# Patient Record
Sex: Male | Born: 1970
Health system: Southern US, Community
[De-identification: ages and names within clinical notes are randomized; demographics above are authoritative.]

## PROBLEM LIST (undated history)

## (undated) DIAGNOSIS — Z87438 Personal history of other diseases of male genital organs: Secondary | ICD-10-CM

## (undated) DIAGNOSIS — C801 Malignant (primary) neoplasm, unspecified: Secondary | ICD-10-CM

## (undated) DIAGNOSIS — J302 Other seasonal allergic rhinitis: Secondary | ICD-10-CM

## (undated) DIAGNOSIS — R112 Nausea with vomiting, unspecified: Secondary | ICD-10-CM

## (undated) DIAGNOSIS — Z8052 Family history of malignant neoplasm of bladder: Secondary | ICD-10-CM

## (undated) DIAGNOSIS — Z9889 Other specified postprocedural states: Secondary | ICD-10-CM

## (undated) DIAGNOSIS — Z807 Family history of other malignant neoplasms of lymphoid, hematopoietic and related tissues: Secondary | ICD-10-CM

## (undated) HISTORY — DX: Personal history of other diseases of male genital organs: Z87.438

## (undated) HISTORY — DX: Family history of malignant neoplasm of bladder: Z80.52

## (undated) HISTORY — DX: Other seasonal allergic rhinitis: J30.2

## (undated) HISTORY — PX: VASECTOMY: SHX75

## (undated) HISTORY — PX: COLONOSCOPY: SHX174

## (undated) HISTORY — DX: Family history of other malignant neoplasms of lymphoid, hematopoietic and related tissues: Z80.7

## (undated) HISTORY — PX: WISDOM TOOTH EXTRACTION: SHX21

---

## 2004-07-31 ENCOUNTER — Ambulatory Visit: Payer: Self-pay | Admitting: Internal Medicine

## 2007-06-06 ENCOUNTER — Ambulatory Visit: Payer: Self-pay | Admitting: Internal Medicine

## 2008-04-30 ENCOUNTER — Ambulatory Visit: Payer: Self-pay | Admitting: Internal Medicine

## 2008-05-03 LAB — CONVERTED CEMR LAB
ALT: 20 units/L (ref 0–53)
AST: 20 units/L (ref 0–37)
Albumin: 4.6 g/dL (ref 3.5–5.2)
Alkaline Phosphatase: 55 units/L (ref 39–117)
BUN: 12 mg/dL (ref 6–23)
Basophils Absolute: 0 10*3/uL (ref 0.0–0.1)
Basophils Relative: 0.1 % (ref 0.0–3.0)
Bilirubin, Direct: 0.1 mg/dL (ref 0.0–0.3)
CO2: 30 meq/L (ref 19–32)
Calcium: 9.8 mg/dL (ref 8.4–10.5)
Chloride: 104 meq/L (ref 96–112)
Cholesterol: 158 mg/dL (ref 0–200)
Creatinine, Ser: 0.9 mg/dL (ref 0.4–1.5)
Eosinophils Absolute: 0.2 10*3/uL (ref 0.0–0.7)
Eosinophils Relative: 4.6 % (ref 0.0–5.0)
GFR calc Af Amer: 123 mL/min
GFR calc non Af Amer: 101 mL/min
Glucose, Bld: 94 mg/dL (ref 70–99)
HCT: 44.5 % (ref 39.0–52.0)
HDL: 32.4 mg/dL — ABNORMAL LOW (ref >39.0)
Hemoglobin: 15.6 g/dL (ref 13.0–17.0)
LDL Cholesterol: 109 mg/dL — ABNORMAL HIGH (ref 0–99)
Lymphocytes Relative: 35.6 % (ref 12.0–46.0)
MCHC: 35.1 g/dL (ref 30.0–36.0)
MCV: 88.7 fL (ref 78.0–100.0)
Monocytes Absolute: 0.4 10*3/uL (ref 0.1–1.0)
Monocytes Relative: 8 % (ref 3.0–12.0)
Neutro Abs: 2.4 10*3/uL (ref 1.4–7.7)
Neutrophils Relative %: 51.7 % (ref 43.0–77.0)
Platelets: 213 10*3/uL (ref 150–400)
Potassium: 4.4 meq/L (ref 3.5–5.1)
RBC: 5.02 M/uL (ref 4.22–5.81)
RDW: 11.7 % (ref 11.5–14.6)
Sodium: 140 meq/L (ref 135–145)
TSH: 1.44 microintl units/mL (ref 0.35–5.50)
Total Bilirubin: 0.9 mg/dL (ref 0.3–1.2)
Total CHOL/HDL Ratio: 4.9
Total Protein: 7.4 g/dL (ref 6.0–8.3)
Triglycerides: 83 mg/dL (ref 0–149)
VLDL: 17 mg/dL (ref 0–40)
WBC: 4.7 10*3/uL (ref 4.5–10.5)

## 2008-05-04 ENCOUNTER — Encounter (INDEPENDENT_AMBULATORY_CARE_PROVIDER_SITE_OTHER): Payer: Self-pay | Admitting: *Deleted

## 2009-05-05 ENCOUNTER — Ambulatory Visit: Payer: Self-pay | Admitting: Internal Medicine

## 2009-05-05 DIAGNOSIS — IMO0002 Reserved for concepts with insufficient information to code with codable children: Secondary | ICD-10-CM | POA: Insufficient documentation

## 2009-09-03 DIAGNOSIS — Z87438 Personal history of other diseases of male genital organs: Secondary | ICD-10-CM

## 2009-09-03 HISTORY — DX: Personal history of other diseases of male genital organs: Z87.438

## 2010-02-20 ENCOUNTER — Ambulatory Visit: Payer: Self-pay | Admitting: Internal Medicine

## 2010-02-20 DIAGNOSIS — G479 Sleep disorder, unspecified: Secondary | ICD-10-CM | POA: Insufficient documentation

## 2010-02-24 ENCOUNTER — Ambulatory Visit: Payer: Self-pay | Admitting: Internal Medicine

## 2010-02-24 DIAGNOSIS — R1032 Left lower quadrant pain: Secondary | ICD-10-CM | POA: Insufficient documentation

## 2010-02-24 DIAGNOSIS — N453 Epididymo-orchitis: Secondary | ICD-10-CM | POA: Insufficient documentation

## 2010-02-24 LAB — CONVERTED CEMR LAB
Bilirubin Urine: NEGATIVE
Blood in Urine, dipstick: NEGATIVE
Glucose, Urine, Semiquant: NEGATIVE
Ketones, urine, test strip: NEGATIVE
Nitrite: NEGATIVE
Protein, U semiquant: NEGATIVE
Specific Gravity, Urine: 1.01
Urobilinogen, UA: 0.2
WBC Urine, dipstick: NEGATIVE
pH: 6

## 2010-03-01 ENCOUNTER — Telehealth (INDEPENDENT_AMBULATORY_CARE_PROVIDER_SITE_OTHER): Payer: Self-pay | Admitting: *Deleted

## 2010-03-02 ENCOUNTER — Encounter: Admission: RE | Admit: 2010-03-02 | Discharge: 2010-03-02 | Payer: Self-pay | Admitting: Internal Medicine

## 2010-03-02 IMAGING — US US SCROTUM
1 series · 14 of 25 positions shown · non-contrast
Comparison: None.

CLINICAL DATA: History of left epididymitis, currently on
antibiotics

ULTRASOUND OF SCROTUM
TECHNIQUE: Complete ultrasound examination of the testicles,
epididymis, and other scrotal structures was performed.

[Series 1: us scrotum · 0.08mm/px · 14 of 33 slices shown]
[im 1/33]
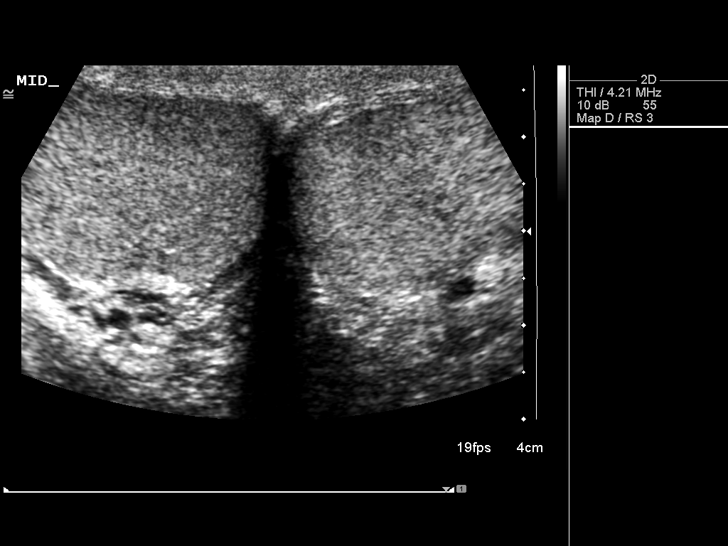
[im 3/33]
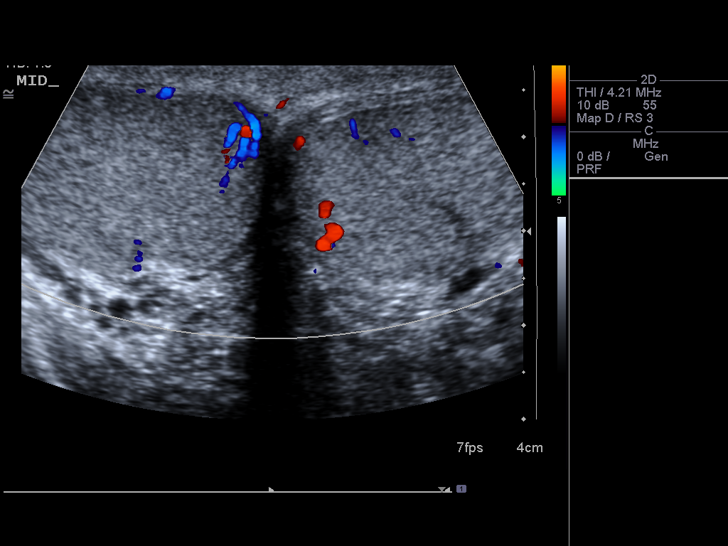
[im 6/33]
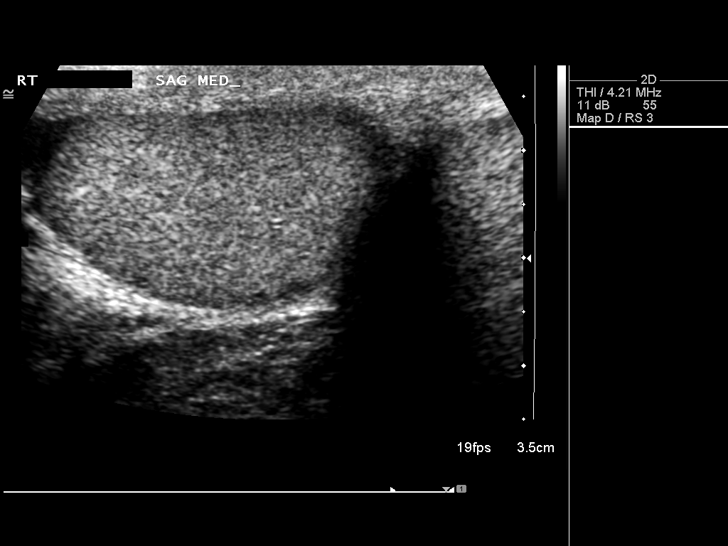
[im 9/33]
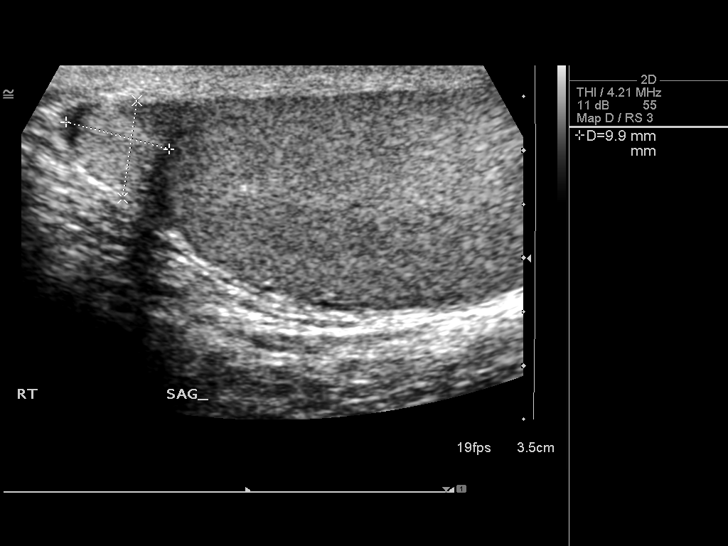
[im 11/33]
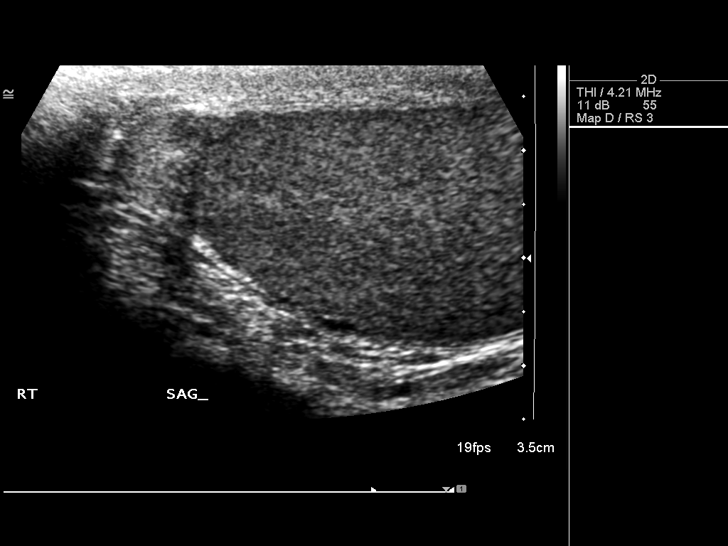
[im 13/33]
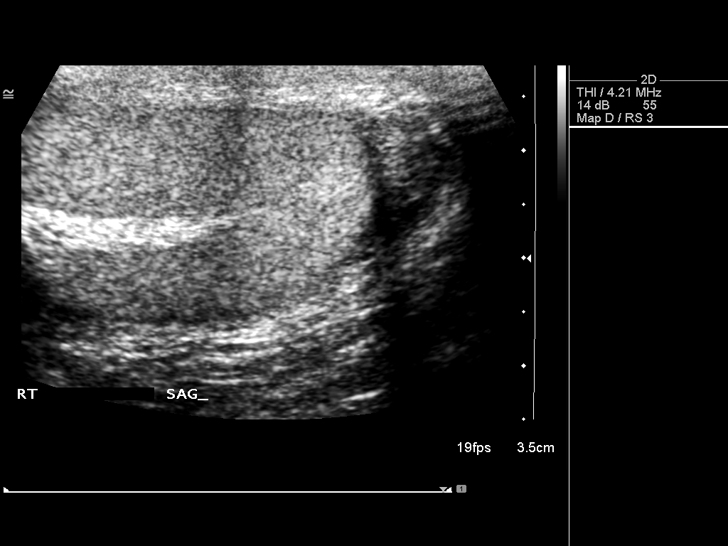
[im 15/33]
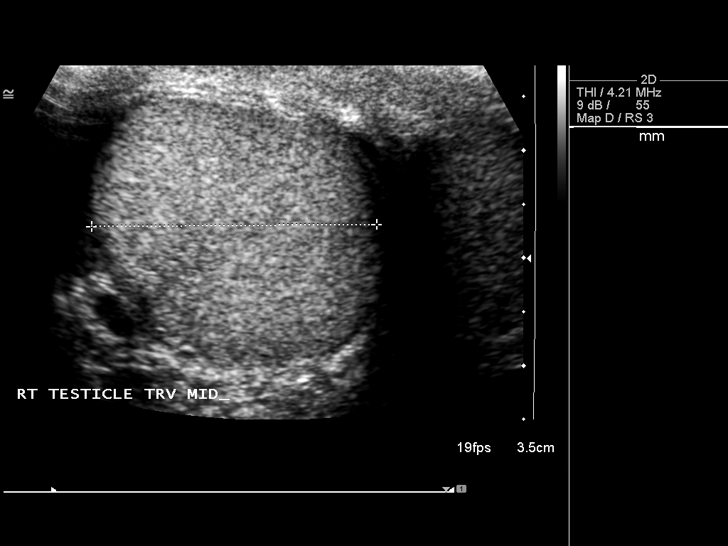
[im 18/33]
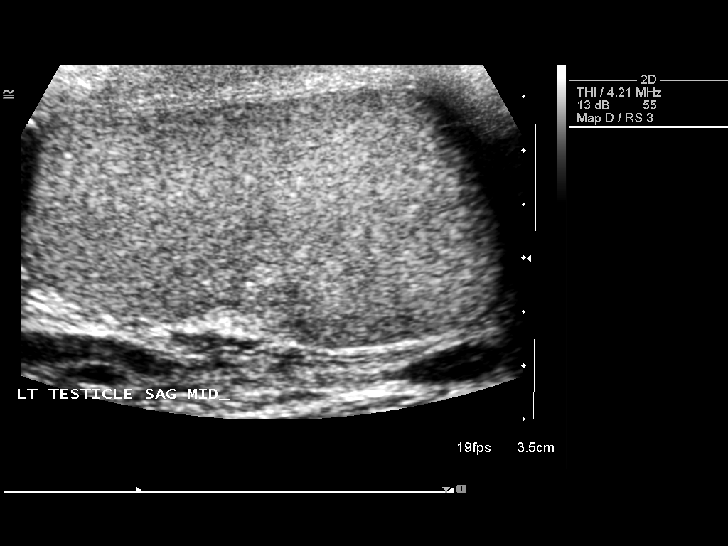
[im 21/33]
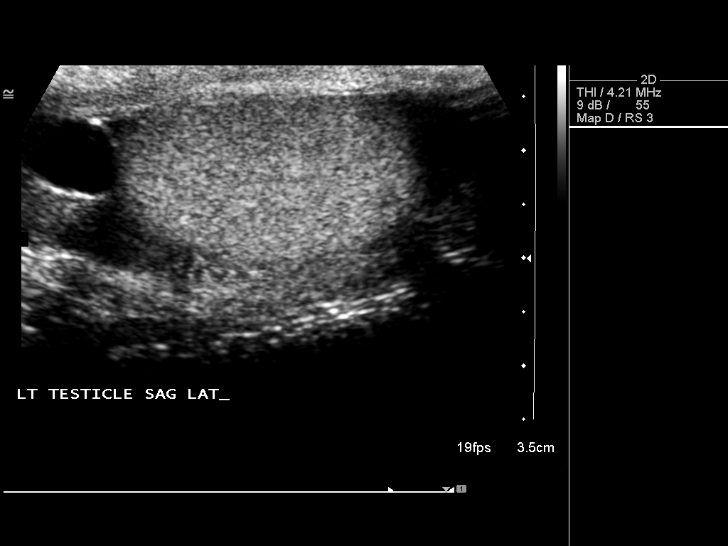
[im 22/33]
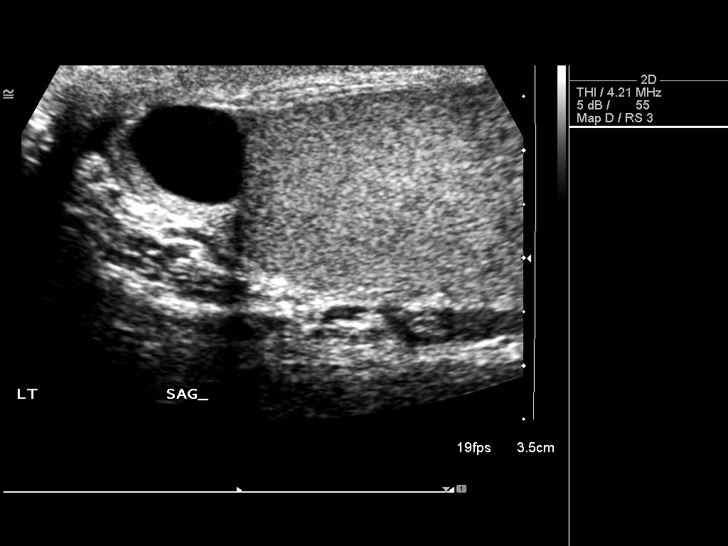
[im 25/33]
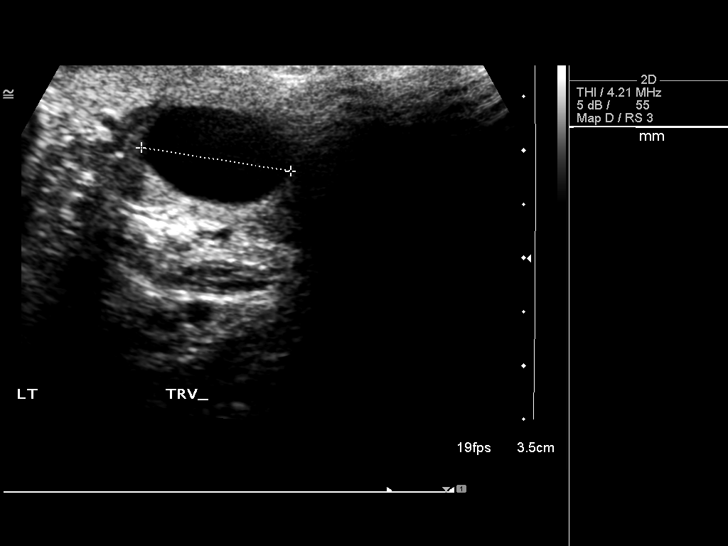
[im 27/33]
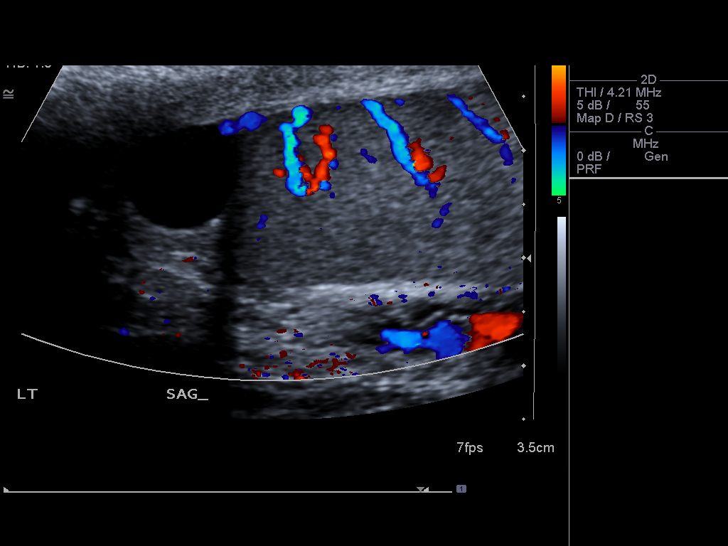
[im 30/33]
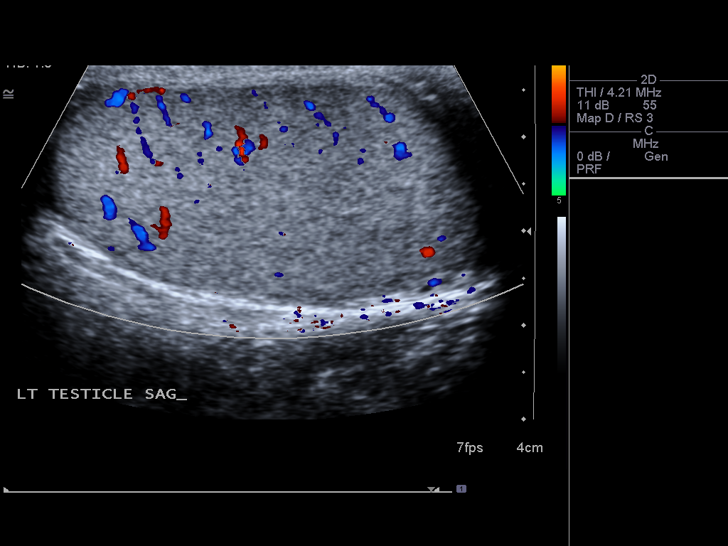
[im 33/33]
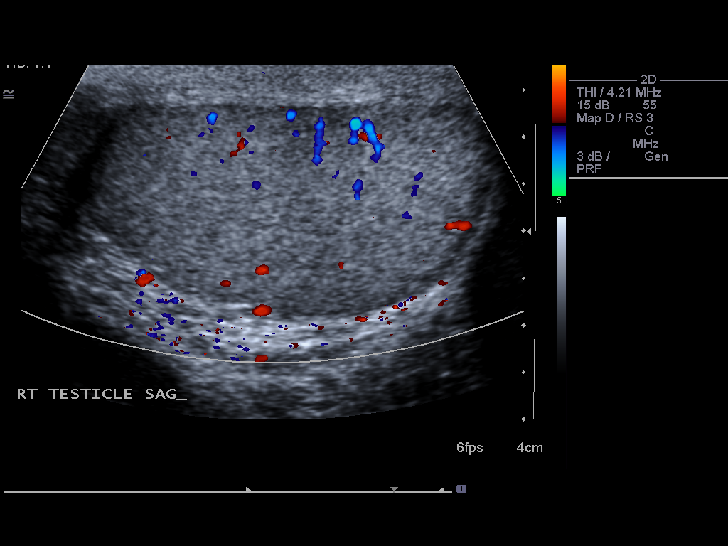

[14 of 25 positions shown; findings below may reference images not displayed]

FINDINGS: The testicles are normal in size.  No intratesticular
abnormality is seen.  There is blood flow to both testicles.  The
epididymis appears normal bilaterally with a left epididymal cyst
of 12 x 9 x 14 mm noted.  No hydrocele or varicocele is seen.
IMPRESSION: 1.  No intratesticular abnormality.  There is blood flow to both
testicles.
2.  12 x 9 x 14 mm left epididymal cyst.  No ultrasound evidence of
epididymitis currently.

## 2010-03-02 IMAGING — US US SCROTUM
1 series · 3 of 3 positions shown · non-contrast
Comparison: None.

CLINICAL DATA: History of left epididymitis, currently on
antibiotics

ULTRASOUND OF SCROTUM
TECHNIQUE: Complete ultrasound examination of the testicles,
epididymis, and other scrotal structures was performed.

[Series 1: us scrotum · 0.08mm/px · 3 of 3 slices shown]
[im 1/3]
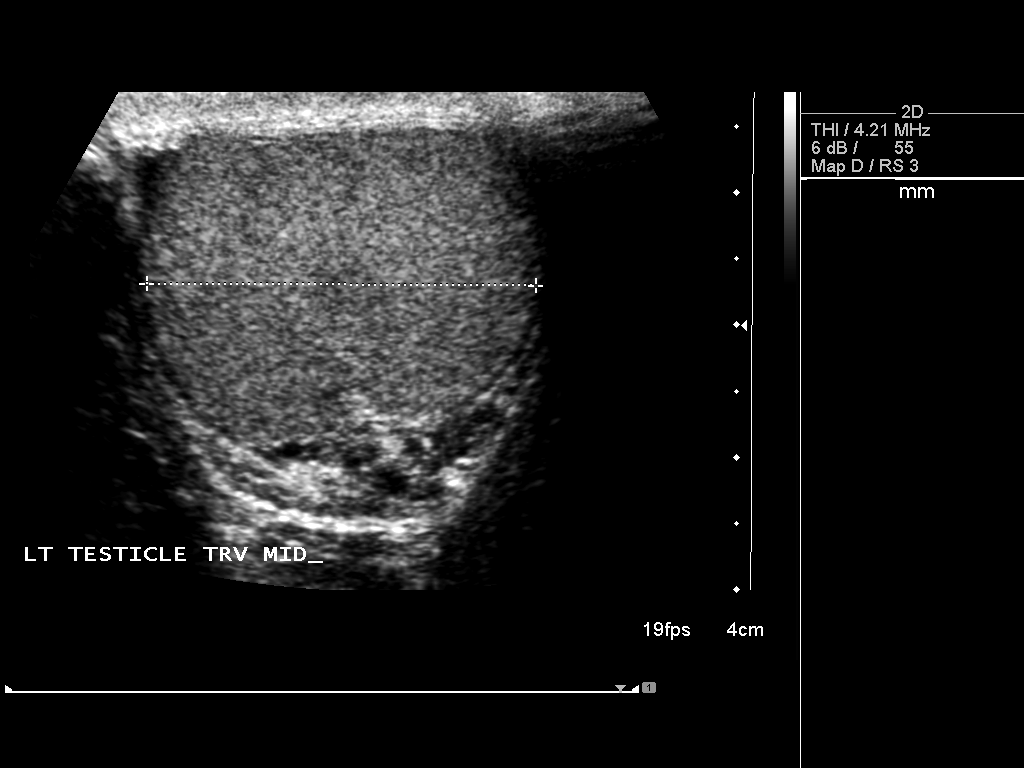
[im 2/3]
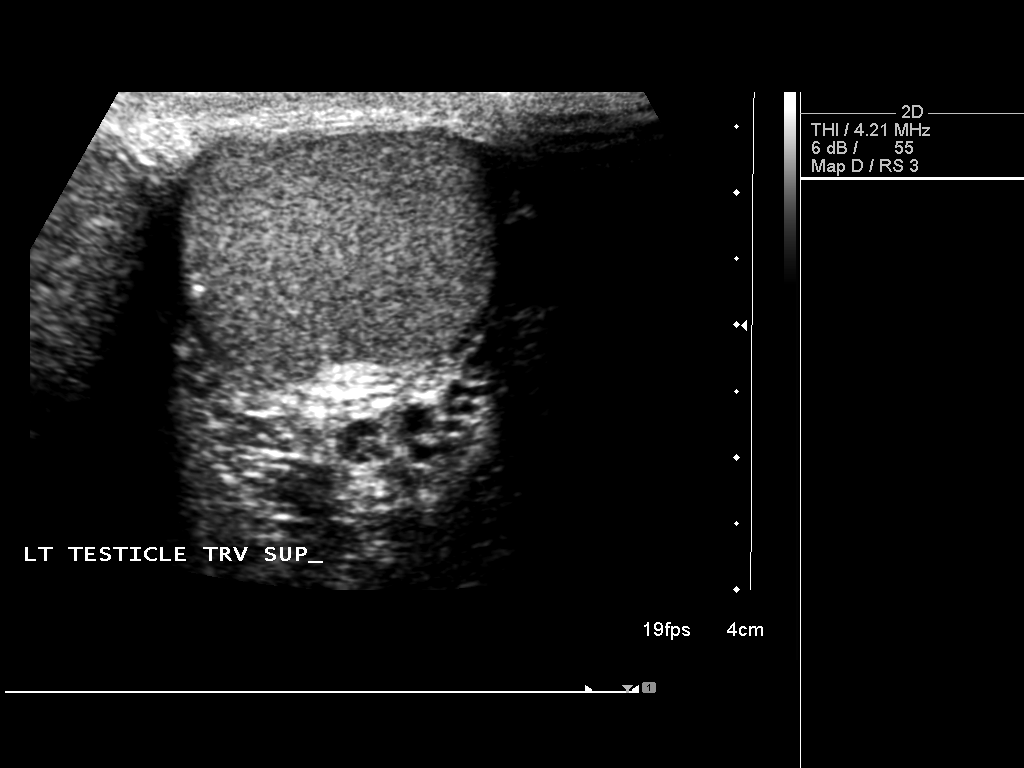
[im 3/3]
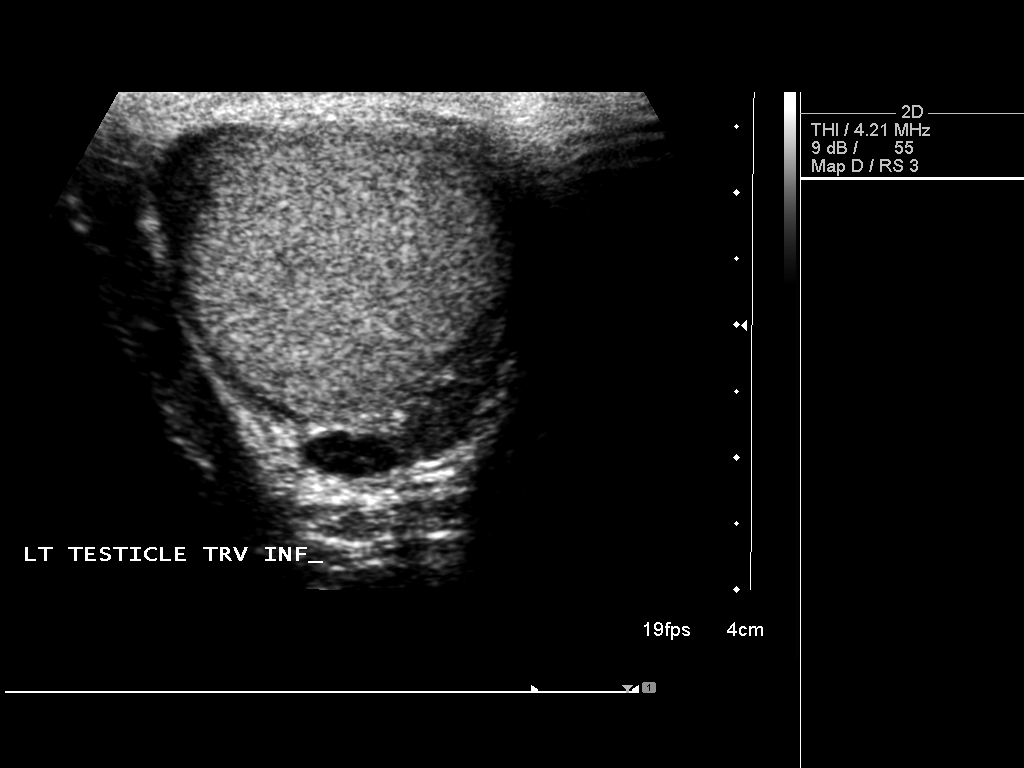

[3 of 3 positions shown; findings below may reference images not displayed]

FINDINGS: The testicles are normal in size.  No intratesticular
abnormality is seen.  There is blood flow to both testicles.  The
epididymis appears normal bilaterally with a left epididymal cyst
of 12 x 9 x 14 mm noted.  No hydrocele or varicocele is seen.
IMPRESSION: 1.  No intratesticular abnormality.  There is blood flow to both
testicles.
2.  12 x 9 x 14 mm left epididymal cyst.  No ultrasound evidence of
epididymitis currently.

## 2010-03-07 ENCOUNTER — Ambulatory Visit: Payer: Self-pay | Admitting: Internal Medicine

## 2010-03-21 ENCOUNTER — Telehealth (INDEPENDENT_AMBULATORY_CARE_PROVIDER_SITE_OTHER): Payer: Self-pay | Admitting: *Deleted

## 2010-03-23 ENCOUNTER — Encounter: Payer: Self-pay | Admitting: Internal Medicine

## 2010-03-28 ENCOUNTER — Encounter: Payer: Self-pay | Admitting: Internal Medicine

## 2010-03-31 ENCOUNTER — Ambulatory Visit: Payer: Self-pay | Admitting: Internal Medicine

## 2010-03-31 DIAGNOSIS — IMO0001 Reserved for inherently not codable concepts without codable children: Secondary | ICD-10-CM | POA: Insufficient documentation

## 2010-03-31 DIAGNOSIS — R21 Rash and other nonspecific skin eruption: Secondary | ICD-10-CM | POA: Insufficient documentation

## 2010-03-31 DIAGNOSIS — F411 Generalized anxiety disorder: Secondary | ICD-10-CM | POA: Insufficient documentation

## 2010-03-31 DIAGNOSIS — R109 Unspecified abdominal pain: Secondary | ICD-10-CM | POA: Insufficient documentation

## 2010-04-05 LAB — CONVERTED CEMR LAB
ALT: 22 units/L (ref 0–53)
AST: 21 units/L (ref 0–37)
Albumin: 5 g/dL (ref 3.5–5.2)
Alkaline Phosphatase: 65 units/L (ref 39–117)
BUN: 13 mg/dL (ref 6–23)
Basophils Absolute: 0.1 10*3/uL (ref 0.0–0.1)
Basophils Relative: 1 % (ref 0–1)
Bilirubin, Direct: 0.1 mg/dL (ref 0.0–0.3)
CO2: 22 meq/L (ref 19–32)
Calcium: 9.6 mg/dL (ref 8.4–10.5)
Chloride: 103 meq/L (ref 96–112)
Creatinine, Ser: 1.13 mg/dL (ref 0.40–1.50)
Eosinophils Absolute: 0.2 10*3/uL (ref 0.0–0.7)
Eosinophils Relative: 4 % (ref 0–5)
Glucose, Bld: 95 mg/dL (ref 70–99)
HCT: 43.2 % (ref 39.0–52.0)
Hemoglobin: 14.8 g/dL (ref 13.0–17.0)
Indirect Bilirubin: 0.3 mg/dL (ref 0.0–0.9)
Lymphocytes Relative: 25 % (ref 12–46)
Lymphs Abs: 1.3 10*3/uL (ref 0.7–4.0)
MCHC: 34.3 g/dL (ref 30.0–36.0)
MCV: 85.4 fL (ref 78.0–100.0)
Monocytes Absolute: 0.4 10*3/uL (ref 0.1–1.0)
Monocytes Relative: 7 % (ref 3–12)
Neutro Abs: 3.4 10*3/uL (ref 1.7–7.7)
Neutrophils Relative %: 64 % (ref 43–77)
Platelets: 233 10*3/uL (ref 150–400)
Potassium: 4.3 meq/L (ref 3.5–5.3)
RBC: 5.06 M/uL (ref 4.22–5.81)
RDW: 12.8 % (ref 11.5–15.5)
Sed Rate: 2 mm/hr (ref 0–16)
Sodium: 139 meq/L (ref 135–145)
TSH: 1.766 microintl units/mL (ref 0.350–4.500)
Total Bilirubin: 0.4 mg/dL (ref 0.3–1.2)
Total CK: 55 units/L (ref 7–232)
Total Protein: 7.3 g/dL (ref 6.0–8.3)
Vit D, 25-Hydroxy: 40 ng/mL (ref 30–89)
WBC: 5.3 10*3/uL (ref 4.0–10.5)

## 2010-04-07 ENCOUNTER — Encounter: Payer: Self-pay | Admitting: Internal Medicine

## 2010-06-26 ENCOUNTER — Telehealth: Payer: Self-pay | Admitting: Internal Medicine

## 2010-10-03 NOTE — Assessment & Plan Note (Signed)
Summary: CPX//PH   Vital Signs:  Patient Profile:   40 Years Old Male Height:     67.5 inches Weight:      165 pounds Temp:     98.2 degrees F oral Pulse rate:   72 / minute Resp:     16 per minute BP sitting:   120 / 80  (left arm) Cuff size:   large  Pt. in pain?   no  Vitals Entered By: Jeremy Johann CMA (April 30, 2008 10:42 AM)                   Chief Complaint:  CPX WITH FASTING LABS (PATIENT HAD 1 ANIMAL CRACKER-? IF OK TO CHECK LABS).  General Medical HPI:        Currently he is doing well without any significant new complaints.    Current Allergies (reviewed today): ! PCN ! Pollyann Samples  Past Medical History:    Reviewed history from 06/06/2007 and no changes required:       Unremarkable  Past Surgical History:    Reviewed history from 06/06/2007 and no changes required:       Oral surgery; fractured nose wrestling   Family History:    Father: HTN    Mother: CAD, stent @ 30    Siblings: neg    PGF MI ; PGGM CVA  Social History:    Reviewed history from 06/06/2007 and no changes required:       no diet   Risk Factors:  Exercise:  yes    Times per week:  2    Type:  treadmill X 20 min w/o symptoms   Review of Systems  General      Denies chills, fatigue, fever, sleep disorder, sweats, and weight loss.  Eyes      Denies blurring, double vision, and vision loss-both eyes.  ENT      Denies decreased hearing, difficulty swallowing, ear discharge, earache, hoarseness, nasal congestion, ringing in ears, and sinus pressure.      "Swimmer's ear " this Summer after beach trip  CV      Denies bluish discoloration of lips or nails, chest pain or discomfort, difficulty breathing at night, difficulty breathing while lying down, leg cramps with exertion, palpitations, shortness of breath with exertion, swelling of feet, and swelling of hands.  Resp      Denies cough, excessive snoring, hypersomnolence, morning headaches, shortness of breath, and  sputum productive.  GI      Denies abdominal pain, bloody stools, constipation, dark tarry stools, diarrhea, indigestion, nausea, and vomiting.  GU      Denies discharge, hematuria, nocturia, and urinary frequency.  MS      Complains of low back pain.      Denies joint pain, joint redness, joint swelling, mid back pain, muscle aches, muscle weakness, and thoracic pain.      LBS  related to hiking; as needed NSAIDS  Derm      Denies changes in nail beds, dryness, hair loss, lesion(s), and rash.  Neuro      Denies disturbances in coordination, numbness, poor balance, sensation of room spinning, and tingling.  Psych      Denies anxiety, depression, easily angered, easily tearful, and irritability.  Endo      Denies cold intolerance, excessive hunger, excessive thirst, excessive urination, heat intolerance, polyuria, and weight change.  Heme      Denies abnormal bruising and bleeding.  Allergy      Complains  of seasonal allergies.      Denies itching eyes and sneezing.      Pollen related congestion   Physical Exam  General:     well-nourished,in no acute distress; alert,appropriate and cooperative throughout examination Head:     Normocephalic and atraumatic without obvious abnormalities. No apparent alopecia or balding. Eyes:     No corneal or conjunctival inflammation noted.  Perrla. Funduscopic exam benign, without hemorrhages, exudates or papilledema. Ears:     External ear exam shows no significant lesions or deformities.  Otoscopic examination reveals clear canals, tympanic membranes are intact bilaterally without bulging, retraction, inflammation or discharge. Hearing is grossly normal bilaterally. Nose:     External nasal examination shows no deformity or inflammation. Nasal mucosa are pink and moist without lesions or exudates. Mouth:     Oral mucosa and oropharynx without lesions or exudates.  Teeth in good repair. Neck:     No deformities, masses, or  tenderness noted. Lungs:     Normal respiratory effort, chest expands symmetrically. Lungs are clear to auscultation, no crackles or wheezes. Heart:     Normal rate and regular rhythm. S1 and S2 normal without gallop, murmur, click, rub or other extra sounds. Abdomen:     Bowel sounds positive,abdomen soft and non-tender without masses, organomegaly or hernias noted. Rectal:     No external abnormalities noted. Normal sphincter tone. No rectal masses or tenderness.FOB neg Genitalia:     Testes bilaterally descended without nodularity, tenderness or masses. No scrotal masses or lesions. No penis lesions or urethral discharge. Prostate:     Suboptimal exam due to discomfort Msk:     No deformity or scoliosis noted of thoracic or lumbar spine.   Pulses:     R and L carotid,radial,dorsalis pedis and posterior tibial pulses are full and equal bilaterally Extremities:     No clubbing, cyanosis, edema, or deformity noted with normal full range of motion of all joints.   Neurologic:     alert & oriented X3 and DTRs symmetrical and normal.   Skin:     Intact without suspicious lesions or rashes Cervical Nodes:     No lymphadenopathy noted Axillary Nodes:     No palpable lymphadenopathy Psych:     memory intact for recent and remote, normally interactive, good eye contact, not anxious appearing, and not depressed appearing.      Impression & Recommendations:  Problem # 1:  ROUTINE GENERAL MEDICAL EXAM@[email or web address]  CARE FACL (ICD-V70.0)  Orders: Venipuncture ([address]) TLB-Lipid Panel (80061-LIPID) TLB-BMP (Basic Metabolic Panel-BMET) (80048-METABOL) TLB-CBC Platelet - w/Differential (85025-CBCD) TLB-Hepatic/Liver Function Pnl (80076-HEPATIC) TLB-TSH (Thyroid Stimulating Hormone) (84443-TSH)   Other Orders: Tdap => 38yrs IM (60454) Admin 1st Vaccine (09811)   Patient Instructions: 1)  Please keep these records in home file   ]  Tetanus/Td Vaccine    Vaccine Type: Tdap    Site:  right deltoid    Mfr: Sanofi Pasteur    Dose: 0.5 ml    Route: IM    Given by: Jeremy Johann CMA    Exp. Date: 04/10/2009    Lot #: [id number]    VIS given: 07/22/07 version given April 30, 2008.    Appended Document: CPX//PH  Laboratory Results   Urine Tests   Date/Time Reported: April 30, 2008 11:47 AM   Routine Urinalysis   Color: yellow Appearance: Clear Glucose: negative   (Normal Range: Negative) Bilirubin: negative   (Normal Range: Negative) Ketone: negative   (Normal Range: Negative) Spec. Gravity: <1.005   (  Normal Range: 1.003-1.035) Blood: negative   (Normal Range: Negative) pH: 6.5   (Normal Range: 5.0-8.0) Protein: negative   (Normal Range: Negative) Urobilinogen: negative   (Normal Range: 0-1) Nitrite: negative   (Normal Range: Negative) Leukocyte Esterace: negative   (Normal Range: Negative)    CommentsFloydene Flock CMA  April 30, 2008 11:48 AM

## 2010-10-03 NOTE — Consult Note (Signed)
Summary: [facility]  [facility]   Imported By: Lanelle Bal 04/17/2010 12:04:55  _____________________________________________________________________  External Attachment:    Type:   Image     Comment:   External Document

## 2010-10-03 NOTE — Progress Notes (Signed)
Summary: Cat bite  Phone Note Call from Patient Call back at Home Phone [phone]   Caller: Cesar Herrera [phone] Summary of Call: Patient spouse left message on triage that he was bitten by a stray kitten and would like to know if he should come in for office visit.  Patient notes that the area on his finger is not painful, red, swollen, or appears to be infected. Patient states "feels like he had been pricked by a thorn". Patient was advised that his TD is up to date and he should practice infection prevention. He will call back for appt if needed. Initial call taken by: Lucious Groves CMA,  June 26, 2010 10:07 AM  Follow-up for Phone Call        report fever, red streaks up hand or pus Follow-up by: Marga Melnick MD,  June 26, 2010 12:46 PM  Additional Follow-up for Phone Call Additional follow up Details #1::        Patient aware via conversation earlier.  Additional Follow-up by: Lucious Groves CMA,  June 26, 2010 3:11 PM

## 2010-10-03 NOTE — Letter (Signed)
Summary: Results Follow up Letter  [facility]  [address] [place], [place] [address]   Phone: [phone]  Fax: [phone]    05/04/2008 MRN: [id number]  GRAE CANNATA [address] [place], [place]  [address]  Dear Mr. HUTMACHER,  The following are the results of your recent test(s):  Test         Result    Pap Smear:        Normal _____  Not Normal _____ Comments: ______________________________________________________ Cholesterol: LDL(Bad cholesterol):         Your goal is less than:         HDL (Good cholesterol):       Your goal is more than: Comments:  ______________________________________________________ Mammogram:        Normal _____  Not Normal _____ Comments:  ___________________________________________________________________ Hemoccult:        Normal _____  Not normal _______ Comments:    _____________________________________________________________________ Other Tests:  PLEASE SEE ATTACHED LABS AND COMMENTS  We routinely do not discuss normal results over the telephone.  If you desire a copy of the results, or you have any questions about this information we can discuss them at your next office visit.   Sincerely,

## 2010-10-03 NOTE — Assessment & Plan Note (Signed)
Summary: nausea and diarhea//kn   Vital Signs:  Patient profile:   40 year old male Weight:      169.4 pounds BMI:     26.23 Temp:     98.1 degrees F oral Pulse rate:   70 / minute Resp:     15 per minute BP sitting:   124 / 80  (left arm) Cuff size:   large  Vitals Entered By: Shonna Chock (February 20, 2010 4:31 PM) CC: Nausea and Diarrhea off/on x 1 week-? stress related   CC:  Nausea and Diarrhea off/on x 1 week-? stress related.  History of Present Illness: "Faint" nausea & intermittent loose stool, but  not frank diarrhea over weekend . These are improving, but his wife's concern is possible  work related anxiety  issues . decreased CVE due to work schedule.  Allergies: 1)  ! Pcn 2)  ! Pollyann Samples  Family History: Father: HTN Mother: CAD, stent @ 26 Siblings: negative PGF: MI ; PGGM :CVA  Review of Systems General:  Complains of fatigue and sleep disorder. CV:  Denies palpitations. GI:  Denies constipation. Derm:  Denies changes in nail beds, dryness, and hair loss. Neuro:  Denies numbness, tingling, and tremors. Psych:  Complains of anxiety, easily angered, and irritability; denies depression, easily tearful, and panic attacks. Endo:  Denies cold intolerance and heat intolerance.  Physical Exam  General:  well-nourished; alert,appropriate and cooperative throughout examination Eyes:  No corneal or conjunctival inflammation noted. Perrla.No definite lid lag Heart:  Normal rate and regular rhythm. S1 and S2 normal without gallop, murmur, click, rub or other extra sounds. Neurologic:  alert & oriented X3 and DTRs symmetrical and normal.  No tremor Skin:  Intact without suspicious lesions or rashes Psych:  memory intact for recent and remote, normally interactive, good eye contact, not anxious appearing, and not depressed appearing.     Impression & Recommendations:  Problem # 1:  SLEEP DISORDER (ICD-780.50) ? anxiety variant   Complete Medication List: 1)   Fluoxetine Hcl 10 Mg Caps (Fluoxetine hcl) .Marland Kitchen.. 1daily 2)  Lorazepam 0.5 Mg Tabs (Lorazepam) .Marland Kitchen.. 1 every 8 hrs as needed  Patient Instructions: 1)  Consider all 4 options we discussed, including  Don't Sweat The Small Stuff. Prescriptions: LORAZEPAM 0.5 MG TABS (LORAZEPAM) 1 every 8 hrs as needed  #30 x 2   Entered and Authorized by:   Marga Melnick MD   Signed by:   Marga Melnick MD on 02/20/2010   Method used:   Print then Give to Patient   RxID:   [id number] FLUOXETINE HCL 10 MG CAPS (FLUOXETINE HCL) 1daily  #30 x 5   Entered and Authorized by:   Marga Melnick MD   Signed by:   Marga Melnick MD on 02/20/2010   Method used:   Print then Give to Patient   RxID:   [id number]

## 2010-10-03 NOTE — Progress Notes (Signed)
Summary: still no better  Phone Note Call from Patient   Caller: Patient Summary of Call: pt call back stating despite meds symptom have not resolved.  Pt states that symptoms did better for a while but have now returned.Pt has not been able to do Sitz baths two times a day as needed due to his schedule . Pt advise to try to include baths in his schedule and to continue with meds. Pt informed per dr hopper instruction if no better [place] of scrotum. pt aware orders have been put in just awaiting appt info

## 2010-10-03 NOTE — Assessment & Plan Note (Signed)
Summary: still no better, discuss U/S,fd--Rm 4   Vital Signs:  Patient profile:   40 year old male Height:      67.5 inches Weight:      167.38 pounds BMI:     25.92 Temp:     98.4 degrees F oral Pulse rate:   84 / minute Pulse rhythm:   regular Resp:     14 per minute BP sitting:   104 / 70  (left arm) Cuff size:   large  Vitals Entered By: Mervin Kung CMA (AAMA) (March 07, 2010 12:00 PM) CC: Room 4  Pt states pain is improved on new medication. Is Patient Diabetic? No   CC:  Room 4  Pt states pain is improved on new medication.Marland Kitchen  History of Present Illness: He is improved on Doxycycline X 4 days, but he has minimal residual mild pain in L medial thigh. Testicular pain has essentially  resolved; "decreased from 100 % to 10-15 %. [place] reviewed & copy provided  Allergies: 1)  ! Pcn 2)  ! * Emycin  Review of Systems General:  Denies chills, fever, and sweats. GU:  Denies discharge, dysuria, and hematuria. Derm:  Denies lesion(s) and rash. Neuro:  Denies numbness and tingling.  Physical Exam  General:  well-nourished,in no acute distress; alert,appropriate and cooperative throughout examination Abdomen:  Bowel sounds positive,abdomen soft and non-tender without masses, organomegaly or hernias noted. Genitalia:  Testes bilaterally descended without nodularity, tenderness or masses. Scrotal  epididymal swelling on L. No penis lesions or urethral discharge. Skin:  Intact without suspicious lesions or rashes Inguinal Nodes:  No significant adenopathy   Impression & Recommendations:  Problem # 1:  EPIDIDYMITIS, LEFT (ICD-604.90) resolving clinically  Complete Medication List: 1)  Fluoxetine Hcl 10 Mg Caps (Fluoxetine hcl) .Marland Kitchen.. 1daily 2)  Lorazepam 0.5 Mg Tabs (Lorazepam) .Marland Kitchen.. 1 every 8 hrs as needed 3)  Ciprofloxacin Hcl 500 Mg Tabs (Ciprofloxacin hcl) .Marland Kitchen.. 1 two times a day 4)  Hydrocodone-acetaminophen 5-500 Mg Tabs (Hydrocodone-acetaminophen) .Marland Kitchen.. 1 every 4-6 hrs as  needed for pain 5)  Doxycycline Hyclate 100 Mg Solr (Doxycycline hyclate) .... Two times a day, but must avoid direct sun for extended periods to prevent photosensitivity  Patient Instructions: 1)  Avoid excess sun while on Doxycycline. Urology consult if symptoms recur or progress. Monthly  self exams as discussed. Prescriptions: DOXYCYCLINE HYCLATE 100 MG SOLR (DOXYCYCLINE HYCLATE) two times a day, but must avoid direct sun for extended periods to prevent photosensitivity  #14 x 0   Entered and Authorized by:   Marga Melnick MD   Signed by:   Marga Melnick MD on 03/07/2010   Method used:   Print then Give to Patient   RxID:   [id number]   Current Allergies (reviewed today): ! PCN ! [facility]

## 2010-10-03 NOTE — Assessment & Plan Note (Signed)
Summary: PULLED MUSCLE IN BACK VERY PAINFUL/RH.....   Vital Signs:  Patient profile:   40 year old male Weight:      167 pounds BMI:     25.86 Temp:     98.5 degrees F oral Pulse rate:   72 / minute Resp:     14 per minute BP sitting:   112 / 78  (left arm) Cuff size:   large  Vitals Entered By: Shonna Chock (May 05, 2009 10:47 AM) CC: RIGHT/CENTER AREA OF BACK-PULLED MUSCLE, UNABLE TO SLEEP LAST PPM DUE TO THE PAIN Comments NO CURRENT MEDS    CC:  RIGHT/CENTER AREA OF BACK-PULLED MUSCLE and UNABLE TO SLEEP LAST PPM DUE TO THE PAIN.  History of Present Illness: Acute onset  of sharp , R infrascapular pain 05/04/2009 getting out of chair. PMH of LBP; no surgery  or injury. Rx: NSAIDS w/o benefit until this am.  Allergies: 1)  ! Pcn 2)  ! * Emycin  Review of Systems General:  Denies chills, fever, and sweats. GI:  Denies abdominal pain and change in bowel habits. GU:  Denies discharge, dysuria, and hematuria.  Physical Exam  General:  Uncomfortable but in no acute distress; alert,appropriate and cooperative throughout examination Abdomen:  Bowel sounds positive,abdomen soft and non-tender without masses, organomegaly or hernias noted. Msk:  Asymmmetry of thoracic muscles Extremities:  Neg SLR bilaterally Neurologic:  alert & oriented X3, strength normal in all extremities,heel/toe  gait normal, and DTRs symmetrical and normal.   Skin:  Intact without suspicious lesions or rashes Cervical Nodes:  No lymphadenopathy noted Axillary Nodes:  No palpable lymphadenopathy   Impression & Recommendations:  Problem # 1:  SPRAIN AND STRAIN OF UNSPECIFIED SITE OF BACK (ICD-847.9) @ T# level, probably due to Mild Scoliosis  Complete Medication List: 1)  Tramadol Hcl 50 Mg Tabs (Tramadol hcl) .Marland Kitchen.. 1-2 q 6 hrs as needed 2)  Cyclobenzaprine Hcl 5 Mg Tabs (Cyclobenzaprine hcl) .Marland Kitchen.. 1 two times a day & 2 at bedtime as needed  Patient Instructions: 1)  Back Hygiene as  discussed. Prescriptions: CYCLOBENZAPRINE HCL 5 MG TABS (CYCLOBENZAPRINE HCL) 1 two times a day & 2 at bedtime as needed  #20 x 1   Entered and Authorized by:   Marga Melnick MD   Signed by:   Marga Melnick MD on 05/05/2009   Method used:   Print then Give to Patient   RxID:   [id number] TRAMADOL HCL 50 MG TABS (TRAMADOL HCL) 1-2 q 6 hrs as needed  #30 x 1   Entered and Authorized by:   Marga Melnick MD   Signed by:   Marga Melnick MD on 05/05/2009   Method used:   Print then Give to Patient   RxID:   [id number]

## 2010-10-03 NOTE — Progress Notes (Signed)
Summary: Groin Pain-lmom7/20  Phone Note Call from Patient Call back at Home Phone [phone]   Caller: Patient Summary of Call: Patient called and left a message on the triage VM stating that he had finished his abx while at the beach and his testicle is feeling much better. He says now he is having a soreness/strain feeling in his groin area around his thighs. He is unsure of what he needs to do about this or if this is coming from the testicle pain as well. Please advise.  Initial call taken by: Harold Barban,  March 21, 2010 2:37 PM  Follow-up for Phone Call        I'm sorry but if this persists ,I need to see him to consider Xrays or possibly referral to Urologist  Follow-up by: Marga Melnick MD,  March 22, 2010 8:12 AM  Additional Follow-up for Phone Call Additional follow up Details #1::        left message on machine .............Marland KitchenDoristine Devoid CMA  March 22, 2010 9:57 AM   spoke w/ patient wants to go ahead w/ urology referral as mention on last office visit note doesn't think coming in would be helpful since he has already had several visits for this issue informed that we will work on urology referral and contact once appt is scheduled.....Marland KitchenMarland KitchenDoristine Devoid CMA  March 22, 2010 10:37 AM

## 2010-10-03 NOTE — Assessment & Plan Note (Signed)
Summary: NECK AND SHOULDER PAIN  PH   Vital Signs:  Patient Profile:   40 Years Old Male Weight:      165.38 pounds Pulse rate:   64 / minute Pulse rhythm:   regular BP sitting:   108 / 58  (left arm) Cuff size:   large  Pt. in pain?   no  Vitals Entered By: Wendall Stade (June 06, 2007 3:03 PM)                  Chief Complaint:  neck and jaw pain last night and numbness left arm this am.  History of Present Illness:  At present feels pretty good, but  symptoms were alarming.  He has had this happen before when under stress and he said he has had alot of stress lately, denies chest pain, sweats, nausea. Symptoms started while driving; major stresses(job, young baby, new house). Recentlty moving items into new house; no regular CVE.  Current Allergies (reviewed today): ! PCN ! [place] Updated/Current Medications (including changes made in today's visit):  PROZAC 10 MG  CAPS (FLUOXETINE HCL) 1 once daily as directed   Past Medical History:    Unremarkable  Past Surgical History:    Oral surgery; fractured nose wrestling   Family History:    Father: HTN    Mother: CAD, stent    Siblings:     neg  Social History:    no diet   Risk Factors:  Tobacco use:  quit    Year quit:  1995 Alcohol use:  yes    Type:  socially Exercise:  no   Review of Systems  General      Denies chills, fatigue, fever, loss of appetite, malaise, sleep disorder, sweats, weakness, and weight loss.  ENT      Denies decreased hearing, earache, and ringing in ears.  CV      Denies bluish discoloration of lips or nails, chest pain or discomfort, difficulty breathing at night, difficulty breathing while lying down, leg cramps with exertion, palpitations, swelling of feet, and swelling of hands.  MS      Complains of low back pain.      Denies joint pain, joint redness, joint swelling, mid back pain, and thoracic pain.  Derm      Denies rash.  Neuro      See HPI      RUE  "asleep"' this am in forearm for 3 hrs   Physical Exam  General:     Well-developed,well-nourished,in no acute distress; alert,appropriate and cooperative throughout examination Eyes:     No corneal or conjunctival inflammation noted. EOMI. Perrla.  Ears:     External ear exam shows no significant lesions or deformities.  Otoscopic examination reveals clear canals, tympanic membranes are intact bilaterally without bulging, retraction, inflammation or discharge. Hearing is grossly normal bilaterally. Mouth:     Oral mucosa and oropharynx without lesions or exudates.  Teeth in good repair. Neck:     No deformities, masses, or tenderness noted. Suplple; full ROM Heart:     Normal rate and regular rhythm. S1 and S2 normal without gallop, murmur, click, rub or other extra sounds. Pulses:     No carotid bruits Extremities:     No clubbing, cyanosis, edema, or deformity noted with normal full range of motion of all joints.   Neurologic:     alert & oriented X3, cranial nerves II-XII intact, strength normal in all extremities, sensation intact to light  touch, gait normal, and DTRs symmetrical and normal.   Skin:     Intact without suspicious lesions or rashes Cervical Nodes:     No lymphadenopathy noted Axillary Nodes:     No palpable lymphadenopathy Psych:     Oriented X3, memory intact for recent and remote, normally interactive, good eye contact, not anxious appearing, and not depressed appearing.      Impression & Recommendations:  Problem # 1:  NECK PAIN, ACUTE (ICD-723.1)  Problem # 2:  ARM NUMBNESS (ICD-782.0)  Complete Medication List: 1)  Prozac 10 Mg Caps (Fluoxetine hcl) .Marland Kitchen.. 1 once daily as directed  Other Orders: EKG w/ Interpretation (93000)   Patient Instructions: 1)  Begin exercise program & consider options as discussed.    Prescriptions: PROZAC 10 MG  CAPS (FLUOXETINE HCL) 1 once daily as directed  #30 x 0   Entered and Authorized by:   Marga Melnick  MD   Signed by:   Marga Melnick MD on 06/06/2007   Method used:   Print then Give to Patient   RxID:   [id number]  ]

## 2010-10-03 NOTE — Assessment & Plan Note (Signed)
Summary: pain in stomach/kn   Vital Signs:  Patient profile:   40 year old male Weight:      167.8 pounds BMI:     25.99 Temp:     98.4 degrees F oral Pulse rate:   72 / minute Resp:     15 per minute BP sitting:   122 / 80  (left arm) Cuff size:   large  Vitals Entered By: Shonna Chock (February 24, 2010 12:11 PM) CC: Pain in lower abdomen/pelvic/upper leg area, Abdominal pain Comments REVIEWED MED LIST, PATIENT AGREED DOSE AND INSTRUCTION CORRECT    CC:  Pain in lower abdomen/pelvic/upper leg area and Abdominal pain.  History of Present Illness:  Abdominal Pain      This is a 40 year old man who presents with Abdominal pain X 5 days .  The patient reports loose stool & diarrhea X 2 days 06/19 -21, but denies nausea, vomiting, constipation, melena, hematochezia, anorexia, and hematemesis.  The location of the pain is left lower quadrant and suprapubic area.  The pain is described as dull, aching and radiating to the groin.  Associated symptoms include  ntermittent dysuria.  The patient denies the following symptoms: fever, weight loss, chest pain, jaundice, and dark urine.  The pain has no triggers or relievers. Most intense 06/23; better today w/o Rx. No PMH or FH of GI disease.  Allergies: 1)  ! Pcn 2)  ! * Emycin  Review of Systems General:  Denies chills and sweats. ENT:  Denies difficulty swallowing and hoarseness. Resp:  Denies cough and sputum productive. GI:  No clay colored stool. GU:  Denies discharge and hematuria. MS:  Denies low back pain. Derm:  Denies lesion(s) and rash.  Physical Exam  General:  well-nourished,in no acute distress; alert,appropriate and cooperative throughout examination Eyes:  No corneal or conjunctival inflammation noted. No icterus Mouth:  Oral mucosa and oropharynx without lesions or exudates.  Teeth in good repair. no pharyngeal erythema.   Lungs:  Normal respiratory effort, chest expands symmetrically. Lungs are clear to auscultation, no  crackles or wheezes. Heart:  Normal rate and regular rhythm. S1 and S2 normal without gallop, murmur, click, rub. S4 Abdomen:  Bowel sounds positive,abdomen soft and non-tender without masses, organomegaly or hernias noted. Genitalia:  Testes bilaterally descended without nodularity. No scrotal masses or lesions. No penis lesions or urethral discharge. Severe tenderness L epididymis  Skin:  Intact without suspicious lesions or rashes Cervical Nodes:  No lymphadenopathy noted Axillary Nodes:  No palpable lymphadenopathy Inguinal Nodes:  No significant adenopathy Psych:  normally interactive, good eye contact, and not anxious appearing.     Impression & Recommendations:  Problem # 1:  ABDOMINAL PAIN, LEFT LOWER QUADRANT (ICD-789.04) referred from # 2  Problem # 2:  EPIDIDYMITIS, LEFT (ICD-604.90)  Complete Medication List: 1)  Fluoxetine Hcl 10 Mg Caps (Fluoxetine hcl) .Marland Kitchen.. 1daily 2)  Lorazepam 0.5 Mg Tabs (Lorazepam) .Marland Kitchen.. 1 every 8 hrs as needed 3)  Ciprofloxacin Hcl 500 Mg Tabs (Ciprofloxacin hcl) .Marland Kitchen.. 1 two times a day 4)  Hydrocodone-acetaminophen 5-500 Mg Tabs (Hydrocodone-acetaminophen) .Marland Kitchen.. 1 every 4-6 hrs as needed for pain  Other Orders: UA Dipstick w/o Micro (manual) (44010)  Patient Instructions: 1)  Drink as much fluid as you can tolerate for the next few days. Fill pain meds if needed. [place] of scrotum  if symptoms persist. Sitz baths two times a day as needed .  Prescriptions: HYDROCODONE-ACETAMINOPHEN 5-500 MG TABS (HYDROCODONE-ACETAMINOPHEN) 1 every 4-6 hrs as  needed for pain  #20 x 0   Entered and Authorized by:   Marga Melnick MD   Signed by:   Marga Melnick MD on 02/24/2010   Method used:   Print then Give to Patient   RxID:   [id number] CIPROFLOXACIN HCL 500 MG TABS (CIPROFLOXACIN HCL) 1 two times a day  #20 x 0   Entered and Authorized by:   Marga Melnick MD   Signed by:   Marga Melnick MD on 02/24/2010   Method used:   Faxed to ...       [facility] [address] (retail)       [address]       [place], [place]  [address]       Ph: [phone]       Fax: [phone]   RxID:   [id number]   Laboratory Results   Urine Tests    Routine Urinalysis   Color: lt. yellow Appearance: Clear Glucose: negative   (Normal Range: Negative) Bilirubin: negative   (Normal Range: Negative) Ketone: negative   (Normal Range: Negative) Spec. Gravity: 1.010   (Normal Range: 1.003-1.035) Blood: negative   (Normal Range: Negative) pH: 6.0   (Normal Range: 5.0-8.0) Protein: negative   (Normal Range: Negative) Urobilinogen: 0.2   (Normal Range: 0-1) Nitrite: negative   (Normal Range: Negative) Leukocyte Esterace: negative   (Normal Range: Negative)

## 2010-10-03 NOTE — Letter (Signed)
Summary: [facility]  [facility]   Imported By: Lanelle Bal 04/04/2010 13:10:37  _____________________________________________________________________  External Attachment:    Type:   Image     Comment:   External Document

## 2010-10-03 NOTE — Assessment & Plan Note (Signed)
Summary: PAIN ALL OVER BODY//KN   Vital Signs:  Patient profile:   40 year old male Weight:      165.6 pounds Temp:     98.4 degrees F oral Pulse rate:   80 / minute Resp:     16 per minute BP sitting:   118 / 70  (left arm) Cuff size:   large  Vitals Entered By: Shonna Chock CMA (March 31, 2010 2:23 PM) CC: Ongoing pain    CC:  Ongoing pain .  History of Present Illness: He has had CT scan & repeat US for scrotal pain & L medial thigh discomfort. I have reviewed the  Urology note  ; it is being scanned.Marland KitchenHe is  on prolonged antibiotics (Septra DS) for  Epididymitis. He could not tolerate Gabapentin Rxed for the pain. Now he has intermittent burning  pain in suprapubic area  with   bilateral dull aches in the groin. Also he has had occasional throbbing or aching  pain  in axillae  &  L neck. These vary; today these are minor in intensity. He denies joint pain or changes.Additionally he has had a rash in the inguinal areas.  Allergies: 1)  ! Pcn 2)  ! * Emycin  Review of Systems General:  Denies chills, fever, and sweats; He has felt hot & sweaty. CV:  Denies chest pain or discomfort and palpitations. GI:  Denies dark tarry stools, diarrhea, and vomiting; ? scant blood in stool with ibuprofen. Nausea only with meds. GU:  Denies discharge, dysuria, and hematuria. MS:  Complains of muscle aches; denies joint pain, joint redness, and joint swelling. Derm:  Complains of hair loss, insect bite(s), itching, and rash; denies flushing and lesion(s). Neuro:  Denies headaches. Psych:  He is taking Fluoxetine irregularly until last few days.  Physical Exam  General:  well-nourished,in no acute distress; alert,appropriate and cooperative throughout examination Eyes:  No corneal or conjunctival inflammation noted. Perrla.No icterus . No lid lag Mouth:  Oral mucosa and oropharynx without lesions or exudates.  Teeth in good repair. No pharyngeal erythema.   Neck:  No deformities, masses, or  tenderness noted. Lungs:  Normal respiratory effort, chest expands symmetrically. Lungs are clear to auscultation, no crackles or wheezes. Heart:  Normal rate and regular rhythm. S1 and S2 normal without gallop, murmur, click, rub.S4 Abdomen:  Bowel sounds positive,abdomen soft and non-tender without masses, organomegaly or hernias noted. Extremities:  No clubbing, cyanosis, edema, or deformity noted with normal full range of motion of all joints.   Neurologic:  alert & oriented X3.   Skin:  Faint rash L inguinal area Cervical Nodes:  No lymphadenopathy noted Axillary Nodes:  No palpable lymphadenopathy Inguinal Nodes:  No significant adenopathy Psych:  memory intact for recent and remote and normally interactive.     Impression & Recommendations:  Problem # 1:  MUSCLE PAIN (ICD-729.1) Migratory : neck, axillae ,thigh The following medications were removed from the medication list:    Hydrocodone-acetaminophen 5-500 Mg Tabs (Hydrocodone-acetaminophen) .Marland Kitchen... 1 every 4-6 hrs as needed for pain His updated medication list for this problem includes:    Ibuprofen 200 Mg Tabs (Ibuprofen) .Marland Kitchen... As needed  Orders: Venipuncture (16109) T-Vitamin D (25-Hydroxy) [phone])  Problem # 2:  RASH-NONVESICULAR (ICD-782.1) Clinically [place] Orders: Venipuncture (91478)  Problem # 3:  EPIDIDYMITIS, LEFT (ICD-604.90) with scrotal & groin pain  Problem # 4:  ANXIETY (ICD-300.00)  The following medications were removed from the medication list:    Fluoxetine Hcl 10  Mg Caps (Fluoxetine hcl) .Marland Kitchen... 1daily(Note: it was effective despite being taken intermittently)    Lorazepam 0.5 Mg Tabs (Lorazepam) .Marland Kitchen... 1 every 8 hrs as needed His updated medication list for this problem includes:    Cymbalta 60 Mg Cpep (Duloxetine hcl) .Marland Kitchen... 1 once daily  Complete Medication List: 1)  Smz-tmp Ds 800-160 Mg Tabs (Sulfamethoxazole-trimethoprim) .... As directed per urologist 2)  Ibuprofen 200 Mg Tabs  (Ibuprofen) .... As needed 3)  Cymbalta 60 Mg Cpep (Duloxetine hcl) .Marland Kitchen.. 1 once daily  Patient Instructions: 1)  Use Cymbata samples ; fill  Rx if beneficial. Nizoral cream applied two times a day & blown dry with hairdrier for inguinal rash. Prescriptions: CYMBALTA 60 MG CPEP (DULOXETINE HCL) 1 once daily  #30 x 5   Entered and Authorized by:   Marga Melnick MD   Signed by:   Marga Melnick MD on 03/31/2010   Method used:   Print then Give to Patient   RxID:   [id number]

## 2010-10-03 NOTE — Consult Note (Signed)
Summary: [facility]  [facility]   Imported By: Lanelle Bal 04/12/2010 08:13:57  _____________________________________________________________________  External Attachment:    Type:   Image     Comment:   External Document

## 2012-01-25 ENCOUNTER — Encounter: Payer: Self-pay | Admitting: Internal Medicine

## 2012-01-25 ENCOUNTER — Ambulatory Visit (INDEPENDENT_AMBULATORY_CARE_PROVIDER_SITE_OTHER): Payer: BC Managed Care – PPO | Admitting: Internal Medicine

## 2012-01-25 VITALS — BP 126/82 | HR 81 | Temp 98.2°F | Wt 171.0 lb

## 2012-01-25 DIAGNOSIS — R059 Cough, unspecified: Secondary | ICD-10-CM

## 2012-01-25 DIAGNOSIS — R05 Cough: Secondary | ICD-10-CM

## 2012-01-25 MED ORDER — DOXYCYCLINE HYCLATE 100 MG PO TABS: 100.0000 mg | ORAL_TABLET | Freq: Two times a day (BID) | ORAL | Status: DC

## 2012-01-25 MED ORDER — HYDROCODONE-HOMATROPINE 5-1.5 MG/5ML PO SYRP: 5.0000 mL | ORAL_SOLUTION | Freq: Two times a day (BID) | ORAL | Status: DC | PRN

## 2012-01-25 NOTE — Patient Instructions (Signed)
Rest, fluids , tylenol For cough, take Mucinex DM twice a day as needed  For persisting cough, use hydrocodone. He will make you sleepy. Take the antibiotic as prescribed  (doxycycline) only if you are not improving in the next few days. Call if no better in next week Call anytime if the symptoms are severe, you have high fever, short of breath

## 2012-01-25 NOTE — Progress Notes (Signed)
  Subjective:    Patient ID: Cesar Herrera, male    DOB: 16-Oct-1970, 41 y.o.   MRN: [id number]  HPI Acute visit Complains of cough for the last 5 days, cough is dry but intense and  in spells. Daughter had similar symptoms 10 days ago.  Medical history: unremarkable  Social history, does not smoke.  Review of Systems Denies fever or chills No runny nose or sore throat. Denies chest congestion, no sinus pain.    Objective:   Physical Exam General -- alert, well-developed. No apparent distress.  HEENT -- TMs normal, throat w/o redness, face symmetric and not tender to palpation, nose not congested   Lungs -- normal respiratory effort, no intercostal retractions, no accessory muscle use, and normal breath sounds.   Heart-- normal rate, regular rhythm, no murmur, and no gallop.   Extremities-- no pretibial edema bilaterally  Psych-- Cognition and judgment appear intact. Alert and cooperative with normal attention span and concentration.  not anxious appearing and not depressed appearing.       Assessment & Plan:  Cough, Cough likely due to a viral illness. It is intense at times. Will control cough with over-the-counter  meds and hydrocodone. If not better, will take antibiotics--> atypical infection?. See  instructions

## 2012-02-04 ENCOUNTER — Other Ambulatory Visit: Payer: Self-pay | Admitting: *Deleted

## 2012-02-04 MED ORDER — DOXYCYCLINE HYCLATE 100 MG PO TABS: 100.0000 mg | ORAL_TABLET | Freq: Two times a day (BID) | ORAL | Status: AC

## 2012-02-04 MED ORDER — HYDROCODONE-HOMATROPINE 5-1.5 MG/5ML PO SYRP: 5.0000 mL | ORAL_SOLUTION | Freq: Two times a day (BID) | ORAL | Status: AC | PRN

## 2013-07-27 ENCOUNTER — Telehealth: Payer: Self-pay

## 2013-07-27 NOTE — Telephone Encounter (Signed)
Medication List and allergies: reviewed and updated  90 day supply/mail order: na Local prescriptions: [facility] [address]  Immunizations due: admin flu vaccine upon arrival  A/P:   No changes to FH or PSH Health Maintenance  Topic Date Due  . Influenza Vaccine  04/03/2013  . Tetanus/tdap  04/30/2018   To Discuss with Provider: Healthy well visit for work

## 2013-07-28 ENCOUNTER — Ambulatory Visit (INDEPENDENT_AMBULATORY_CARE_PROVIDER_SITE_OTHER): Payer: BC Managed Care – PPO | Admitting: Internal Medicine

## 2013-07-28 ENCOUNTER — Encounter: Payer: Self-pay | Admitting: Internal Medicine

## 2013-07-28 VITALS — BP 116/74 | HR 68 | Temp 97.8°F | Ht 67.5 in | Wt 173.2 lb

## 2013-07-28 DIAGNOSIS — M545 Low back pain, unspecified: Secondary | ICD-10-CM

## 2013-07-28 DIAGNOSIS — Z23 Encounter for immunization: Secondary | ICD-10-CM

## 2013-07-28 DIAGNOSIS — J302 Other seasonal allergic rhinitis: Secondary | ICD-10-CM

## 2013-07-28 DIAGNOSIS — Z0000 Encounter for general adult medical examination without abnormal findings: Secondary | ICD-10-CM

## 2013-07-28 DIAGNOSIS — J309 Allergic rhinitis, unspecified: Secondary | ICD-10-CM

## 2013-07-28 DIAGNOSIS — M62830 Muscle spasm of back: Secondary | ICD-10-CM | POA: Insufficient documentation

## 2013-07-28 DIAGNOSIS — IMO0001 Reserved for inherently not codable concepts without codable children: Secondary | ICD-10-CM

## 2013-07-28 LAB — LIPID PANEL
Cholesterol: 160 mg/dL (ref 0–200)
HDL: 46.2 mg/dL (ref >39.00)
LDL Cholesterol: 96 mg/dL (ref 0–99)
Total CHOL/HDL Ratio: 3
Triglycerides: 88 mg/dL (ref 0.0–149.0)
VLDL: 17.6 mg/dL (ref 0.0–40.0)

## 2013-07-28 LAB — CBC WITH DIFFERENTIAL/PLATELET
Basophils Absolute: 0 10*3/uL (ref 0.0–0.1)
Basophils Relative: 0.5 % (ref 0.0–3.0)
Eosinophils Absolute: 0.1 10*3/uL (ref 0.0–0.7)
Eosinophils Relative: 1.6 % (ref 0.0–5.0)
HCT: 43.1 % (ref 39.0–52.0)
Hemoglobin: 14.7 g/dL (ref 13.0–17.0)
Lymphocytes Relative: 25.7 % (ref 12.0–46.0)
Lymphs Abs: 1.3 10*3/uL (ref 0.7–4.0)
MCHC: 34.1 g/dL (ref 30.0–36.0)
MCV: 86.5 fl (ref 78.0–100.0)
Monocytes Absolute: 0.3 10*3/uL (ref 0.1–1.0)
Monocytes Relative: 7.1 % (ref 3.0–12.0)
Neutro Abs: 3.2 10*3/uL (ref 1.4–7.7)
Neutrophils Relative %: 65.1 % (ref 43.0–77.0)
Platelets: 194 10*3/uL (ref 150.0–400.0)
RBC: 4.98 Mil/uL (ref 4.22–5.81)
RDW: 12.5 % (ref 11.5–14.6)
WBC: 4.9 10*3/uL (ref 4.5–10.5)

## 2013-07-28 LAB — BASIC METABOLIC PANEL
BUN: 14 mg/dL (ref 6–23)
CO2: 27 mEq/L (ref 19–32)
Calcium: 9.3 mg/dL (ref 8.4–10.5)
Chloride: 103 mEq/L (ref 96–112)
Creatinine, Ser: 1.1 mg/dL (ref 0.4–1.5)
GFR: 80.74 mL/min (ref >60.00)
Glucose, Bld: 101 mg/dL — ABNORMAL HIGH (ref 70–99)
Potassium: 3.8 mEq/L (ref 3.5–5.1)
Sodium: 137 mEq/L (ref 135–145)

## 2013-07-28 LAB — HEPATIC FUNCTION PANEL
ALT: 25 U/L (ref 0–53)
AST: 19 U/L (ref 0–37)
Albumin: 4.4 g/dL (ref 3.5–5.2)
Alkaline Phosphatase: 60 U/L (ref 39–117)
Bilirubin, Direct: 0 mg/dL (ref 0.0–0.3)
Total Bilirubin: 0.6 mg/dL (ref 0.3–1.2)
Total Protein: 7.4 g/dL (ref 6.0–8.3)

## 2013-07-28 LAB — TSH: TSH: 1.6 u[IU]/mL (ref 0.35–5.50)

## 2013-07-28 NOTE — Progress Notes (Signed)
Pre visit review using our clinic review tool, if applicable. No additional management support is needed unless otherwise documented below in the visit note. 

## 2013-07-28 NOTE — Progress Notes (Signed)
  Subjective:    Patient ID: Cesar Herrera, male    DOB: 03/06/1971, 42 y.o.   MRN: [id number]  HPI  He is here for a physical;acute issues include intermittent LBP .     Review of Systems   He has also has rare knee pain & stiff which he relates to having hiked the [place] in 1999.NSAIDS help.  He has developed seasonal allergies in the last several years related to pollen. He is on no maintenance medicines.     Objective:   Physical Exam Gen.: Healthy and well-nourished in appearance. Alert, appropriate and cooperative throughout exam.Appears younger than stated age  Head: Normocephalic without obvious abnormalities; no alopecia  Eyes: No corneal or conjunctival inflammation noted. Pupils equal round reactive to light and accommodation. Extraocular motion intact.  Ears: External  ear exam reveals no significant lesions or deformities. Canals clear .TMs normal.  Nose: External nasal exam reveals no deformity or inflammation. Nasal mucosa are pink and moist. No lesions or exudates noted.   Mouth: Oral mucosa and oropharynx reveal no lesions or exudates. Teeth in good repair. Neck: No deformities, masses, or tenderness noted. Range of motion & Thyroid normal. Lungs: Normal respiratory effort; chest expands symmetrically. Lungs are clear to auscultation without rales, wheezes, or increased work of breathing. Heart: Normal rate and rhythm. Normal S1 and S2. No gallop, click, or rub. S4 w/o murmur. Abdomen: Bowel sounds normal; abdomen soft and nontender. No masses, organomegaly or hernias noted. Genitalia: Genitalia normal except for left varices. Prostate is normal without enlargement, asymmetry, nodularity, or induration                                   Musculoskeletal/extremities: No deformity or scoliosis noted of  the thoracic or lumbar spine.  No clubbing, cyanosis, edema, or significant extremity  deformity noted. Range of motion normal .Tone & strength normal. Crepitus  knees, L > R Hand joints normal . Fingernail  health good. Able to lie down & sit up w/o help. Negative SLR bilaterally Vascular: Carotid, radial artery, dorsalis pedis and  posterior tibial pulses are full and equal. No bruits present. Neurologic: Alert and oriented x3. Deep tendon reflexes symmetrical and normal.        Skin: Intact without suspicious lesions or rashes. Lymph: No cervical, axillary, or inguinal lymphadenopathy present. Psych: Mood and affect are normal. Normally interactive                                                                                        Assessment & Plan:  #1 comprehensive physical exam; no acute findings  Plan: see Orders  & Recommendations

## 2013-07-28 NOTE — Patient Instructions (Addendum)
The best exercises for the low back include freestyle swimming, stretch aerobics, and yoga. Plain Mucinex (NOT D) for thick secretions ;force NON dairy fluids .   Nasal cleansing in the shower as discussed with lather of mild shampoo.After 10 seconds wash off lather while  exhaling through nostrils. Make sure that all residual soap is removed to prevent irritation.  Nasacort AQ OTC 1 spray in each nostril twice a day as needed. Use the "crossover" technique into opposite nostril spraying toward opposite ear @ 45 degree angle, not straight up into nostril.  Use a Neti pot daily only  as needed for significant sinus congestion; going from open side to congested side . Plain Allegra (NOT D )  160 daily , Loratidine 10 mg , OR Zyrtec 10 mg @ bedtime  as needed for itchy eyes & sneezing.    Your next office appointment will be determined based upon review of your pending labs . Those instructions will be transmitted to you through My Chart .

## 2013-07-29 ENCOUNTER — Ambulatory Visit: Payer: BC Managed Care – PPO

## 2013-07-29 ENCOUNTER — Encounter: Payer: Self-pay | Admitting: *Deleted

## 2013-07-29 DIAGNOSIS — R7309 Other abnormal glucose: Secondary | ICD-10-CM

## 2013-07-29 LAB — HEMOGLOBIN A1C: Hgb A1c MFr Bld: 5 % (ref 4.6–6.5)

## 2015-12-20 ENCOUNTER — Ambulatory Visit: Payer: Self-pay | Admitting: Internal Medicine

## 2016-01-12 ENCOUNTER — Ambulatory Visit (INDEPENDENT_AMBULATORY_CARE_PROVIDER_SITE_OTHER): Payer: Managed Care, Other (non HMO) | Admitting: Internal Medicine

## 2016-01-12 ENCOUNTER — Encounter: Payer: Self-pay | Admitting: Internal Medicine

## 2016-01-12 VITALS — BP 134/82 | HR 79 | Temp 98.4°F | Resp 16 | Ht 67.0 in | Wt 174.0 lb

## 2016-01-12 DIAGNOSIS — Z0000 Encounter for general adult medical examination without abnormal findings: Secondary | ICD-10-CM

## 2016-01-12 MED ORDER — THERA VITAL M PO TABS
1.0000 | ORAL_TABLET | Freq: Every day | ORAL | Status: DC
Start: 2016-01-12 — End: 2022-01-31

## 2016-01-12 NOTE — Progress Notes (Signed)
Subjective:    Patient ID: Cesar Herrera, male    DOB: [birth year], 45 y.o.   MRN: [id number]  HPI He is here to establish with a new pcp.   He is here for a physical exam.    He denies any changes in his history since he was here last. He has no major concerns or questions.  Medications and allergies reviewed with patient and updated if appropriate.  Patient Active Problem List   Diagnosis Date Noted  . Low back syndrome 07/28/2013  . Seasonal allergies 07/28/2013    No current outpatient prescriptions on file prior to visit.   No current facility-administered medications on file prior to visit.    Past Medical History  Diagnosis Date  . H/O epididymitis 2011  . Seasonal allergies     Past Surgical History  Procedure Laterality Date  . Wisdom tooth extraction      Social History   Social History  . Marital Status: Married    Spouse Name: N/A  . Number of Children: N/A  . Years of Education: N/A   Social History Main Topics  . Smoking status: Former Smoker    Types: Cigarettes    Quit date: 07/29/2003  . Smokeless tobacco: None     Comment: smoked 1993-2004, up to < 4 cigarettes/ day (socially)  . Alcohol Use: Yes     Comment: 1-2 drinks , wine or beer  . Drug Use: No  . Sexual Activity: Not Asked   Other Topics Concern  . None   Social History Narrative   Exercise: mountain biking, hiking, walking - tends to exercise in spurts, does some yoga    Family History  Problem Relation Age of Onset  . Diabetes Maternal Uncle   . Stroke      PG aunts & PG uncles  . Cancer Neg Hx   . Heart disease Paternal Grandfather     Review of Systems  Constitutional: Negative for fever, chills, appetite change, fatigue and unexpected weight change.  HENT: Negative for hearing loss.   Eyes: Negative for visual disturbance.  Respiratory: Negative for cough, shortness of breath and wheezing.   Cardiovascular: Negative for chest pain, palpitations and leg  swelling.  Gastrointestinal: Negative for nausea, abdominal pain, diarrhea, constipation and blood in stool.       Occ gerd  Genitourinary: Negative for dysuria, hematuria and difficulty urinating.  Musculoskeletal: Positive for arthralgias (left knee injury years ago - tweeks it on occasion).  Neurological: Negative for dizziness, light-headedness and headaches.  Psychiatric/Behavioral: Negative for dysphoric mood. The patient is not nervous/anxious.        Objective:   Filed Vitals:   01/12/16 0946  BP: 134/82  Pulse: 79  Temp: 98.4 F (36.9 C)  Resp: 16   Filed Weights   01/12/16 0946  Weight: 174 lb (78.926 kg)   Body mass index is 27.25 kg/(m^2).   Physical Exam Constitutional: He appears well-developed and well-nourished. No distress.  HENT:  Head: Normocephalic and atraumatic.  Right Ear: External ear normal.  Left Ear: External ear normal.  Mouth/Throat: Oropharynx is clear and moist.  Normal ear canals and TM b/l  Eyes: Conjunctivae normal.  Neck: Neck supple. No tracheal deviation present. No thyromegaly present.  No carotid bruit  Cardiovascular: Normal rate, regular rhythm, normal heart sounds and intact distal pulses.   No murmur heard. Pulmonary/Chest: Effort normal and breath sounds normal. No respiratory distress. He has no wheezes. He has no rales.  Abdominal: Soft. Bowel sounds are normal. He exhibits no distension. There is no tenderness.  Genitourinary: deferred  Musculoskeletal: He exhibits no edema.  Lymphadenopathy:    He has no cervical adenopathy.  Skin: Skin is warm and dry. He is not diaphoretic.  Psychiatric: He has a normal mood and affect. His behavior is normal.         Assessment & Plan:   Physical exam: Screening blood work ordered Immunizations ordered Exercise-he exercises, but sometimes it regularly. Typically his exercise is regular, but it depends on work, family Weight - BMI just above normal. He is working on weight  loss Skin -no concerns, has had a full skin check by dermatology Substance abuse-no evidence of abuse  Takes over-the-counter allergy medication if needed for seasonal allergies Does have some intermittent knee issues from a prior injury, but nothing that bothers him on a day-to-day basis  Follow-up as needed

## 2016-01-12 NOTE — Progress Notes (Signed)
Pre visit review using our clinic review tool, if applicable. No additional management support is needed unless otherwise documented below in the visit note. 

## 2016-01-12 NOTE — Patient Instructions (Addendum)
  Test(s) ordered today. Your results will be released to MyChart (or called to you) after review, usually within 72hours after test completion. If any changes need to be made, you will be notified at that same time.  All other Health Maintenance issues reviewed.   All recommended immunizations and age-appropriate screenings are up-to-date or discussed.  No immunizations administered today.   Medications reviewed and updated.  No changes recommended at this time.     

## 2019-09-08 NOTE — Patient Instructions (Addendum)
Blood work was ordered.     Medications reviewed and updated.  Changes include :   none    Please followup in 1 year    Health Maintenance, Male Adopting a healthy lifestyle and getting preventive care are important in promoting health and wellness. Ask your health care provider about:  The right schedule for you to have regular tests and exams.  Things you can do on your own to prevent diseases and keep yourself healthy. What should I know about diet, weight, and exercise? Eat a healthy diet   Eat a diet that includes plenty of vegetables, fruits, low-fat dairy products, and lean protein.  Do not eat a lot of foods that are high in solid fats, added sugars, or sodium. Maintain a healthy weight Body mass index (BMI) is a measurement that can be used to identify possible weight problems. It estimates body fat based on height and weight. Your health care provider can help determine your BMI and help you achieve or maintain a healthy weight. Get regular exercise Get regular exercise. This is one of the most important things you can do for your health. Most adults should:  Exercise for at least 150 minutes each week. The exercise should increase your heart rate and make you sweat (moderate-intensity exercise).  Do strengthening exercises at least twice a week. This is in addition to the moderate-intensity exercise.  Spend less time sitting. Even light physical activity can be beneficial. Watch cholesterol and blood lipids Have your blood tested for lipids and cholesterol at 48 years of age, then have this test every 5 years. You may need to have your cholesterol levels checked more often if:  Your lipid or cholesterol levels are high.  You are older than 48 years of age.  You are at high risk for heart disease. What should I know about cancer screening? Many types of cancers can be detected early and may often be prevented. Depending on your health history and family  history, you may need to have cancer screening at various ages. This may include screening for:  Colorectal cancer.  Prostate cancer.  Skin cancer.  Lung cancer. What should I know about heart disease, diabetes, and high blood pressure? Blood pressure and heart disease  High blood pressure causes heart disease and increases the risk of stroke. This is more likely to develop in people who have high blood pressure readings, are of African descent, or are overweight.  Talk with your health care provider about your target blood pressure readings.  Have your blood pressure checked: ? Every 3-5 years if you are 67-51 years of age. ? Every year if you are 86 years old or older.  If you are between the ages of 47 and 73 and are a current or former smoker, ask your health care provider if you should have a one-time screening for abdominal aortic aneurysm (AAA). Diabetes Have regular diabetes screenings. This checks your fasting blood sugar level. Have the screening done:  Once every three years after age 49 if you are at a normal weight and have a low risk for diabetes.  More often and at a younger age if you are overweight or have a high risk for diabetes. What should I know about preventing infection? Hepatitis B If you have a higher risk for hepatitis B, you should be screened for this virus. Talk with your health care provider to find out if you are at risk for hepatitis B infection. Hepatitis C Blood testing  is recommended for:  Everyone born from [birth year] through 1964.  Anyone with known risk factors for hepatitis C. Sexually transmitted infections (STIs)  You should be screened each year for STIs, including gonorrhea and chlamydia, if: ? You are sexually active and are younger than 48 years of age. ? You are older than 48 years of age and your health care provider tells you that you are at risk for this type of infection. ? Your sexual activity has changed since you were last  screened, and you are at increased risk for chlamydia or gonorrhea. Ask your health care provider if you are at risk.  Ask your health care provider about whether you are at high risk for HIV. Your health care provider may recommend a prescription medicine to help prevent HIV infection. If you choose to take medicine to prevent HIV, you should first get tested for HIV. You should then be tested every 3 months for as long as you are taking the medicine. Follow these instructions at home: Lifestyle  Do not use any products that contain nicotine or tobacco, such as cigarettes, e-cigarettes, and chewing tobacco. If you need help quitting, ask your health care provider.  Do not use street drugs.  Do not share needles.  Ask your health care provider for help if you need support or information about quitting drugs. Alcohol use  Do not drink alcohol if your health care provider tells you not to drink.  If you drink alcohol: ? Limit how much you have to 0-2 drinks a day. ? Be aware of how much alcohol is in your drink. In the [place] one drink equals one 12 oz bottle of beer (355 mL), one 5 oz glass of wine (148 mL), or one 1 oz glass of hard liquor (44 mL). General instructions  Schedule regular health, dental, and eye exams.  Stay current with your vaccines.  Tell your health care provider if: ? You often feel depressed. ? You have ever been abused or do not feel safe at home. Summary  Adopting a healthy lifestyle and getting preventive care are important in promoting health and wellness.  Follow your health care provider's instructions about healthy diet, exercising, and getting tested or screened for diseases.  Follow your health care provider's instructions on monitoring your cholesterol and blood pressure. This information is not intended to replace advice given to you by your health care provider. Make sure you discuss any questions you have with your health care provider. Document  Revised: 08/13/2018 Document Reviewed: 08/13/2018 Elsevier Patient Education  2020 [employer].

## 2019-09-08 NOTE — Progress Notes (Signed)
Subjective:    Patient ID: Cesar Herrera, male    DOB: 10-30-1970, 49 y.o.   MRN: [id number]  HPI He is here for a physical exam.   He had COVID in Nov.  His symptoms were mild then and he has recovered.   He denies major changes in his health.  His dad was diagnosed with DM.   Medications and allergies reviewed with patient and updated if appropriate.  Patient Active Problem List   Diagnosis Date Noted  . Low back syndrome 07/28/2013  . Seasonal allergies 07/28/2013    Current Outpatient Medications on File Prior to Visit  Medication Sig Dispense Refill  . Multiple Vitamins-Minerals (MULTIVITAMIN) tablet Take 1 tablet by mouth daily.     No current facility-administered medications on file prior to visit.    Past Medical History:  Diagnosis Date  . H/O epididymitis 2011  . Seasonal allergies     Past Surgical History:  Procedure Laterality Date  . WISDOM TOOTH EXTRACTION      Social History   Socioeconomic History  . Marital status: Married    Spouse name: Not on file  . Number of children: Not on file  . Years of education: Not on file  . Highest education level: Not on file  Occupational History  . Not on file  Tobacco Use  . Smoking status: Former Smoker    Types: Cigarettes    Quit date: 07/29/2003    Years since quitting: 16.1  . Smokeless tobacco: Never Used  . Tobacco comment: smoked 1993-2004, up to < 4 cigarettes/ day (socially)  Substance and Sexual Activity  . Alcohol use: Yes    Comment: 1-2 drinks , wine or beer  . Drug use: No  . Sexual activity: Not on file  Other Topics Concern  . Not on file  Social History Narrative   Exercise: mountain biking, hiking, walking - tends to exercise in spurts, does some yoga   Social Determinants of Health   Financial Resource Strain:   . Difficulty of Paying Living Expenses: Not on file  Food Insecurity:   . Worried About Charity fundraiser in the Last Year: Not on file  . Ran Out of Food in  the Last Year: Not on file  Transportation Needs:   . Lack of Transportation (Medical): Not on file  . Lack of Transportation (Non-Medical): Not on file  Physical Activity:   . Days of Exercise per Week: Not on file  . Minutes of Exercise per Session: Not on file  Stress:   . Feeling of Stress : Not on file  Social Connections:   . Frequency of Communication with Friends and Family: Not on file  . Frequency of Social Gatherings with Friends and Family: Not on file  . Attends Religious Services: Not on file  . Active Member of Clubs or Organizations: Not on file  . Attends Archivist Meetings: Not on file  . Marital Status: Not on file    Family History  Problem Relation Age of Onset  . Diabetes Maternal Uncle   . Diabetes Father   . Stroke Other        PG aunts & PG uncles  . Heart disease Paternal Grandfather   . Cancer Neg Hx     Review of Systems  Constitutional: Negative for chills and fever.  Eyes: Negative for visual disturbance.  Respiratory: Negative for cough, shortness of breath and wheezing.   Cardiovascular: Negative for chest  pain, palpitations and leg swelling.  Gastrointestinal: Negative for abdominal pain, blood in stool, constipation, diarrhea and nausea.       Occ gerd  Genitourinary: Negative for difficulty urinating, dysuria and hematuria.  Musculoskeletal: Positive for back pain (intermittent pain 1-2 times a year - spasms). Negative for arthralgias.  Skin: Negative for color change and rash.  Neurological: Negative for light-headedness and headaches.  Psychiatric/Behavioral: Negative for dysphoric mood. The patient is not nervous/anxious.        Objective:   Vitals:   09/09/19 1003  BP: 132/84  Pulse: 82  Resp: 16  Temp: 98.4 F (36.9 C)  SpO2: 99%   Filed Weights   09/09/19 1003  Weight: 178 lb (80.7 kg)   Body mass index is 27.88 kg/m.  BP Readings from Last 3 Encounters:  09/09/19 132/84  01/12/16 134/82  07/28/13  116/74    Wt Readings from Last 3 Encounters:  09/09/19 178 lb (80.7 kg)  01/12/16 174 lb (78.9 kg)  07/28/13 173 lb 3.2 oz (78.6 kg)     Physical Exam Constitutional: He appears well-developed and well-nourished. No distress.  HENT:  Head: Normocephalic and atraumatic.  Right Ear: External ear normal.  Left Ear: External ear normal.  Mouth/Throat: Oropharynx is clear and moist.  Normal ear canals and TM b/l  Eyes: Conjunctivae and EOM are normal.  Neck: Neck supple. No tracheal deviation present. No thyromegaly present.  No carotid bruit  Cardiovascular: Normal rate, regular rhythm, normal heart sounds and intact distal pulses.   No murmur heard. Pulmonary/Chest: Effort normal and breath sounds normal. No respiratory distress. He has no wheezes. He has no rales.  Abdominal: Soft. He exhibits no distension. There is no tenderness.  Genitourinary: deferred  Musculoskeletal: He exhibits no edema.  Lymphadenopathy:   He has no cervical adenopathy.  Skin: Skin is warm and dry. He is not diaphoretic.  Psychiatric: He has a normal mood and affect. His behavior is normal.         Assessment & Plan:   Physical exam: Screening blood work  ordered Immunizations  tdap today, flu up to date Exercise   Regular - mountain bike, some weights, yoga Weight  Mildly overweight Substance abuse   None   See Problem List for Assessment and Plan of chronic medical problems.    This visit occurred during the SARS-CoV-2 public health emergency.  Safety protocols were in place, including screening questions prior to the visit, additional usage of staff PPE, and extensive cleaning of exam room while observing appropriate contact time as indicated for disinfecting solutions.

## 2019-09-09 ENCOUNTER — Other Ambulatory Visit: Payer: Self-pay

## 2019-09-09 ENCOUNTER — Ambulatory Visit (INDEPENDENT_AMBULATORY_CARE_PROVIDER_SITE_OTHER): Payer: Managed Care, Other (non HMO) | Admitting: Internal Medicine

## 2019-09-09 ENCOUNTER — Encounter: Payer: Self-pay | Admitting: Internal Medicine

## 2019-09-09 VITALS — BP 132/84 | HR 82 | Temp 98.4°F | Resp 16 | Ht 67.0 in | Wt 178.0 lb

## 2019-09-09 DIAGNOSIS — Z23 Encounter for immunization: Secondary | ICD-10-CM

## 2019-09-09 DIAGNOSIS — Z0000 Encounter for general adult medical examination without abnormal findings: Secondary | ICD-10-CM | POA: Diagnosis not present

## 2019-09-09 DIAGNOSIS — M62830 Muscle spasm of back: Secondary | ICD-10-CM

## 2019-09-09 LAB — LIPID PANEL
Cholesterol: 198 mg/dL (ref 0–200)
HDL: 54.3 mg/dL (ref >39.00)
LDL Cholesterol: 125 mg/dL — ABNORMAL HIGH (ref 0–99)
NonHDL: 143.81
Total CHOL/HDL Ratio: 4
Triglycerides: 96 mg/dL (ref 0.0–149.0)
VLDL: 19.2 mg/dL (ref 0.0–40.0)

## 2019-09-09 LAB — CBC WITH DIFFERENTIAL/PLATELET
Basophils Absolute: 0 10*3/uL (ref 0.0–0.1)
Basophils Relative: 0.7 % (ref 0.0–3.0)
Eosinophils Absolute: 0.1 10*3/uL (ref 0.0–0.7)
Eosinophils Relative: 2.2 % (ref 0.0–5.0)
HCT: 43.3 % (ref 39.0–52.0)
Hemoglobin: 14.7 g/dL (ref 13.0–17.0)
Lymphocytes Relative: 24.9 % (ref 12.0–46.0)
Lymphs Abs: 1.3 10*3/uL (ref 0.7–4.0)
MCHC: 33.8 g/dL (ref 30.0–36.0)
MCV: 88.9 fl (ref 78.0–100.0)
Monocytes Absolute: 0.4 10*3/uL (ref 0.1–1.0)
Monocytes Relative: 8.4 % (ref 3.0–12.0)
Neutro Abs: 3.4 10*3/uL (ref 1.4–7.7)
Neutrophils Relative %: 63.8 % (ref 43.0–77.0)
Platelets: 208 10*3/uL (ref 150.0–400.0)
RBC: 4.87 Mil/uL (ref 4.22–5.81)
RDW: 12.7 % (ref 11.5–15.5)
WBC: 5.3 10*3/uL (ref 4.0–10.5)

## 2019-09-09 LAB — COMPREHENSIVE METABOLIC PANEL
ALT: 24 U/L (ref 0–53)
AST: 20 U/L (ref 0–37)
Albumin: 4.5 g/dL (ref 3.5–5.2)
Alkaline Phosphatase: 59 U/L (ref 39–117)
BUN: 12 mg/dL (ref 6–23)
CO2: 27 mEq/L (ref 19–32)
Calcium: 9.6 mg/dL (ref 8.4–10.5)
Chloride: 103 mEq/L (ref 96–112)
Creatinine, Ser: 1.05 mg/dL (ref 0.40–1.50)
GFR: 75.5 mL/min (ref >60.00)
Glucose, Bld: 109 mg/dL — ABNORMAL HIGH (ref 70–99)
Potassium: 4.1 mEq/L (ref 3.5–5.1)
Sodium: 137 mEq/L (ref 135–145)
Total Bilirubin: 0.5 mg/dL (ref 0.2–1.2)
Total Protein: 7.2 g/dL (ref 6.0–8.3)

## 2019-09-09 LAB — TSH: TSH: 1.62 u[IU]/mL (ref 0.35–4.50)

## 2019-09-09 NOTE — Assessment & Plan Note (Signed)
Chronic intermittent - 1-2 times a year Managed at home with TENS unit, oral anti-inflammatories

## 2019-09-09 NOTE — Addendum Note (Signed)
Addended by: Delice Bison E on: 09/09/2019 12:33 PM   Modules accepted: Orders

## 2019-09-10 ENCOUNTER — Encounter: Payer: Self-pay | Admitting: Internal Medicine

## 2020-10-03 ENCOUNTER — Telehealth: Payer: Self-pay | Admitting: Internal Medicine

## 2020-10-03 DIAGNOSIS — Z1211 Encounter for screening for malignant neoplasm of colon: Secondary | ICD-10-CM

## 2020-10-03 NOTE — Telephone Encounter (Signed)
ordered

## 2020-10-03 NOTE — Telephone Encounter (Signed)
    Patient requesting referral to GI for colonoscopy

## 2020-10-10 ENCOUNTER — Encounter: Payer: Self-pay | Admitting: Nurse Practitioner

## 2020-10-23 NOTE — Progress Notes (Signed)
10/23/2020 AVERI CACIOPPO [id number] 08/30/1971   CHIEF COMPLAINT:  Schedule a colonoscopy   HISTORY OF PRESENT ILLNESS:  Merrie Roof. Suhr is a 50 year old male with a past medical history of Covid 19 Nov. 2020. Past wisdom teeth extraction. He was referred to our office by Dr. Quay Burow to schedule a screening colonoscopy. He complains of passing a loose stool in the morning for the past 4 to 5 months which has progressively worsened. He has increased urgency and passes a 2nd loose BM an hour later. He infrequently passes a softly formed stool in the evenings. He sometimes feels the urge to pass a BM but passes only gas, no stool. No bloody stools but he has seen a small amount of red blood on the toilet tissue a few times. No mucous per the rectum. No abdominal pain. No rectal pain. No antibiotics for the past year. Infrequent NSAID use. No weight loss. No fever, sweats or chills. No dietary changes. He eats fairly healthy diet. Limited dairy products. His work related stress level is high.  No family history of IBD, celiac disease or colorectal cancer.  No other complaints at this time.   CBC Latest Ref Rng & Units 09/09/2019 07/28/2013 03/31/2010  WBC 4.0 - 10.5 K/uL 5.3 4.9 5.3  Hemoglobin 13.0 - 17.0 g/dL 14.7 14.7 14.8  Hematocrit 39.0 - 52.0 % 43.3 43.1 43.2  Platelets 150.0 - 400.0 K/uL 208.0 194.0 233   CMP Latest Ref Rng & Units 09/09/2019 07/28/2013 03/31/2010  Glucose 70 - 99 mg/dL 109(H) 101(H) 95  BUN 6 - 23 mg/dL 12 14 13   Creatinine 0.40 - 1.50 mg/dL 1.05 1.1 1.13  Sodium 135 - 145 mEq/L 137 137 139  Potassium 3.5 - 5.1 mEq/L 4.1 3.8 4.3  Chloride 96 - 112 mEq/L 103 103 103  CO2 19 - 32 mEq/L 27 27 22   Calcium 8.4 - 10.5 mg/dL 9.6 9.3 9.6  Total Protein 6.0 - 8.3 g/dL 7.2 7.4 7.3  Total Bilirubin 0.2 - 1.2 mg/dL 0.5 0.6 0.4  Alkaline Phos 39 - 117 U/L 59 60 65  AST 0 - 37 U/L 20 19 21   ALT 0 - 53 U/L 24 25 22   TSH 1.62  Past Medical History:  Diagnosis Date  . H/O  epididymitis 2011  . Seasonal allergies    Past Surgical History:  Procedure Laterality Date  . WISDOM TOOTH EXTRACTION     Social History: Smoked cigarettes while in college. He drinks beer 1 or 2 beers or a glass a wine most evenings. No drug use.   Family History: Father age 37 diabetes. Mother age 65 healthy. Brother is healthy. No family history of esophageal, gastric or colon cancer.    Allergies  Allergen Reactions  . Erythromycin     ? Reaction as child  . Penicillins     ? Reaction as child      Outpatient Encounter Medications as of 10/24/2020  Medication Sig  . Multiple Vitamins-Minerals (MULTIVITAMIN) tablet Take 1 tablet by mouth daily.   No facility-administered encounter medications on file as of 10/24/2020.   REVIEW OF SYSTEMS: Gen: Denies fever, sweats or chills. No weight loss.  CV: Denies chest pain, palpitations or edema. Resp: Denies cough, shortness of breath of hemoptysis.  GI: See HPI.  GU : Denies urinary burning, blood in urine, increased urinary frequency or incontinence. MS: Denies joint pain, muscles aches or weakness. Derm: Denies rash, itchiness, skin lesions or unhealing ulcers.  Psych: Denies depression, anxiety or memory loss. Heme: Denies bruising, bleeding. Neuro:  Denies headaches, dizziness or paresthesias. Endo:  Denies any problems with DM, thyroid or adrenal function.  PHYSICAL EXAM: BP 128/79   Pulse 85   Ht 5\' 7"  (1.702 m)   Wt 182 lb 9.6 oz (82.8 kg)   SpO2 98%   BMI 28.60 kg/m   General: Well developed 50 year old male in no acute distress. Head: Normocephalic and atraumatic. Eyes:  Sclerae non-icteric, conjunctive pink. Ears: Normal auditory acuity. Mouth: Dentition intact. No ulcers or lesions.  Neck: Supple, no lymphadenopathy or thyromegaly.  Lungs: Clear bilaterally to auscultation without wheezes, crackles or rhonchi. Heart: Regular rate and rhythm. No murmur, rub or gallop appreciated.  Abdomen: Soft, nontender,  non distended. No masses. No hepatosplenomegaly. Normoactive bowel sounds x 4 quadrants.  Rectal: Deferred. Musculoskeletal: Symmetrical with no gross deformities. Skin: Warm and dry. No rash or lesions on visible extremities. Extremities: No edema. Neurological: Alert oriented x 4, no focal deficits.  Psychological:  Alert and cooperative. Normal mood and affect.  ASSESSMENT AND PLAN:  49. 50 year old male presents with change in bowel pattern, loose stools with intermittent urgency x 4 to 5 months -GI pathogen panel -CRP, TTG, IGA -Colonoscopy to rule out colitis/IBD and to assess for colon polyps. Colonoscopy benefits and risks discussed including risk with sedation, risk of bleeding, perforation and infection  -Phillip's Bacteria probiotic one po daily  -Imodium one tab po daily -Follow up with PCP regarding stress management  -Further follow up to be determined after colonoscopy completed  -Patient to call our office if symptoms worsen       CC:  [name], MD

## 2020-10-24 ENCOUNTER — Encounter: Payer: Self-pay | Admitting: Nurse Practitioner

## 2020-10-24 ENCOUNTER — Other Ambulatory Visit (INDEPENDENT_AMBULATORY_CARE_PROVIDER_SITE_OTHER): Payer: 59

## 2020-10-24 ENCOUNTER — Ambulatory Visit (INDEPENDENT_AMBULATORY_CARE_PROVIDER_SITE_OTHER): Payer: 59 | Admitting: Nurse Practitioner

## 2020-10-24 VITALS — BP 128/79 | HR 85 | Ht 67.0 in | Wt 182.6 lb

## 2020-10-24 DIAGNOSIS — R197 Diarrhea, unspecified: Secondary | ICD-10-CM

## 2020-10-24 MED ORDER — SUPREP BOWEL PREP KIT 17.5-3.13-1.6 GM/177ML PO SOLN: 1.0000 | ORAL | 0 refills | Status: DC

## 2020-10-24 NOTE — Patient Instructions (Signed)
If you are age 49 or younger, your body mass index should be between 19-25. Your Body mass index is 28.6 kg/m. If this is out of the aformentioned range listed, please consider follow up with your Primary Care Provider.   LABS:  Lab work has been ordered for you today. Our lab is located in the basement. Press "B" on the elevator. The lab is located at the first door on the left as you exit the elevator.  HEALTHCARE LAWS AND MY CHART RESULTS: Due to recent changes in healthcare laws, you may see the results of your imaging and laboratory studies on MyChart before your provider has had a chance to review them.   We understand that in some cases there may be results that are confusing or concerning to you. Not all laboratory results come back in the same time frame and the provider may be waiting for multiple results in order to interpret others.  Please give [place] 48 hours in order for your provider to thoroughly review all the results before contacting the office for clarification of your results.   PROCEDURES:  You have been scheduled for a colonoscopy. Please follow the written instructions given to you at your visit today. Please pick up your prep supplies at the pharmacy within the next 1-3 days. If you use inhalers (even only as needed), please bring them with you on the day of your procedure.  OVER THE COUNTER MEDICATION Please purchase the following medications over the counter and take as directed:  Hardin Negus Bacteria Probiotic once daily. Imodium once daily as needed.  Please call our office if your symptoms worsen. It was great seeing you today! Thank you for entrusting me with your care and choosing [facility]  Noralyn Pick, CRNP

## 2020-10-24 NOTE — Addendum Note (Signed)
Addended by: Isaiah Serge D on: 10/24/2020 04:21 PM   Modules accepted: Orders

## 2020-10-25 ENCOUNTER — Other Ambulatory Visit: Payer: 59

## 2020-10-25 DIAGNOSIS — R197 Diarrhea, unspecified: Secondary | ICD-10-CM

## 2020-10-25 LAB — IGA: Immunoglobulin A: 246 mg/dL (ref 47–310)

## 2020-10-25 LAB — TISSUE TRANSGLUTAMINASE ABS,IGG,IGA
(tTG) Ab, IgA: <1 U/mL
(tTG) Ab, IgG: <1 U/mL

## 2020-10-25 LAB — C-REACTIVE PROTEIN: CRP: <1 mg/dL (ref 0.5–20.0)

## 2020-10-25 NOTE — Progress Notes (Signed)
Noted. Patient submitted GI pathogen panel specimen today, await results.

## 2020-10-25 NOTE — Progress Notes (Signed)
I agree with the above note, plan.  Colonoscopy OK as long as GI path panel is negative

## 2020-10-30 LAB — GI PROFILE, STOOL, PCR

## 2020-11-01 DIAGNOSIS — C801 Malignant (primary) neoplasm, unspecified: Secondary | ICD-10-CM

## 2020-11-01 HISTORY — DX: Malignant (primary) neoplasm, unspecified: C80.1

## 2020-11-02 ENCOUNTER — Encounter: Payer: 59 | Admitting: Gastroenterology

## 2020-11-11 ENCOUNTER — Telehealth: Payer: Self-pay | Admitting: Gastroenterology

## 2020-11-11 ENCOUNTER — Other Ambulatory Visit: Payer: Self-pay

## 2020-11-11 ENCOUNTER — Encounter: Payer: Self-pay | Admitting: Gastroenterology

## 2020-11-11 ENCOUNTER — Ambulatory Visit (AMBULATORY_SURGERY_CENTER): Payer: 59 | Admitting: Gastroenterology

## 2020-11-11 ENCOUNTER — Other Ambulatory Visit: Payer: 59

## 2020-11-11 ENCOUNTER — Other Ambulatory Visit: Payer: Self-pay | Admitting: Gastroenterology

## 2020-11-11 VITALS — BP 136/83 | HR 64 | Temp 97.8°F | Resp 21 | Ht 67.0 in | Wt 182.0 lb

## 2020-11-11 DIAGNOSIS — K6289 Other specified diseases of anus and rectum: Secondary | ICD-10-CM

## 2020-11-11 DIAGNOSIS — D120 Benign neoplasm of cecum: Secondary | ICD-10-CM | POA: Diagnosis not present

## 2020-11-11 DIAGNOSIS — D123 Benign neoplasm of transverse colon: Secondary | ICD-10-CM | POA: Diagnosis not present

## 2020-11-11 DIAGNOSIS — C20 Malignant neoplasm of rectum: Secondary | ICD-10-CM

## 2020-11-11 DIAGNOSIS — D124 Benign neoplasm of descending colon: Secondary | ICD-10-CM | POA: Diagnosis not present

## 2020-11-11 DIAGNOSIS — R197 Diarrhea, unspecified: Secondary | ICD-10-CM

## 2020-11-11 HISTORY — PX: COLONOSCOPY: SHX174

## 2020-11-11 MED ORDER — SODIUM CHLORIDE 0.9 % IV SOLN: 500.0000 mL | Freq: Once | INTRAVENOUS | Status: DC

## 2020-11-11 NOTE — Progress Notes (Signed)
A and O x3. Report to RN. Tolerated MAC anesthesia well.

## 2020-11-11 NOTE — Progress Notes (Signed)
Called to room to assist during endoscopic procedure.  Patient ID and intended procedure confirmed with present staff. Received instructions for my participation in the procedure from the performing physician.  

## 2020-11-11 NOTE — Telephone Encounter (Signed)
Inbound call from patient requesting a call back please in regards to CT scan.

## 2020-11-11 NOTE — Op Note (Signed)
[facility] Patient Name: Cesar Herrera Procedure Date: 11/11/2020 10:55 AM MRN: [id number] Endoscopist: Milus Banister , MD Age: 50 Referring MD:  Date of Birth: 1970/12/09 Gender: Male Account #: [id number] Procedure:                Colonoscopy Indications:              Intermittent loose stools, intermittent minor                            rectal bleeding Medicines:                Monitored Anesthesia Care Procedure:                Pre-Anesthesia Assessment:                           - Prior to the procedure, a History and Physical                            was performed, and patient medications and                            allergies were reviewed. The patient's tolerance of                            previous anesthesia was also reviewed. The risks                            and benefits of the procedure and the sedation                            options and risks were discussed with the patient.                            All questions were answered, and informed consent                            was obtained. Prior Anticoagulants: The patient has                            taken no previous anticoagulant or antiplatelet                            agents. ASA Grade Assessment: II - A patient with                            mild systemic disease. After reviewing the risks                            and benefits, the patient was deemed in                            satisfactory condition to undergo the procedure.  After obtaining informed consent, the colonoscope                            was passed under direct vision. Throughout the                            procedure, the patient's blood pressure, pulse, and                            oxygen saturations were monitored continuously. The                            Olympus CF-HQ190L (Serial# [id number]) Colonoscope was                            introduced through the anus and advanced to the  the                            cecum, identified by appendiceal orifice and                            ileocecal valve. The colonoscopy was performed                            without difficulty. The patient tolerated the                            procedure well. The quality of the bowel                            preparation was good. The ileocecal valve,                            appendiceal orifice, and rectum were photographed. Scope In: 11:11:20 AM Scope Out: 11:26:20 AM Scope Withdrawal Time: 0 hours 11 minutes 59 seconds  Total Procedure Duration: 0 hours 15 minutes 0 seconds  Findings:                 Three sessile polyps were found in the descending                            colon, transverse colon and cecum. The polyps were                            2 to 5 mm in size. These polyps were removed with a                            cold snare. Resection was complete, but the polyp                            tissue was only partially retrieved (2/3 polyps                            retrieved).  jar1                           A fungating and ulcerated non-obstructing                            medium-sized mass was found in the mid rectum with                            distal edge 7cm from the anal verge. The mass was                            partially circumferential (involving one-third of                            the lumen circumference) and it measured 3cm                            across. This was biopsied with a cold forceps for                            histology and then the distal end of the mass was                            labeled with two submucosal injections or Spot. jar                            The exam was otherwise without abnormality on                            direct and retroflexion views. Complications:            No immediate complications. Estimated blood loss:                            None. Estimated Blood Loss:     Estimated blood loss:  none. Impression:               - Three 2 to 5 mm polyps in the descending colon,                            in the transverse colon and in the cecum, removed                            with a cold snare. Complete resection. 2/3 polyps                            retrieved, sent to pathology.                           - Rectal tumor that appears malignant, distal edge                            7cm from the anal verge. This was biopsied and  then                            labeled with Spot submucosal tattoo.                           - The examination was otherwise normal on direct                            and retroflexion views. Recommendation:           - Patient has a contact number available for                            emergencies. The signs and symptoms of potential                            delayed complications were discussed with the                            patient. Return to normal activities tomorrow.                            Written discharge instructions were provided to the                            patient.                           - Resume previous diet.                           - Continue present medications.                           - Await pathology results.                           - Expedited referral to [facility] for newly                            diagnosed rectal cancer.                           - CT scan chest, abdomen, pelvis with IV and oral                            contrast for rectal cancer staging.                           - MRI pelvis for rectal cancer stating.                           - CEA level. Milus Banister, MD 11/11/2020 11:36:51 AM This report has been signed electronically.

## 2020-11-11 NOTE — Telephone Encounter (Signed)
Order entered for CT chest, abd, pelvis, and MRI order entered.  Referral to [facility] has been made. Message sent to have the imaging scheduled. Phone lines are down.  Message sent to the pt that schedulers will be calling with the appt and he should let us know if he does not hear from them by the first of next week.

## 2020-11-11 NOTE — Patient Instructions (Signed)
Please read handouts provided. Continue present medications. Await pathology results.   YOU HAD AN ENDOSCOPIC PROCEDURE TODAY AT THE [facility]:   Refer to the procedure report that was given to you for any specific questions about what was found during the examination.  If the procedure report does not answer your questions, please call your gastroenterologist to clarify.  If you requested that your care partner not be given the details of your procedure findings, then the procedure report has been included in a sealed envelope for you to review at your convenience later.  YOU SHOULD EXPECT: Some feelings of bloating in the abdomen. Passage of more gas than usual.  Walking can help get rid of the air that was put into your GI tract during the procedure and reduce the bloating. If you had a lower endoscopy (such as a colonoscopy or flexible sigmoidoscopy) you may notice spotting of blood in your stool or on the toilet paper. If you underwent a bowel prep for your procedure, you may not have a normal bowel movement for a few days.  Please Note:  You might notice some irritation and congestion in your nose or some drainage.  This is from the oxygen used during your procedure.  There is no need for concern and it should clear up in a day or so.  SYMPTOMS TO REPORT IMMEDIATELY:  Following lower endoscopy (colonoscopy or flexible sigmoidoscopy):  Excessive amounts of blood in the stool  Significant tenderness or worsening of abdominal pains  Swelling of the abdomen that is new, acute  Fever of 100F or higher   For urgent or emergent issues, a gastroenterologist can be reached at any hour by calling [phone]. Do not use MyChart messaging for urgent concerns.    DIET:  We do recommend a small meal at first, but then you may proceed to your regular diet.  Drink plenty of fluids but you should avoid alcoholic beverages for 24 hours.  ACTIVITY:  You should plan to take it easy  for the rest of today and you should NOT DRIVE or use heavy machinery until tomorrow (because of the sedation medicines used during the test).    FOLLOW UP: Our staff will call the number listed on your records 48-72 hours following your procedure to check on you and address any questions or concerns that you may have regarding the information given to you following your procedure. If we do not reach you, we will leave a message.  We will attempt to reach you two times.  During this call, we will ask if you have developed any symptoms of COVID 19. If you develop any symptoms (ie: fever, flu-like symptoms, shortness of breath, cough etc.) before then, please call [phone].  If you test positive for Covid 19 in the 2 weeks post procedure, please call and report this information to us.    If any biopsies were taken you will be contacted by phone or by letter within the next 1-3 weeks.  Please call us at [phone] if you have not heard about the biopsies in 3 weeks.    SIGNATURES/CONFIDENTIALITY: You and/or your care partner have signed paperwork which will be entered into your electronic medical record.  These signatures attest to the fact that that the information above on your After Visit Summary has been reviewed and is understood.  Full responsibility of the confidentiality of this discharge information lies with you and/or your care-partner.  

## 2020-11-14 LAB — CANCER ANTIGEN 19-9: CA 19-9: 10 U/mL (ref <34)

## 2020-11-15 ENCOUNTER — Other Ambulatory Visit: Payer: Self-pay

## 2020-11-15 ENCOUNTER — Ambulatory Visit (HOSPITAL_COMMUNITY)
Admission: RE | Admit: 2020-11-15 | Discharge: 2020-11-15 | Disposition: A | Discharge status: Home / Self Care | Payer: 59 | Source: Ambulatory Visit | Attending: Gastroenterology | Admitting: Gastroenterology

## 2020-11-15 DIAGNOSIS — C20 Malignant neoplasm of rectum: Secondary | ICD-10-CM | POA: Insufficient documentation

## 2020-11-15 DIAGNOSIS — C189 Malignant neoplasm of colon, unspecified: Secondary | ICD-10-CM | POA: Diagnosis not present

## 2020-11-15 DIAGNOSIS — K6289 Other specified diseases of anus and rectum: Secondary | ICD-10-CM | POA: Diagnosis not present

## 2020-11-15 DIAGNOSIS — D492 Neoplasm of unspecified behavior of bone, soft tissue, and skin: Secondary | ICD-10-CM | POA: Diagnosis not present

## 2020-11-15 IMAGING — MR MR PELVIS W/O CM
7 series · 48 of 48 positions shown · non-contrast
Comparison: CT urogram from [DATE], no recent imaging is
available for comparison.

CLINICAL DATA: Colorectal cancer staging.

EXAM:
MRI PELVIS WITHOUT CONTRAST
TECHNIQUE: Multiplanar multisequence MR imaging of the pelvis was performed. No
intravenous contrast was administered. Small amount of US gel was
administered per rectum to optimize tumor evaluation.

[Series 1: 3 plane loc · axial · 3.0mm · 1.56mm/px · z∈[-18,+200]mm · 4 of 27 slices shown]
[im 1/27]
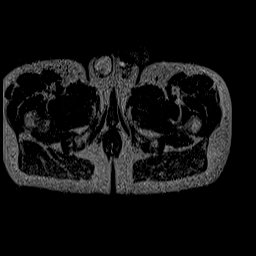
[im 9/27]
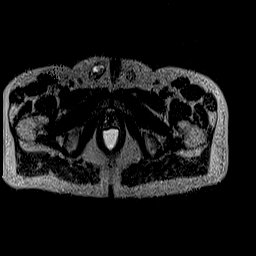
[im 18/27]
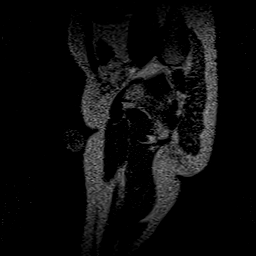
[im 27/27]
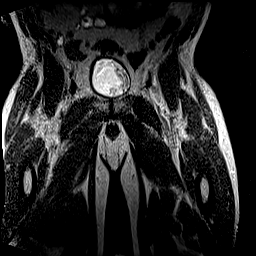

[Series 2: T2 · sagittal · 3.0mm · 0.70mm/px · 6 of 40 slices shown (1 of 4)]
[im 1/40]
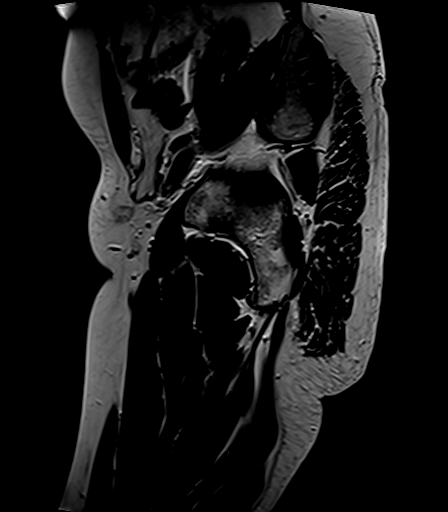
[im 8/40]
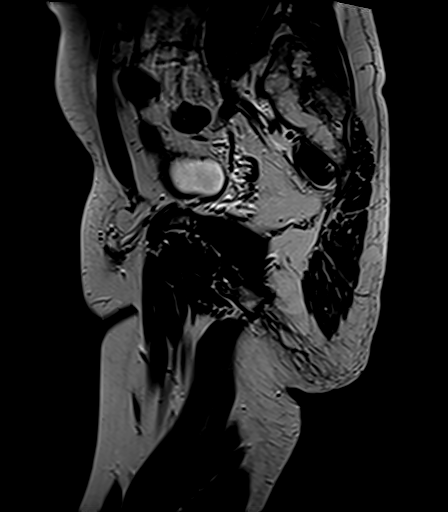
[im 16/40]
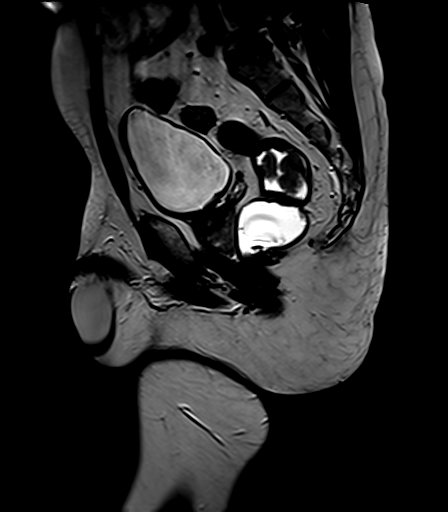
[im 24/40]
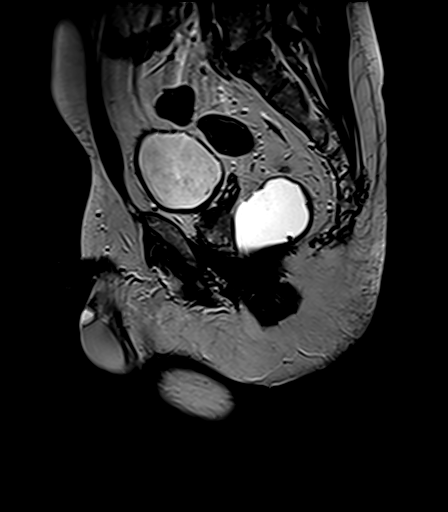
[im 32/40]
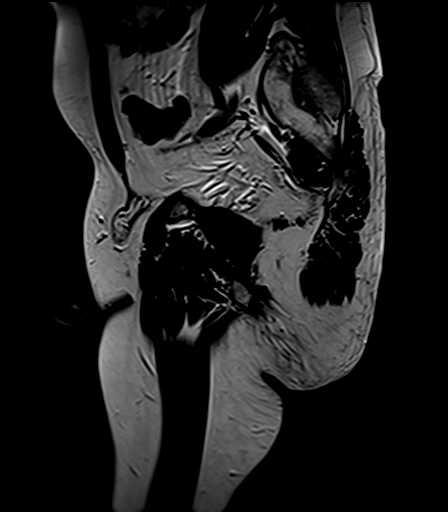
[im 40/40]
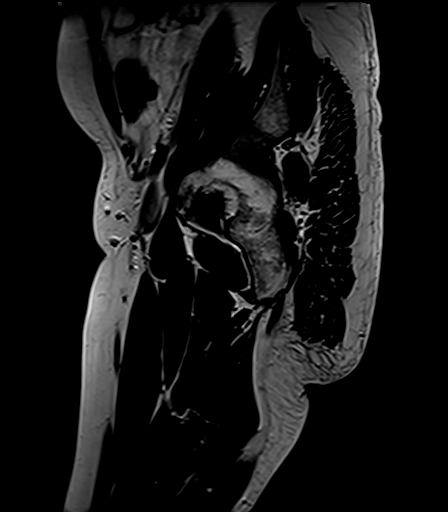

[Series 3: DWI · axial · 5.0mm · 1.48mm/px · z∈[-89,+169]mm · 12 of 88 slices shown (1 of 2)]
[im 1/88]
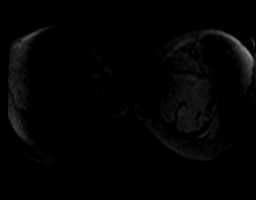
[im 8/88]
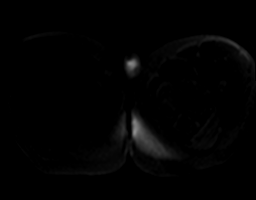
[im 16/88]
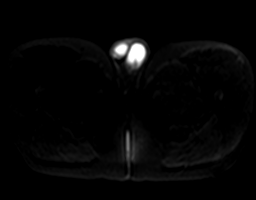
[im 24/88]
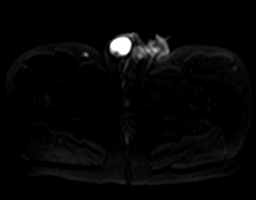
[im 32/88]
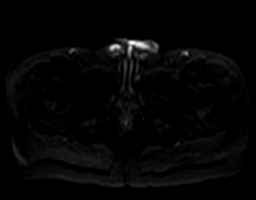
[im 40/88]
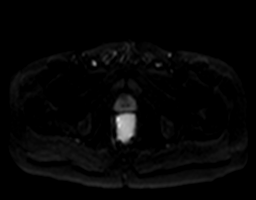
[im 48/88]
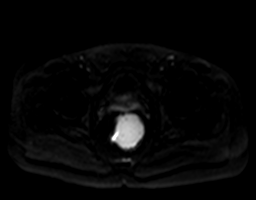
[im 56/88]
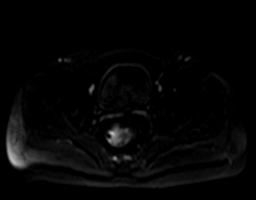
[im 64/88]
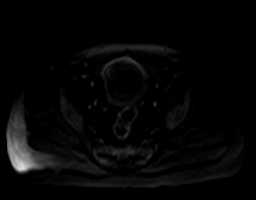
[im 72/88]
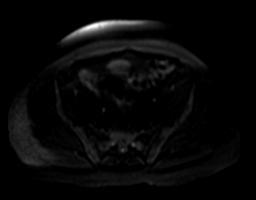
[im 80/88]
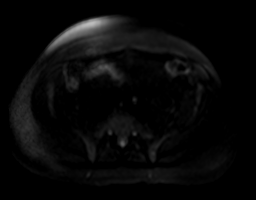
[im 88/88]
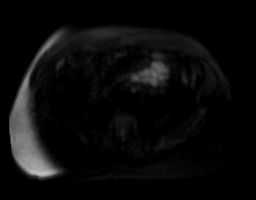

[Series 4: DWI · axial · 5.0mm · 1.48mm/px · z∈[-89,+169]mm · 6 of 44 slices shown (2 of 2)]
[im 1/44]
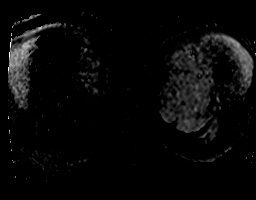
[im 9/44]
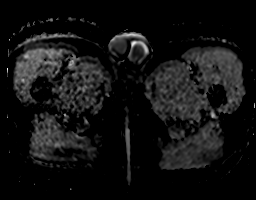
[im 18/44]
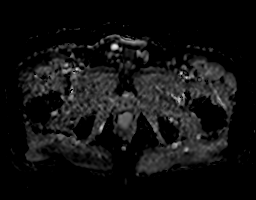
[im 26/44]
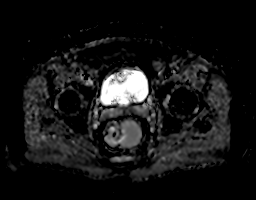
[im 35/44]
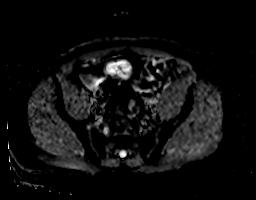
[im 44/44]
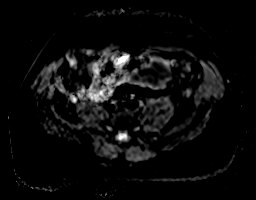

[Series 5: T2 · axial · 5.0mm · 0.99mm/px · z∈[-73,+125]mm · 5 of 34 slices shown (2 of 4)]
[im 1/34]
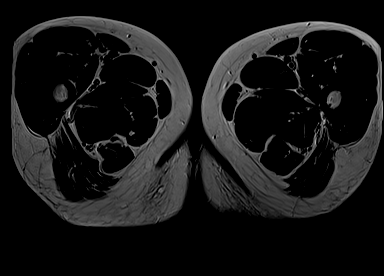
[im 9/34]
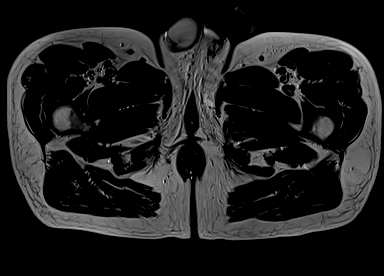
[im 17/34]
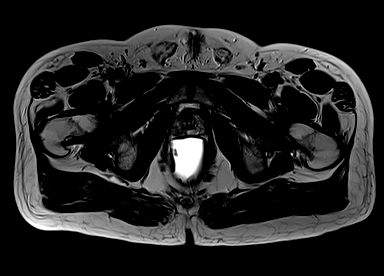
[im 25/34]
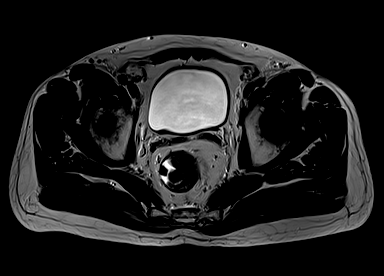
[im 34/34]
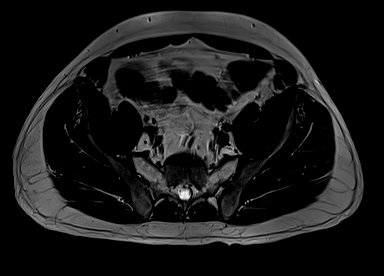

[Series 6: T2 · coronal · 3.0mm · 0.70mm/px · 7 of 47 slices shown (3 of 4)]
[im 1/47]
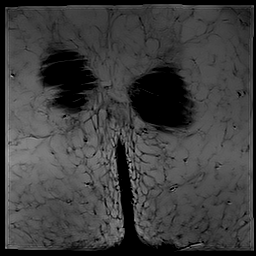
[im 8/47]
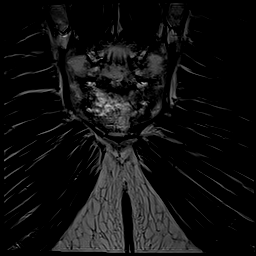
[im 16/47]
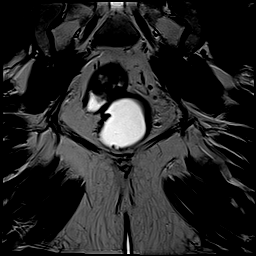
[im 24/47]
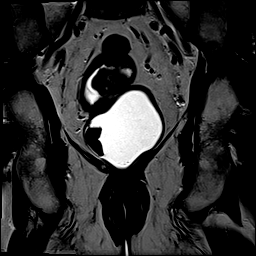
[im 31/47]
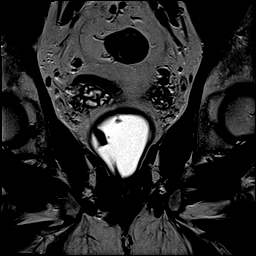
[im 39/47]
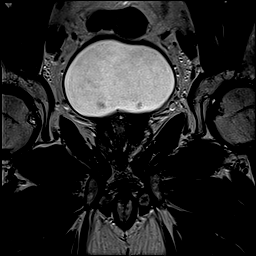
[im 47/47]
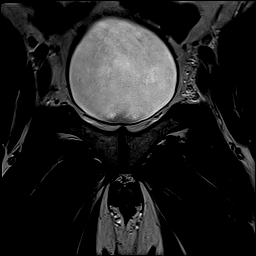

[Series 7: T2 · axial · 3.0mm · 0.70mm/px · z∈[-44,+127]mm · 8 of 58 slices shown (4 of 4)]
[im 1/58]
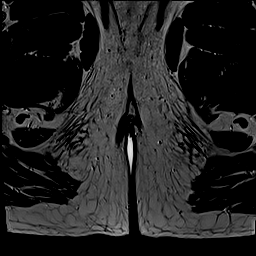
[im 9/58]
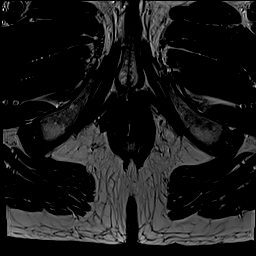
[im 17/58]
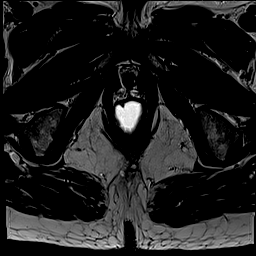
[im 25/58]
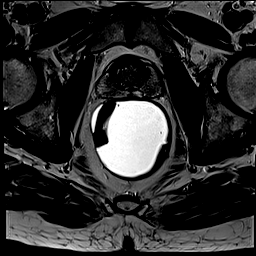
[im 33/58]
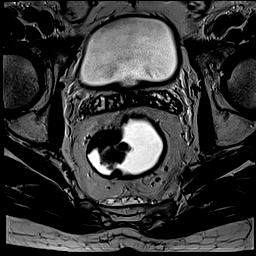
[im 41/58]
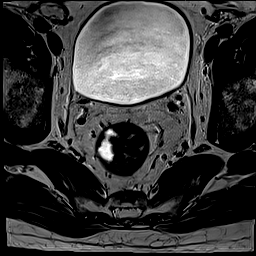
[im 49/58]
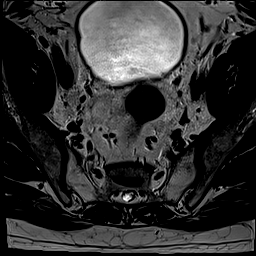
[im 58/58]
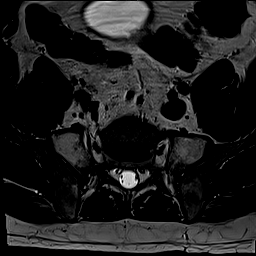

[48 of 48 positions shown; findings below may reference images not displayed]

FINDINGS: TUMOR LOCATION

Tumor distance from Anal Verge/Skin Surface:  10.4 cm

Tumor distance to Internal Anal Sphincter: 6.4 cm

TUMOR DESCRIPTION

Circumferential Extent: Eccentric favoring the LEFT rectum with
polypoid extension of tumor into the lumen of the rectum. Distortion
of the rectum due to tumor involvement along the LEFT lateral
aspect.

Tumor Length: 3.5 cm

T - CATEGORY

Extension through Muscularis Propria: T3b suspected on image 20 of
series 2 where signal is seen at the margin of the distorted also on
image 26 of series 7, extending approximately 2-3 mm beyond the
confines of the rectal wall.

Shortest Distance of any tumor/node from Mesorectal Fascia: 10 mm

Extramural Vascular Invasion/Tumor Thrombus: No

Invasion of Anterior Peritoneal Reflection: No

Involvement of Adjacent Organs or Pelvic Sidewall: No

Levator Ani Involvement: No

N - CATEGORY

Mesorectal Lymph Nodes >=5mm: Single LEFT mesorectal lymph node at 5
mm. (Image 19 of series 7)

Extra-mesorectal Lymphadenopathy: No

Other:

Urinary Tract: Urinary bladder is grossly normal.

Bowel: As above.  No visualized acute bowel process.

Vascular/Lymphatic: As above.

Reproductive: Heterogeneity of the prostate, nonspecific finding on
study not performed for prostate evaluation.

Other: No ascites

Musculoskeletal: No suspicious musculoskeletal finding.
IMPRESSION: Signs of potential T3 B disease, tortuosity of the rectum at the
level of the tumor makes assessment difficult, distorted by the
polypoid eccentric tumor in the high rectum as described.

N1 disease.

No extramural venous invasion. Tumor extends just to the level of
the APR in this center below this level.

## 2020-11-16 ENCOUNTER — Telehealth: Payer: Self-pay | Admitting: Internal Medicine

## 2020-11-16 ENCOUNTER — Telehealth: Payer: Self-pay

## 2020-11-16 DIAGNOSIS — K6289 Other specified diseases of anus and rectum: Secondary | ICD-10-CM

## 2020-11-16 NOTE — Telephone Encounter (Signed)
Patient had an MRI done yesterday and it showed a tumor. They were referred to Dr. Leighton Ruff with [facility] and have an appointment on Monday but they wanted to also see if they could also get a referral to Dr. Cheryll Cockayne at [facility]. They were hoping we could do the referral before he came in on Friday so they could get an appointment with him sometime next week as well.

## 2020-11-16 NOTE — Telephone Encounter (Signed)
Message left and mychart message sent

## 2020-11-16 NOTE — Telephone Encounter (Signed)
Referral ordered

## 2020-11-16 NOTE — Telephone Encounter (Signed)
  Follow up Call-  Call back number 11/11/2020  Post procedure Call Back phone  # [phone]  Permission to leave phone message Yes  Some recent data might be hidden     Patient questions:  Do you have a fever, pain , or abdominal swelling? No. Pain Score  0 *  Have you tolerated food without any problems? Yes.    Have you been able to return to your normal activities? Yes.    Do you have any questions about your discharge instructions: Diet   No. Medications  No. Follow up visit  No.  Do you have questions or concerns about your Care? No.  Actions: * If pain score is 4 or above: No action needed, pain <4.  1. Have you developed a fever since your procedure? no  2.   Have you had an respiratory symptoms (SOB or cough) since your procedure? no  3.   Have you tested positive for COVID 19 since your procedure no  4.   Have you had any family members/close contacts diagnosed with the COVID 19 since your procedure?  no   If yes to any of these questions please route to Joylene John, RN and Joella Prince, RN

## 2020-11-17 ENCOUNTER — Encounter (HOSPITAL_COMMUNITY): Payer: Self-pay

## 2020-11-17 ENCOUNTER — Ambulatory Visit (HOSPITAL_COMMUNITY)
Admission: RE | Admit: 2020-11-17 | Discharge: 2020-11-17 | Disposition: A | Discharge status: Home / Self Care | Payer: 59 | Source: Ambulatory Visit | Attending: Gastroenterology | Admitting: Gastroenterology

## 2020-11-17 ENCOUNTER — Other Ambulatory Visit: Payer: Self-pay

## 2020-11-17 DIAGNOSIS — K639 Disease of intestine, unspecified: Secondary | ICD-10-CM | POA: Diagnosis not present

## 2020-11-17 DIAGNOSIS — M25712 Osteophyte, left shoulder: Secondary | ICD-10-CM | POA: Diagnosis not present

## 2020-11-17 DIAGNOSIS — C20 Malignant neoplasm of rectum: Secondary | ICD-10-CM | POA: Diagnosis not present

## 2020-11-17 DIAGNOSIS — R911 Solitary pulmonary nodule: Secondary | ICD-10-CM | POA: Diagnosis not present

## 2020-11-17 DIAGNOSIS — K769 Liver disease, unspecified: Secondary | ICD-10-CM | POA: Diagnosis not present

## 2020-11-17 DIAGNOSIS — R918 Other nonspecific abnormal finding of lung field: Secondary | ICD-10-CM | POA: Diagnosis not present

## 2020-11-17 HISTORY — DX: Malignant (primary) neoplasm, unspecified: C80.1

## 2020-11-17 IMAGING — CT CT CHEST-ABD-PELV W/ CM
2 of 5 series · 13 of 36 positions shown, 15 images · IV contrast (APPLIED)
Comparison: MRI pelvis [DATE]

CLINICAL DATA: Staging of rectal cancer

EXAM:
CT CHEST, ABDOMEN, AND PELVIS WITH CONTRAST
TECHNIQUE: Multidetector CT imaging of the chest, abdomen and pelvis was
performed following the standard protocol during bolus
administration of intravenous contrast.
CONTRAST:  100mL OMNIPAQUE IOHEXOL 300 MG/ML  SOLN

[Series 2: cap with · axial · 0.74mm/px · z∈[-574,-48]mm · 10 of 129 slices shown, 12 images]
[im 12/129  mediastinal]
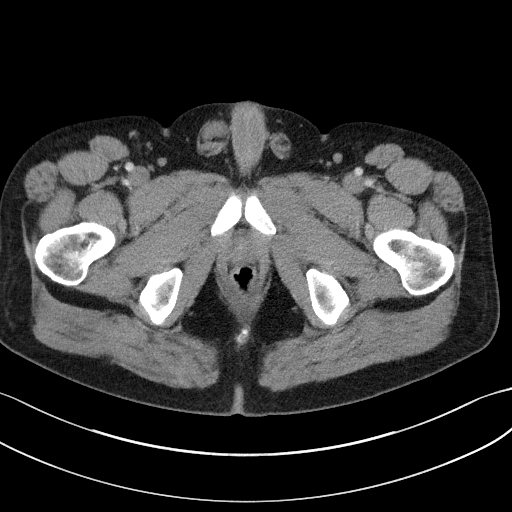
[im 12/129  bone]
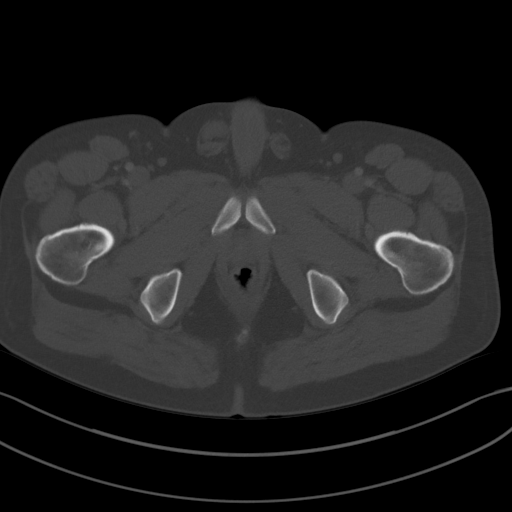
[im 24/129  mediastinal]
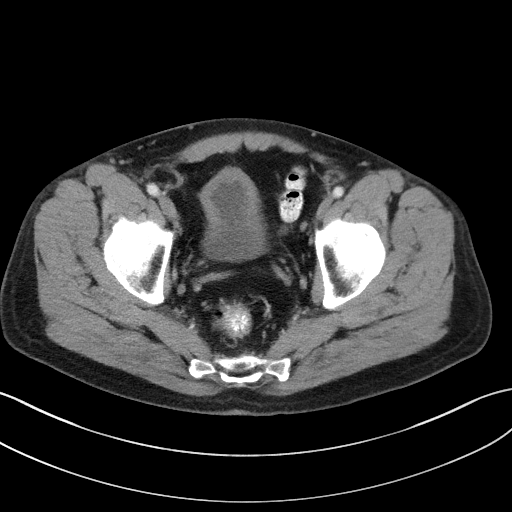
[im 35/129  mediastinal]
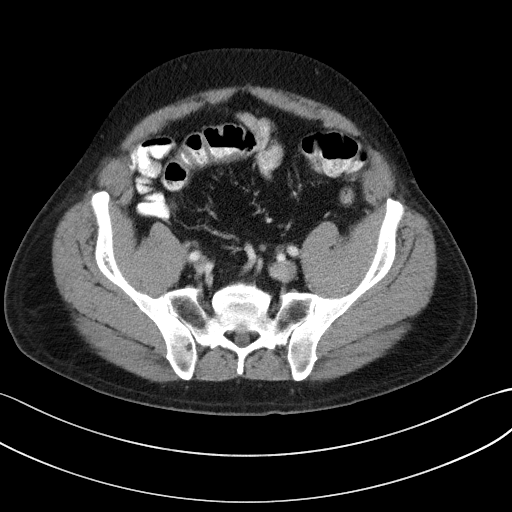
[im 47/129  mediastinal]
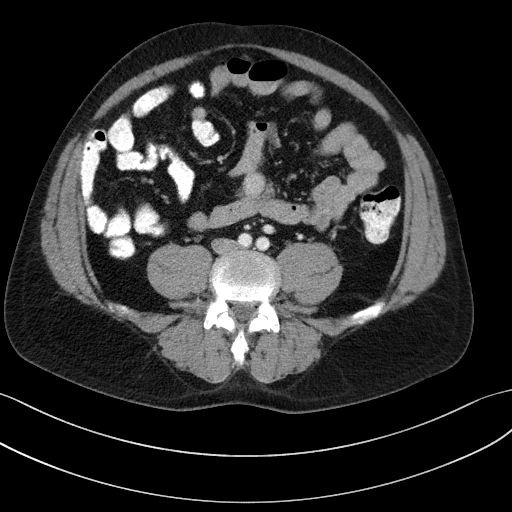
[im 59/129  mediastinal]
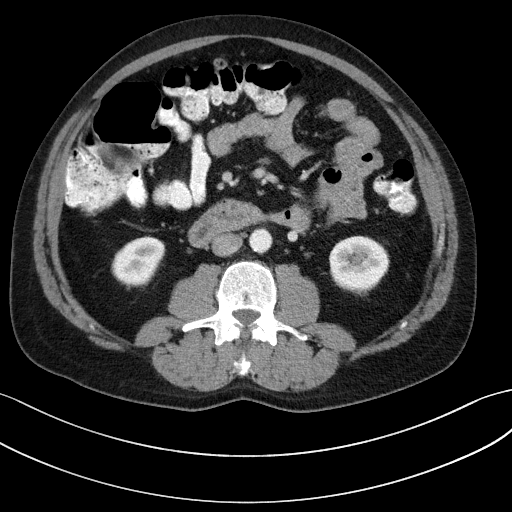
[im 70/129  mediastinal]
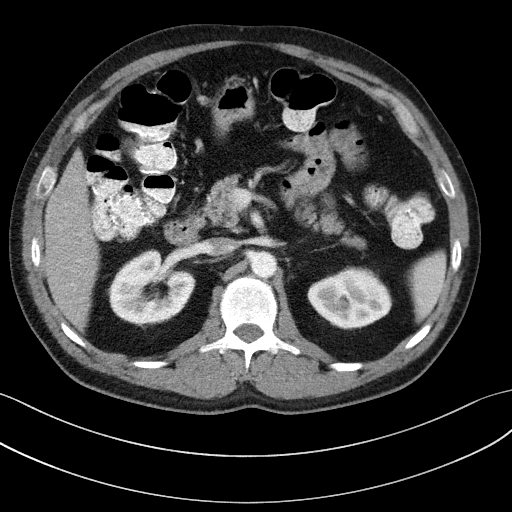
[im 82/129  mediastinal]
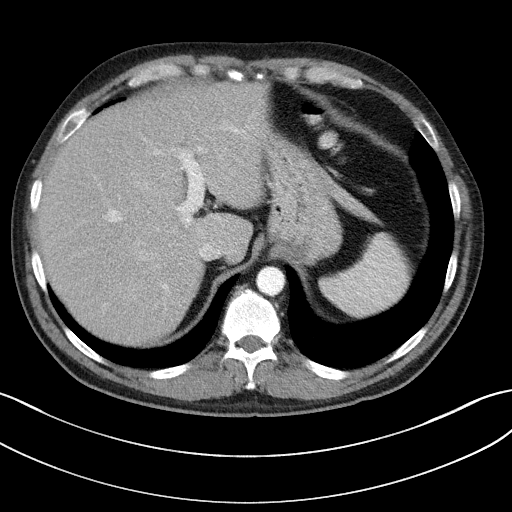
[im 94/129  mediastinal]
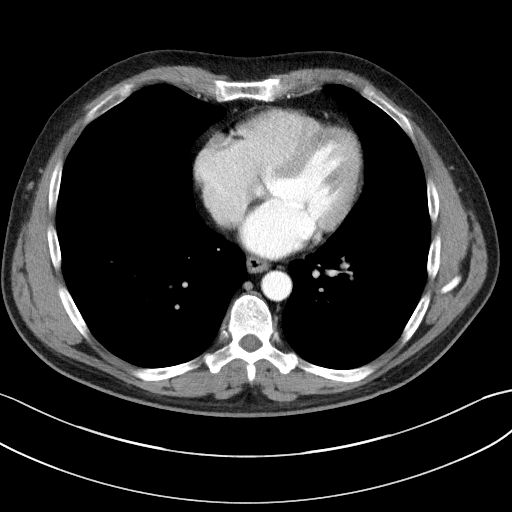
[im 105/129  mediastinal]
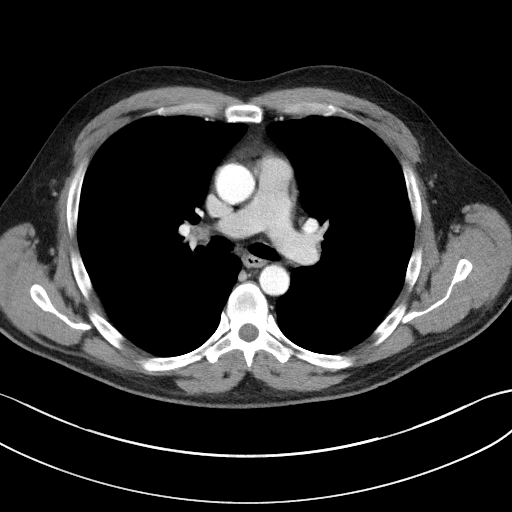
[im 105/129  bone]
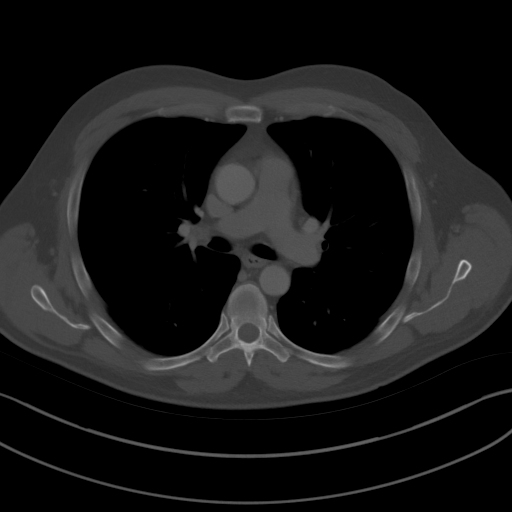
[im 117/129  mediastinal]
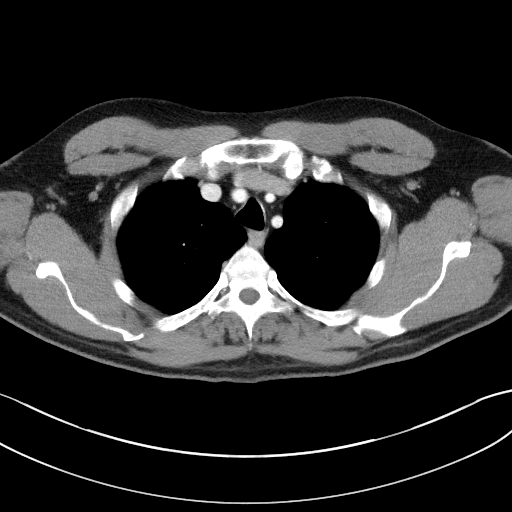

[Series 4: coronals · coronal · 0.85mm/px · 3 of 146 slices shown]
[im 30/146  mediastinal]
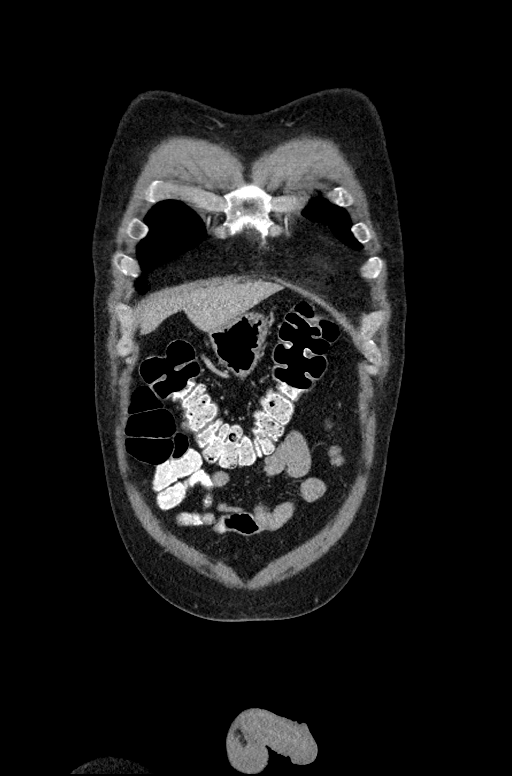
[im 59/146  mediastinal]
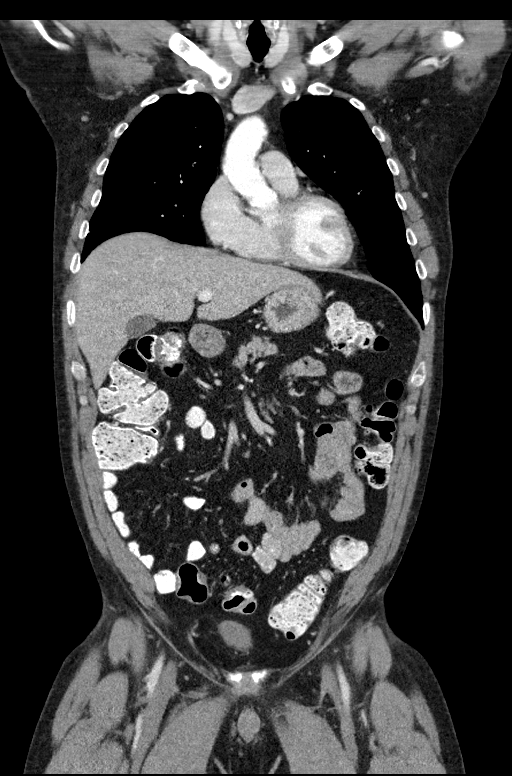
[im 88/146  mediastinal]
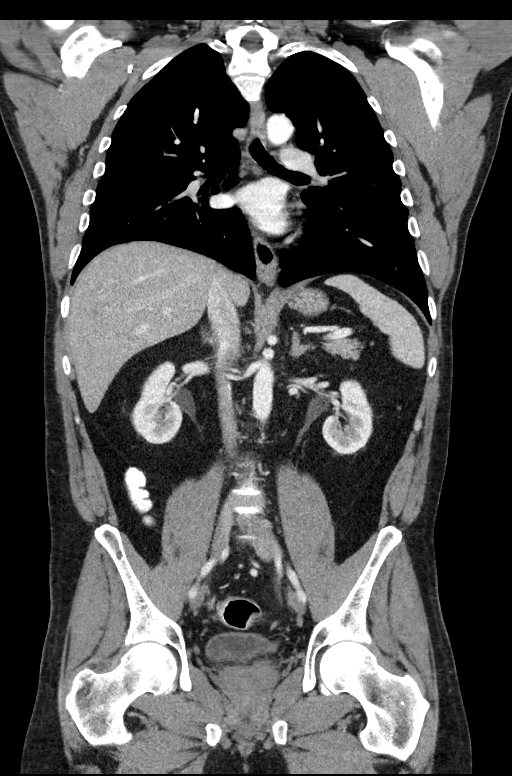

[13 of 36 positions shown; findings below may reference images not displayed]

FINDINGS: CT CHEST FINDINGS

Cardiovascular: Unremarkable

Mediastinum/Nodes: Unremarkable

Lungs/Pleura: 6 by 4 by 4 mm (volume = 50 mm^3) right middle lobe
nodule, image 79 series 6. 2 mm right middle lobe subpleural nodule,
image 91 series 6. 2 mm right lower lobe subpleural nodule, image 89
series 6.

Musculoskeletal: Mild spurring of the right and left glenoid.

CT ABDOMEN PELVIS FINDINGS

Hepatobiliary: 1.4 by 1.2 by 0.9 cm hypodense lesion in the right
hepatic lobe on image 50 of series 2, poorly seen on the delayed
images, presents on the [DATE] noncontrast CT uncertain,
metastatic lesion is not excluded. Gallbladder unremarkable.

Pancreas: Unremarkable

Spleen: Unremarkable

Adrenals/Urinary Tract: Unremarkable

Stomach/Bowel: Known posterior rectal wall mass, image 109 series 2,
recently characterized by pelvic MRI.

Vascular/Lymphatic: No pathologic adenopathy is identified. The left
mesorectal 5 mm in short axis lymph node shown on MRI was better
shown on the prior exam and only faintly apparent on image 106 of
series 2 of today's CT.

Reproductive: Unremarkable

Other: No supplemental non-categorized findings.

Musculoskeletal: Lucent lesion of the left posterior L2 vertebral
body potentially extending slightly into the pedicle, measuring
by 1.4 cm on image 116 of series 5. This previously measured about
1.6 by 1.3 cm on [DATE], accordingly making it unlikely to be a
metastatic lesion. Given the fairly modest increase in size of the
last 11 years this is most likely a hemangioma.
IMPRESSION: 1. Known rectal mass with a 5 mm in short axis left mesorectal lymph
node better shown on the prior exam and only faintly apparent on the
CT.
[DATE] by 1.2 by 0.9 cm hypodense lesion in the right hepatic lobe
on image 50 of series 2, poorly seen on the delayed images. This
lesion is indeterminate by CT and could be benign or malignant.
Imaging possibilities for with further workup might include hepatic
protocol MRI with and without contrast or PET-CT.
3. Lucent lesion of the left posterior L2 vertebral body potentially
extending slightly into the pedicle, measuring 1.9 by 1.4 cm on
image 116 of series 5. This previously measured 1.6 by 1.3 cm on
[DATE], accordingly making it unlikely to be a metastatic
lesion. Given the fairly modest increase in size of the last 11
years this is most likely a hemangioma.
4. Small right middle lobe and right lower lobe pulmonary nodules
are likely benign but merit surveillance in this context.

## 2020-11-17 MED ORDER — IOHEXOL 300 MG/ML  SOLN
100.0000 mL | Freq: Once | INTRAMUSCULAR | Status: AC | PRN
  Administered 2020-11-17: 100 mL via INTRAVENOUS

## 2020-11-17 NOTE — Progress Notes (Signed)
Subjective:    Patient ID: Cesar Herrera, male    DOB: 03/17/1971, 50 y.o.   MRN: [id number]  HPI The patient is here for an acute visit to discuss his recent tests.  He saw GI on 2/21 with complaints of passing loose stool in am for past 4-5 months which has worsened.  He has increased urgency and passes 2nd loose BM one hr later.  He occasionally will pass a soft, formed stool in the evenings.  He sometimes has urge to have a BM but only passes gas.  No blood in stool, but occ blood on tissue.  No abdominal pain or mucous.    He wanted to come in to discuss a few things regarding colonoscopy, scans.  He is a little anxious with everything going on and wonders if he should have something as needed.  He would like to avoid it, but thinks he may need something.  He does not need something on a daily basis.  He has not taken anything in the past.  CT CHEST ABDOMEN PELVIS W CONTRAST CLINICAL DATA:  Staging of rectal cancer  EXAM: CT CHEST, ABDOMEN, AND PELVIS WITH CONTRAST  TECHNIQUE: Multidetector CT imaging of the chest, abdomen and pelvis was performed following the standard protocol during bolus administration of intravenous contrast.  CONTRAST:  125mL OMNIPAQUE IOHEXOL 300 MG/ML  SOLN  COMPARISON:  MRI pelvis 11/15/2020  FINDINGS: CT CHEST FINDINGS  Cardiovascular: Unremarkable  Mediastinum/Nodes: Unremarkable  Lungs/Pleura: 6 by 4 by 4 mm (volume = 50 mm^3) right middle lobe nodule, image 79 series 6. 2 mm right middle lobe subpleural nodule, image 91 series 6. 2 mm right lower lobe subpleural nodule, image 89 series 6.  Musculoskeletal: Mild spurring of the right and left glenoid.  CT ABDOMEN PELVIS FINDINGS  Hepatobiliary: 1.4 by 1.2 by 0.9 cm hypodense lesion in the right hepatic lobe on image 50 of series 2, poorly seen on the delayed images, presents on the 05/29/2010 noncontrast CT uncertain, metastatic lesion is not excluded. Gallbladder  unremarkable.  Pancreas: Unremarkable  Spleen: Unremarkable  Adrenals/Urinary Tract: Unremarkable  Stomach/Bowel: Known posterior rectal wall mass, image 109 series 2, recently characterized by pelvic MRI.  Vascular/Lymphatic: No pathologic adenopathy is identified. The left mesorectal 5 mm in short axis lymph node shown on MRI was better shown on the prior exam and only faintly apparent on image 106 of series 2 of today's CT.  Reproductive: Unremarkable  Other: No supplemental non-categorized findings.  Musculoskeletal: Lucent lesion of the left posterior L2 vertebral body potentially extending slightly into the pedicle, measuring 1.9 by 1.4 cm on image 116 of series 5. This previously measured about 1.6 by 1.3 cm on 03/28/2010, accordingly making it unlikely to be a metastatic lesion. 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  IMPRESSION: 1. Known rectal mass with a 5 mm in short axis left mesorectal lymph node better shown on the prior exam and only faintly apparent on the CT. 2. 1.4 by 1.2 by 0.9 cm hypodense lesion in the right hepatic lobe on image 50 of series 2, poorly seen on the delayed images. This lesion is indeterminate by CT and could be benign or malignant. Imaging possibilities for with further workup might include hepatic protocol MRI with and without contrast or PET-CT. 3. Lucent lesion of the left posterior L2 vertebral body potentially extending slightly into the pedicle, measuring 1.9 by 1.4 cm on image  116 of series 5. This previously measured 1.6 by 1.3 cm on 03/28/2010, accordingly making it unlikely to be a metastatic lesion. 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 4. Small right middle lobe and right lower lobe pulmonary nodules are likely benign but merit surveillance in this context.  Electronically Signed   By: Van Clines  M.D.   On: 11/17/2020 10:40     Medications and allergies reviewed with patient and updated if appropriate.  Patient Active Problem List   Diagnosis Date Noted  . Diarrhea 10/24/2020  . Spasm of back muscles 07/28/2013  . Seasonal allergies 07/28/2013    Current Outpatient Medications on File Prior to Visit  Medication Sig Dispense Refill  . Multiple Vitamins-Minerals (MULTIVITAMIN) tablet Take 1 tablet by mouth daily.     No current facility-administered medications on file prior to visit.    Past Medical History:  Diagnosis Date  . Cancer ([place])   . H/O epididymitis 2011  . Seasonal allergies     Past Surgical History:  Procedure Laterality Date  . WISDOM TOOTH EXTRACTION      Social History   Socioeconomic History  . Marital status: Married    Spouse name: Not on file  . Number of children: Not on file  . Years of education: Not on file  . Highest education level: Not on file  Occupational History  . Not on file  Tobacco Use  . Smoking status: Former Smoker    Types: Cigarettes    Quit date: 07/29/2003    Years since quitting: 17.3  . Smokeless tobacco: Never Used  . Tobacco comment: smoked 1993-2004, up to < 4 cigarettes/ day (socially)  Vaping Use  . Vaping Use: Never used  Substance and Sexual Activity  . Alcohol use: Yes    Comment: 1-2 drinks , wine or beer  . Drug use: No  . Sexual activity: Yes  Other Topics Concern  . Not on file  Social History Narrative   Exercise: mountain biking, hiking, walking - tends to exercise in spurts, does some yoga   Social Determinants of Health   Financial Resource Strain: Not on file  Food Insecurity: Not on file  Transportation Needs: Not on file  Physical Activity: Not on file  Stress: Not on file  Social Connections: Not on file    Family History  Problem Relation Age of Onset  . Diabetes Maternal Uncle   . Diabetes Father   . Colon polyps Father   . Stroke Other        PG aunts & PG uncles   . Heart disease Paternal Grandfather   . Cancer Neg Hx   . Colon cancer Neg Hx   . Stomach cancer Neg Hx   . Esophageal cancer Neg Hx     Review of Systems     Objective:   Vitals:   11/18/20 1333  BP: 118/76  Pulse: 76  Temp: 98.3 F (36.8 C)  SpO2: 97%   BP Readings from Last 3 Encounters:  11/18/20 118/76  11/11/20 136/83  10/24/20 128/79   Wt Readings from Last 3 Encounters:  11/18/20 180 lb (81.6 kg)  11/11/20 182 lb (82.6 kg)  10/24/20 182 lb 9.6 oz (82.8 kg)   Body mass index is 28.19 kg/m.   Physical Exam Constitutional:      Appearance: Normal appearance.  Neurological:     Mental Status: He is alert.  Psychiatric:  Comments: As to be expected, slightly anxious at times            Assessment & Plan:    See Problem List for Assessment and Plan of chronic medical problems.    This visit occurred during the SARS-CoV-2 public health emergency.  Safety protocols were in place, including screening questions prior to the visit, additional usage of staff PPE, and extensive cleaning of exam room while observing appropriate contact time as indicated for disinfecting solutions.

## 2020-11-18 ENCOUNTER — Ambulatory Visit: Payer: 59 | Admitting: Internal Medicine

## 2020-11-18 ENCOUNTER — Other Ambulatory Visit: Payer: Self-pay

## 2020-11-18 ENCOUNTER — Encounter: Payer: Self-pay | Admitting: Internal Medicine

## 2020-11-18 VITALS — BP 118/76 | HR 76 | Temp 98.3°F | Ht 67.0 in | Wt 180.0 lb

## 2020-11-18 DIAGNOSIS — R4589 Other symptoms and signs involving emotional state: Secondary | ICD-10-CM | POA: Insufficient documentation

## 2020-11-18 DIAGNOSIS — C20 Malignant neoplasm of rectum: Secondary | ICD-10-CM

## 2020-11-18 DIAGNOSIS — F418 Other specified anxiety disorders: Secondary | ICD-10-CM | POA: Diagnosis not present

## 2020-11-18 DIAGNOSIS — D490 Neoplasm of unspecified behavior of digestive system: Secondary | ICD-10-CM | POA: Diagnosis not present

## 2020-11-18 MED ORDER — ALPRAZOLAM 0.25 MG PO TABS: 0.2500 mg | ORAL_TABLET | Freq: Two times a day (BID) | ORAL | 0 refills | Status: DC | PRN

## 2020-11-18 NOTE — Assessment & Plan Note (Signed)
New problem Recently he has been experiencing anxiety, which is expected based on everything that has happened in the past week Discussed options Start alprazolam 0.25 mg twice daily as needed for anxiety Advised him to call if this is not effective or if we need to do something different

## 2020-11-18 NOTE — Patient Instructions (Signed)
Take xanax if needed.   Have blood work downstairs.

## 2020-11-18 NOTE — Assessment & Plan Note (Signed)
Acute Probable rectal cancer Awaiting pathology to confirm, but GI is confident that is what he has Discussed some of his tests Discussed in seeing surgery and what the plan may be going forward He is anxious to get started with treatment CEA is needed-ordered today

## 2020-11-21 LAB — CEA: CEA: 1.4 ng/mL

## 2020-11-22 NOTE — Progress Notes (Signed)
Spoke with patient regarding referral we received.  I have scheduled him for tomorrow 3/23 at 11:00 am with Cira Rue NP and Dr. [name].  He is aware of our location.  I asked him to arrive at least 20 minutes early for registration.  He is also scheduled to see RT later same day.

## 2020-11-23 ENCOUNTER — Ambulatory Visit
Admission: RE | Admit: 2020-11-23 | Discharge: 2020-11-23 | Disposition: A | Discharge status: Home / Self Care | Payer: 59 | Source: Ambulatory Visit | Attending: Radiation Oncology | Admitting: Radiation Oncology

## 2020-11-23 ENCOUNTER — Encounter: Payer: Self-pay | Admitting: Radiation Oncology

## 2020-11-23 ENCOUNTER — Other Ambulatory Visit: Payer: Self-pay

## 2020-11-23 ENCOUNTER — Inpatient Hospital Stay: Payer: 59 | Attending: Nurse Practitioner | Admitting: Nurse Practitioner

## 2020-11-23 ENCOUNTER — Inpatient Hospital Stay: Payer: 59

## 2020-11-23 ENCOUNTER — Ambulatory Visit: Payer: 59

## 2020-11-23 VITALS — BP 117/77 | HR 76 | Temp 98.7°F | Resp 18 | Ht 67.0 in | Wt 180.7 lb

## 2020-11-23 VITALS — BP 114/76 | HR 66 | Temp 97.8°F | Resp 18 | Ht 67.0 in | Wt 181.0 lb

## 2020-11-23 DIAGNOSIS — Z79899 Other long term (current) drug therapy: Secondary | ICD-10-CM | POA: Insufficient documentation

## 2020-11-23 DIAGNOSIS — C20 Malignant neoplasm of rectum: Secondary | ICD-10-CM

## 2020-11-23 DIAGNOSIS — R918 Other nonspecific abnormal finding of lung field: Secondary | ICD-10-CM | POA: Insufficient documentation

## 2020-11-23 DIAGNOSIS — Z87891 Personal history of nicotine dependence: Secondary | ICD-10-CM

## 2020-11-23 LAB — CBC WITH DIFFERENTIAL (CANCER CENTER ONLY)
Abs Immature Granulocytes: 0.01 10*3/uL (ref 0.00–0.07)
Basophils Absolute: 0.1 10*3/uL (ref 0.0–0.1)
Basophils Relative: 1 %
Eosinophils Absolute: 0.2 10*3/uL (ref 0.0–0.5)
Eosinophils Relative: 2 %
HCT: 43.9 % (ref 39.0–52.0)
Hemoglobin: 14.8 g/dL (ref 13.0–17.0)
Immature Granulocytes: 0 %
Lymphocytes Relative: 23 %
Lymphs Abs: 1.4 10*3/uL (ref 0.7–4.0)
MCH: 29.1 pg (ref 26.0–34.0)
MCHC: 33.7 g/dL (ref 30.0–36.0)
MCV: 86.4 fL (ref 80.0–100.0)
Monocytes Absolute: 0.4 10*3/uL (ref 0.1–1.0)
Monocytes Relative: 7 %
Neutro Abs: 4.1 10*3/uL (ref 1.7–7.7)
Neutrophils Relative %: 67 %
Platelet Count: 247 10*3/uL (ref 150–400)
RBC: 5.08 MIL/uL (ref 4.22–5.81)
RDW: 12 % (ref 11.5–15.5)
WBC Count: 6.2 10*3/uL (ref 4.0–10.5)
nRBC: 0 % (ref 0.0–0.2)

## 2020-11-23 LAB — IRON AND TIBC
Iron: 83 ug/dL (ref 42–163)
Saturation Ratios: 22 % (ref 20–55)
TIBC: 376 ug/dL (ref 202–409)
UIBC: 293 ug/dL (ref 117–376)

## 2020-11-23 LAB — CMP (CANCER CENTER ONLY)
ALT: 23 U/L (ref 0–44)
AST: 16 U/L (ref 15–41)
Albumin: 4.2 g/dL (ref 3.5–5.0)
Alkaline Phosphatase: 65 U/L (ref 38–126)
Anion gap: 10 (ref 5–15)
BUN: 14 mg/dL (ref 6–20)
CO2: 26 mmol/L (ref 22–32)
Calcium: 9.4 mg/dL (ref 8.9–10.3)
Chloride: 104 mmol/L (ref 98–111)
Creatinine: 1.11 mg/dL (ref 0.61–1.24)
GFR, Estimated: >60 mL/min (ref >60)
Glucose, Bld: 98 mg/dL (ref 70–99)
Potassium: 4.4 mmol/L (ref 3.5–5.1)
Sodium: 140 mmol/L (ref 135–145)
Total Bilirubin: 0.4 mg/dL (ref 0.3–1.2)
Total Protein: 7.3 g/dL (ref 6.5–8.1)

## 2020-11-23 LAB — FERRITIN: Ferritin: 71 ng/mL (ref 24–336)

## 2020-11-23 NOTE — Addendum Note (Signed)
Encounter addended by: Cori Razor, RN on: 11/23/2020 4:17 PM  Actions taken: Flowsheet accepted, Actions taken from a BestPractice Advisory, Order list changed, Diagnosis association updated

## 2020-11-23 NOTE — Progress Notes (Signed)
The proposed treatment discussed in conference is for discussion purposes only and is not a binding recommendation.  The patients have not been physically examined, or presented with their treatment options.  Therefore, final treatment plans cannot be decided.   

## 2020-11-23 NOTE — Progress Notes (Signed)
Radiation Oncology         [phone] ________________________________  Name: Cesar Herrera        MRN: [id number]  Date of Service: 11/23/2020 DOB: 14-Oct-1970  PX:[id number], Claudina Lick, MD      REFERRING PHYSICIAN: Dr. Ardis Hughs  DIAGNOSIS: The encounter diagnosis was Rectal adenocarcinoma ([place]).   HISTORY OF PRESENT ILLNESS: Cesar Herrera is a 50 y.o. male seen at the request of Dr. Ardis Hughs for a new diagnosis of rectal cancer. The patient had noticed changes in looseness of bowel movements as well as episodes of bright red blood on toilet paper. He was seen by GI and underwent colonoscopy on 11/11/20 revealed a fungating nonobtsructing mass in the rectum about 7 cm from the anal verge that was circumferential measuring up to 3 cm. A few polyps were noted in the cecum, transverse and descending colon. Biopsies of the polyps were negative for malignancy but his rectal mass was consistent with an adenocarcinoma. He has had a CEA which was 1.4, and CT CAP on 11/17/20 showed thickening in the rectum and a left mesorectal nodes and 1.4 cm hypodense lesion in the right hepatic lobe and a L2 lucent lesioin felt to be somewhat stable to imaging from 2011. There were benign appearing nodules in the RML and RLL. He had an MRI pelvis also on 11/15/20 that staged the cancer as a cT3bN1 with tortuosity of the rectum and measured the lesion at 3.5 cm with 2-3 mm invasion beyond the rectal wall. Interestingly in GI conference this morning when his case was discussed, it was felt that the single left perirectal node may be smaller than anticipated, and possibly that the T staging could be more 3a versus 3b.  He's seen today to discuss treatment of his cancer.     PREVIOUS RADIATION THERAPY: No   PAST MEDICAL HISTORY:  Past Medical History:  Diagnosis Date  . Cancer ([place])   . H/O epididymitis 2011  . Seasonal allergies        PAST SURGICAL HISTORY: Past Surgical History:  Procedure Laterality Date  . WISDOM  TOOTH EXTRACTION       FAMILY HISTORY:  Family History  Problem Relation Age of Onset  . Diabetes Maternal Uncle   . Diabetes Father   . Colon polyps Father   . Stroke Other        PG aunts & PG uncles  . Heart disease Paternal Grandfather   . Cancer Neg Hx   . Colon cancer Neg Hx   . Stomach cancer Neg Hx   . Esophageal cancer Neg Hx      SOCIAL HISTORY:  reports that he quit smoking about 17 years ago. His smoking use included cigarettes. He has never used smokeless tobacco. He reports current alcohol use. He reports that he does not use drugs. The patient is married and lives in [place]. He is accompanied by his wife. They have teenage daughters.    ALLERGIES: Erythromycin and Penicillins   MEDICATIONS:  Current Outpatient Medications  Medication Sig Dispense Refill  . ALPRAZolam (XANAX) 0.25 MG tablet Take 1 tablet (0.25 mg total) by mouth 2 (two) times daily as needed for anxiety. 30 tablet 0  . Multiple Vitamins-Minerals (MULTIVITAMIN) tablet Take 1 tablet by mouth daily.     No current facility-administered medications for this encounter.     REVIEW OF SYSTEMS: On review of systems, the patient reports that he is doing well overall. He does have occasional blood on  toilet paper after bowel movements but has not seen blood since his colonoscopy. He does note 2-3 stools per day and that his most frequent episodes of bowel movements occur in the morning and feel incomplete initially but an hour or so later he may have another stool. He denies rectal pain or pressure, pain, nausea, vomiting, or unintended weight loss. No other complaints are verbalized.     PHYSICAL EXAM:  Wt Readings from Last 3 Encounters:  11/23/20 181 lb (82.1 kg)  11/23/20 180 lb 11.2 oz (82 kg)  11/18/20 180 lb (81.6 kg)   Temp Readings from Last 3 Encounters:  11/23/20 97.8 F (36.6 C)  11/23/20 98.7 F (37.1 C) (Tympanic)  11/18/20 98.3 F (36.8 C) (Oral)   BP Readings from Last 3  Encounters:  11/23/20 114/76  11/23/20 117/77  11/18/20 118/76   Pulse Readings from Last 3 Encounters:  11/23/20 66  11/23/20 76  11/18/20 76    In general this is a well appearing caucasian male in no acute distress. He's alert and oriented x4 and appropriate throughout the examination. Cardiopulmonary assessment is negative for acute distress and he exhibits normal effort.     ECOG = 1  0 - Asymptomatic (Fully active, able to carry on all predisease activities without restriction)  1 - Symptomatic but completely ambulatory (Restricted in physically strenuous activity but ambulatory and able to carry out work of a light or sedentary nature. For example, light housework, office work)  2 - Symptomatic, <50% in bed during the day (Ambulatory and capable of all self care but unable to carry out any work activities. Up and about more than 50% of waking hours)  3 - Symptomatic, >50% in bed, but not bedbound (Capable of only limited self-care, confined to bed or chair 50% or more of waking hours)  4 - Bedbound (Completely disabled. Cannot carry on any self-care. Totally confined to bed or chair)  5 - Death   Eustace Pen MM, Creech RH, Tormey DC, et al. [phone]). "Toxicity and response criteria of the [facility] Group". [place] Oncol. 5 (6): 649-55    LABORATORY DATA:  Lab Results  Component Value Date   WBC 6.2 11/23/2020   HGB 14.8 11/23/2020   HCT 43.9 11/23/2020   MCV 86.4 11/23/2020   PLT 247 11/23/2020   Lab Results  Component Value Date   NA 140 11/23/2020   K 4.4 11/23/2020   CL 104 11/23/2020   CO2 26 11/23/2020   Lab Results  Component Value Date   ALT 23 11/23/2020   AST 16 11/23/2020   ALKPHOS 65 11/23/2020   BILITOT 0.4 11/23/2020      RADIOGRAPHY: MR PELVIS WO CONTRAST  Result Date: 11/15/2020 CLINICAL DATA:  Colorectal cancer staging. EXAM: MRI PELVIS WITHOUT CONTRAST TECHNIQUE: Multiplanar multisequence MR imaging of the pelvis was  performed. No intravenous contrast was administered. Small amount of [place] gel was administered per rectum to optimize tumor evaluation. COMPARISON:  CT urogram from February 26, 2010, no recent imaging is available for comparison. FINDINGS: TUMOR LOCATION Tumor distance from Anal Verge/Skin Surface:  10.4 cm Tumor distance to Internal Anal Sphincter: 6.4 cm TUMOR DESCRIPTION Circumferential Extent: Eccentric favoring the LEFT rectum with polypoid extension of tumor into the lumen of the rectum. Distortion of the rectum due to tumor involvement along the LEFT lateral aspect. Tumor Length: 3.5 cm T - CATEGORY Extension through Muscularis Propria: T3b suspected on image 20 of series 2 where signal is  seen at the margin of the distorted also on image 26 of series 7, extending approximately 2-3 mm beyond the confines of the rectal wall. Shortest Distance of any tumor/node from Mesorectal Fascia: 10 mm Extramural Vascular Invasion/Tumor Thrombus: No Invasion of Anterior Peritoneal Reflection: No Involvement of Adjacent Organs or Pelvic Sidewall: No Levator Ani Involvement: No N - CATEGORY Mesorectal Lymph Nodes >=44m: Single LEFT mesorectal lymph node at 5 mm. (Image 19 of series 7) Extra-mesorectal Lymphadenopathy: No Other: Urinary Tract: Urinary bladder is grossly normal. Bowel: As above.  No visualized acute bowel process. Vascular/Lymphatic: As above. Reproductive: Heterogeneity of the prostate, nonspecific finding on study not performed for prostate evaluation. Other: No ascites Musculoskeletal: No suspicious musculoskeletal finding. IMPRESSION: Signs of potential T3 B disease, tortuosity of the rectum at the level of the tumor makes assessment difficult, distorted by the polypoid eccentric tumor in the high rectum as described. N1 disease. No extramural venous invasion. Tumor extends just to the level of the APR in this center below this level. Electronically Signed   By: GZetta BillsM.D.   On: 11/15/2020 11:24   CT  CHEST ABDOMEN PELVIS W CONTRAST  Result Date: 11/17/2020 CLINICAL DATA:  Staging of rectal cancer EXAM: CT CHEST, ABDOMEN, AND PELVIS WITH CONTRAST TECHNIQUE: Multidetector CT imaging of the chest, abdomen and pelvis was performed following the standard protocol during bolus administration of intravenous contrast. CONTRAST:  1047mOMNIPAQUE IOHEXOL 300 MG/ML  SOLN COMPARISON:  MRI pelvis 11/15/2020 FINDINGS: CT CHEST FINDINGS Cardiovascular: Unremarkable Mediastinum/Nodes: Unremarkable Lungs/Pleura: 6 by 4 by 4 mm (volume = 50 mm^3) right middle lobe nodule, image 79 series 6. 2 mm right middle lobe subpleural nodule, image 91 series 6. 2 mm right lower lobe subpleural nodule, image 89 series 6. Musculoskeletal: Mild spurring of the right and left glenoid. CT ABDOMEN PELVIS FINDINGS Hepatobiliary: 1.4 by 1.2 by 0.9 cm hypodense lesion in the right hepatic lobe on image 50 of series 2, poorly seen on the delayed images, presents on the 05/29/2010 noncontrast CT uncertain, metastatic lesion is not excluded. Gallbladder unremarkable. Pancreas: Unremarkable Spleen: Unremarkable Adrenals/Urinary Tract: Unremarkable Stomach/Bowel: Known posterior rectal wall mass, image 109 series 2, recently characterized by pelvic MRI. Vascular/Lymphatic: No pathologic adenopathy is identified. The left mesorectal 5 mm in short axis lymph node shown on MRI was better shown on the prior exam and only faintly apparent on image 106 of series 2 of today's CT. Reproductive: Unremarkable Other: No supplemental non-categorized findings. Musculoskeletal: Lucent lesion of the left posterior L2 vertebral body potentially extending slightly into the pedicle, measuring 1.9 by 1.4 cm on image 116 of series 5. This previously measured about 1.6 by 1.3 cm on 03/28/2010, accordingly making it unlikely to be a metastatic lesion. 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 IMPRESSION: 1. Known rectal  mass with a 5 mm in short axis left mesorectal lymph node better shown on the prior exam and only faintly apparent on the CT. 2. 1.4 by 1.2 by 0.9 cm hypodense lesion in the right hepatic lobe on image 50 of series 2, poorly seen on the delayed images. This lesion is indeterminate by CT and could be benign or malignant. Imaging possibilities for with further workup might include hepatic protocol MRI with and without contrast or PET-CT. 3. Lucent lesion of the left posterior L2 vertebral body potentially extending slightly into the pedicle, measuring 1.9 by 1.4 cm on image 116 of series 5.  This previously measured 1.6 by 1.3 cm on 03/28/2010, accordingly making it unlikely to be a metastatic lesion. 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 4. Small right middle lobe and right lower lobe pulmonary nodules are likely benign but merit surveillance in this context. Electronically Signed   By: Van Clines M.D.   On: 11/17/2020 10:40       IMPRESSION/PLAN: 1. Stage IIIB, cT3a-bN1Mx adenocarcinoma of the rectum. Dr. Lisbeth Renshaw discusses the pathology findings and reviews the nature of rectal carcinoma. We reviewed the discussion from GI oncology conference this morning. He has a small left perirectal node but the tumor itself did not seem to be widely compromising the muscularis so it was felt that it was between a cT3a and cT3b. Without high lymphatic burden in the pelvis, Dr. Burr Medico favored proceeding with chemoRT, surgical resection and adjuvant chemotherapy. He has also met with Dr. Leighton Ruff and she anticipates LAR with diverting ileostomy. He will also see Dr. Morton Stall at [facility] tomorrow morning for a second surgical opinion.  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 MRI of the liver has been ordered and the patient will be scheduled for this  soon.  We discussed the risks, benefits, short, and long term effects of radiotherapy, as well as the curative intent, and the patient is interested in proceeding. Dr. Lisbeth Renshaw discusses the delivery and logistics of radiotherapy and anticipates a course of 5 1/2 weeks of radiotherapy. He will simulate on Monday next week and we anticipate starting therapy on 12/05/20, and would anticipate completing radiotherapy by 01/11/21. Written consent is obtained and placed in the chart, a copy was provided to the patient.    In a visit lasting 60 minutes, greater than 50% of the time was spent face to face discussing the patient's condition, in preparation for the discussion, and coordinating the patient's care.   The above documentation reflects my direct findings during this shared patient visit. Please see the separate note by Dr. Lisbeth Renshaw on this date for the remainder of the patient's plan of care.    Carola Rhine, [facility]   **Disclaimer: This note was dictated with voice recognition software. Similar sounding words can inadvertently be transcribed and this note may contain transcription errors which may not have been corrected upon publication of note.**

## 2020-11-23 NOTE — Progress Notes (Signed)
GI Location of Tumor / Histology: Rectal Cancer  QUARON DELACRUZ noticed changes to his bowel movements.  Stool was loose and with episodes of bright red blood noted when he wipes.  MRI Pelvis 11/15/2020: Staged the cancer as a cT3bN1 with tortuosity of the rectum and measured the lesion at 3.5 cm with 2-3 mm invasion beyond the rectal wall.   CT CAP 11/17/2020: Thickening in the rectum and a left mesorectal nodes and 1.4 cm hypodense lesion in the right hepatic lobe and a L2 lucent lesioin felt to be somewhat stable to imaging from 2011. There were benign appearing nodules in the RML and RLL.  Colonoscopy 11/11/2020: Fungating non-obstructing mass in the rectum about 7 cm from the anal verge that was circumferential measuring up to 3 cm.  Biopsies of Rectal Mass and Colon 11/11/2020    Past/Anticipated interventions by surgeon, if any:  -He is seeking second surgical opinion 11/24/2020 with Dr. Morton Stall at [facility]  Past/Anticipated interventions by medical oncology, if any:  Cira Rue NP/Dr. Burr Medico 11/23/2020 11 am -He qualifies for genetics due to his young age, he is interested and agreeable.  A referral was placed today. -Patient states tentative plan is for ChemoRT followed by surgery.   Weight changes, if any: Stable  Bowel/Bladder complaints, if any: Stool is loose, no constipation noted.  No changes to bladder.  Nausea / Vomiting, if any: No  Pain issues, if any: No  Any blood per rectum: Little bit of blood noted with bowels.  Has not noticed any since his colonoscopy.  SAFETY ISSUES:  Prior radiation? No  Pacemaker/ICD? No  Possible current pregnancy? n/a  Is the patient on methotrexate? no  Current Complaints/Details:

## 2020-11-23 NOTE — Progress Notes (Signed)
Error

## 2020-11-23 NOTE — Progress Notes (Signed)
Met with patient and his wife Cesar Herrera at his initial medical oncology consult with Cira Rue NP and Dr. [name].  I explained my role as nurse navigator and they were given my direct contact information.  I gave encouraged them to call with any questions or concerns.  He plans to seek a second surgical opinion tomorrow with Dr. Morton Stall at [facility].  He has a consultation with Dr. Lisbeth Renshaw this afternoon as well.

## 2020-11-23 NOTE — Progress Notes (Signed)
[place]  [phone] Fax:([phone] [place] Note   Patient Care Team: [name], MD as PCP - General (Internal Medicine) [name], RN as Oncology Nurse Navigator [name], NP as Nurse Practitioner (Nurse Practitioner) [name], MD as Consulting Physician (Oncology) Date of Service: 11/23/2020   CHIEF COMPLAINTS/PURPOSE OF CONSULTATION:  Rectal cancer, referred by gastroenterology Dr. Owens Loffler  SUMMARY OF ONCOLOGY HISTORY  Oncology History  Rectal adenocarcinoma ([place])  11/11/2020 Procedure   Colonoscopy by Dr. Ardis Hughs Impression - Three 2 to 5 mm polyps in the descending colon, in the transverse colon and in the cecum, removed with a cold snare. Complete resection. 2/3 polyps retrieved, sent to pathology. - Rectal tumor that appears malignant, distal edge 7cm from the anal verge. This was biopsied and then labeled with Spot submucosal tattoo. - The examination was otherwise normal on direct and retroflexion views.   11/11/2020 Initial Biopsy   Diagnosis 1. Descending Colon Polyp - TUBULAR ADENOMA (3 OF 3 FRAGMENTS) - NO HIGH-GRADE DYSPLASIA OR MALIGNANCY IDENTIFIED 2. Rectum, biopsy - ADENOCARCINOMA, AT LEAST INTRAMUCOSAL   11/11/2020 Tumor Marker   CEA: 1.4 CA 19-9: 10   11/15/2020 Imaging   Pelvic MRI for local staging IMPRESSION: Signs of potential T3 B disease, tortuosity of the rectum at the level of the tumor makes assessment difficult, distorted by the polypoid eccentric tumor in the high rectum as described.   N1 disease.   No extramural venous invasion. Tumor extends just to the level of the APR in this center below this level.   11/17/2020 Imaging   CT CAP IMPRESSION: 1. Known rectal mass with a 5 mm in short axis left mesorectal lymph node better shown on the prior exam and only faintly apparent on the CT. 2. 1.4 by 1.2 by 0.9 cm hypodense lesion in the right hepatic lobe on image 50  of series 2, poorly seen on the delayed images. This lesion is indeterminate by CT and could be benign or malignant. Imaging possibilities for with further workup might include hepatic protocol MRI with and without contrast or PET-CT. 3. Lucent lesion of the left posterior L2 vertebral body potentially extending slightly into the pedicle, measuring 1.9 by 1.4 cm on image 116 of series 5. This previously measured 1.6 by 1.3 cm on 03/28/2010, accordingly making it unlikely to be a metastatic lesion. 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 4. Small right middle lobe and right lower lobe pulmonary nodules are likely benign but merit surveillance in this context.   11/18/2020 Initial Diagnosis   Rectal adenocarcinoma ([place])   11/23/2020 Cancer Staging   Staging form: Colon and Rectum, AJCC 8th Edition - Clinical stage from 11/23/2020: Stage IIIB (cT3, cN1, cM0) - Signed by [name], NP on 11/23/2020 Stage prefix: Initial diagnosis      HISTORY OF PRESENTING ILLNESS:  Cesar Herrera 50 y.o. male no significant past medical history is here because of newly diagnosed rectal cancer.  He noticed a change in bowel habits to diarrhea 4-5 months ago with occasional blood on toilet paper.  Denies unintentional weight loss, decreased appetite, significant fatigue, or pain.  Diagnostic colonoscopy with Dr. Ardis Hughs on 11/11/2020 showed 3 polyps throughout the colon and a fungating, ulcerated, nonobstructing mass in the mid rectum 7 cm from the anal verge.  The mass was partially circumferential measuring 3 cm.  Path confirmed adenocarcinoma  in the rectum, other polyps were tubular adenomas.  Baseline CEA 1.4 and CA 19-9 of 10 which are normal.  MRI of the pelvis for local staging on 11/15/2020 showed T3 lesion 10.4 cm from the anal verge/skin surface and 6.4 cm from the anal sphincter.  There is a single left mesorectal lymph node at 4.5-5 mm, concerning  for N1 disease but could also represent a reactive lymph node.  Staging CT CAP on 11/17/2020 showed few small indeterminate lung nodules, a 1.4 x 1.2 x 0.9 cm hypodense lesion in the right hepatic lobe and what is likely a hemangioma in the left posterior L2 vertebral body.  Otherwise no evidence of distant metastasis.  He was seen by Dr. Leighton Ruff, also has second opinion with Dr. Morton Stall at [facility] on 11/24/2020.  Socially, he is married with 2 daughters ages 66 and 91 who are somewhat aware of the situation.  He works in Multimedia programmer.  He is independent with all ADLs.  Has had no prior colonoscopy.  Denies family history of cancer except maybe a maternal great uncle had a GI malignancy.  He smoked cigarettes socially in high school and college.  Drinks wine or beer in moderate amounts, usually daily.  Denies other drug use.  Today he presents with his wife.  He feels mostly normal except some fatigue from the stress of his diagnosis.  He remains functional, able to work and would like to work as much through treatment as possible.  He has diarrhea in the morning.  No rectal pain.  He has some bleeding with wiping.  MEDICAL HISTORY:  Past Medical History:  Diagnosis Date  . Cancer ([place])   . H/O epididymitis 2011  . Seasonal allergies     SURGICAL HISTORY: Past Surgical History:  Procedure Laterality Date  . WISDOM TOOTH EXTRACTION      SOCIAL HISTORY: Social History   Socioeconomic History  . Marital status: Married    Spouse name: Not on file  . Number of children: Not on file  . Years of education: Not on file  . Highest education level: Not on file  Occupational History  . Not on file  Tobacco Use  . Smoking status: Former Smoker    Types: Cigarettes    Quit date: 07/29/2003    Years since quitting: 17.3  . Smokeless tobacco: Never Used  . Tobacco comment: smoked 1993-2004, up to < 4 cigarettes/ day (socially)  Vaping Use  . Vaping Use: Never used  Substance  and Sexual Activity  . Alcohol use: Yes    Comment: 1-2 drinks , wine or beer  . Drug use: No  . Sexual activity: Yes  Other Topics Concern  . Not on file  Social History Narrative   Exercise: mountain biking, hiking, walking - tends to exercise in spurts, does some yoga   Social Determinants of Health   Financial Resource Strain: Not on file  Food Insecurity: Not on file  Transportation Needs: Not on file  Physical Activity: Not on file  Stress: Not on file  Social Connections: Not on file  Intimate Partner Violence: Not on file    FAMILY HISTORY: Family History  Problem Relation Age of Onset  . Diabetes Maternal Uncle   . Diabetes Father   . Colon polyps Father   . Stroke Other        PG aunts & PG uncles  . Heart disease Paternal Grandfather   . Cancer Neg Hx   .  Colon cancer Neg Hx   . Stomach cancer Neg Hx   . Esophageal cancer Neg Hx     ALLERGIES:  is allergic to erythromycin and penicillins.  MEDICATIONS:  Current Outpatient Medications  Medication Sig Dispense Refill  . ALPRAZolam (XANAX) 0.25 MG tablet Take 1 tablet (0.25 mg total) by mouth 2 (two) times daily as needed for anxiety. 30 tablet 0  . Multiple Vitamins-Minerals (MULTIVITAMIN) tablet Take 1 tablet by mouth daily.     No current facility-administered medications for this visit.    REVIEW OF SYSTEMS:   Constitutional: Denies unintentional weight loss, fevers, chills or abnormal night sweats (+) stress induced fatigue Eyes: Denies blurriness of vision, double vision or watery eyes Ears, nose, mouth, throat, and face: Denies mucositis or sore throat Respiratory: Denies cough, dyspnea or wheezes Cardiovascular: Denies palpitation, chest discomfort or lower extremity swelling Gastrointestinal:  Denies nausea, vomiting, constipation, heartburn (+) change in bowel habits (+) diarrhea (+) blood with wiping Skin: Denies abnormal skin rashes Lymphatics: Denies new lymphadenopathy or easy  bruising Neurological:Denies numbness, tingling or new weaknesses Behavioral/Psych: Mood is stable, no new changes  All other systems were reviewed with the patient and are negative.  PHYSICAL EXAMINATION: ECOG PERFORMANCE STATUS: 0 - Asymptomatic  Vitals:   11/23/20 1108  BP: 117/77  Pulse: 76  Resp: 18  Temp: 98.7 F (37.1 C)  SpO2: 100%   Filed Weights   11/23/20 1108  Weight: 180 lb 11.2 oz (82 kg)    GENERAL:alert, no distress and comfortable SKIN: No rash EYES:  sclera clear LYMPH:  no palpable cervical or supraclavicular lymphadenopathy  LUNGS: clear with normal breathing effort HEART: regular rate & rhythm, no lower extremity edema ABDOMEN:abdomen soft, non-tender and normal bowel sounds.  No hepatomegaly or mass Musculoskeletal:no cyanosis of digits and no clubbing  PSYCH: alert & oriented x 3 with fluent speech NEURO: no focal motor/sensory deficits RECTAL exam deferred   LABORATORY DATA:  I have reviewed the data as listed CBC Latest Ref Rng & Units 11/23/2020 09/09/2019 07/28/2013  WBC 4.0 - 10.5 K/uL 6.2 5.3 4.9  Hemoglobin 13.0 - 17.0 g/dL 14.8 14.7 14.7  Hematocrit 39.0 - 52.0 % 43.9 43.3 43.1  Platelets 150 - 400 K/uL 247 208.0 194.0   CMP Latest Ref Rng & Units 11/23/2020 09/09/2019 07/28/2013  Glucose 70 - 99 mg/dL 98 109(H) 101(H)  BUN 6 - 20 mg/dL _0 Creatinine 0.61 - 1.24 mg/dL 1.11 1.05 1.1  Sodium 135 - 145 mmol/L 140 137 137  Potassium 3.5 - 5.1 mmol/L 4.4 4.1 3.8  Chloride 98 - 111 mmol/L 104 103 103  CO2 22 - 32 mmol/L _1 Calcium 8.9 - 10.3 mg/dL 9.4 9.6 9.3  Total Protein 6.5 - 8.1 g/dL 7.3 7.2 7.4  Total Bilirubin 0.3 - 1.2 mg/dL 0.4 0.5 0.6  Alkaline Phos 38 - 126 U/L 65 59 60  AST 15 - 41 U/L _2 ALT 0 - 44 U/L _3 RADIOGRAPHIC STUDIES: I have personally reviewed the radiological images as listed and agreed with the findings in the report. MR PELVIS WO CONTRAST  Result Date: 11/15/2020 CLINICAL DATA:   Colorectal cancer staging. EXAM: MRI PELVIS WITHOUT CONTRAST TECHNIQUE: Multiplanar multisequence MR imaging of the pelvis was performed. No intravenous contrast was administered. Small amount of [place] gel was administered per rectum to optimize tumor evaluation. COMPARISON:  CT urogram from February 26, 2010, no recent imaging is  available for comparison. FINDINGS: TUMOR LOCATION Tumor distance from Anal Verge/Skin Surface:  10.4 cm Tumor distance to Internal Anal Sphincter: 6.4 cm TUMOR DESCRIPTION Circumferential Extent: Eccentric favoring the LEFT rectum with polypoid extension of tumor into the lumen of the rectum. Distortion of the rectum due to tumor involvement along the LEFT lateral aspect. Tumor Length: 3.5 cm T - CATEGORY Extension through Muscularis Propria: T3b suspected on image 20 of series 2 where signal is seen at the margin of the distorted also on image 26 of series 7, extending approximately 2-3 mm beyond the confines of the rectal wall. Shortest Distance of any tumor/node from Mesorectal Fascia: 10 mm Extramural Vascular Invasion/Tumor Thrombus: No Invasion of Anterior Peritoneal Reflection: No Involvement of Adjacent Organs or Pelvic Sidewall: No Levator Ani Involvement: No N - CATEGORY Mesorectal Lymph Nodes >=49m: Single LEFT mesorectal lymph node at 5 mm. (Image 19 of series 7) Extra-mesorectal Lymphadenopathy: No Other: Urinary Tract: Urinary bladder is grossly normal. Bowel: As above.  No visualized acute bowel process. Vascular/Lymphatic: As above. Reproductive: Heterogeneity of the prostate, nonspecific finding on study not performed for prostate evaluation. Other: No ascites Musculoskeletal: No suspicious musculoskeletal finding. IMPRESSION: Signs of potential T3 B disease, tortuosity of the rectum at the level of the tumor makes assessment difficult, distorted by the polypoid eccentric tumor in the high rectum as described. N1 disease. No extramural venous invasion. Tumor extends just to the  level of the APR in this center below this level. Electronically Signed   By: GZetta BillsM.D.   On: 11/15/2020 11:24   CT CHEST ABDOMEN PELVIS W CONTRAST  Result Date: 11/17/2020 CLINICAL DATA:  Staging of rectal cancer EXAM: CT CHEST, ABDOMEN, AND PELVIS WITH CONTRAST TECHNIQUE: Multidetector CT imaging of the chest, abdomen and pelvis was performed following the standard protocol during bolus administration of intravenous contrast. CONTRAST:  1083mOMNIPAQUE IOHEXOL 300 MG/ML  SOLN COMPARISON:  MRI pelvis 11/15/2020 FINDINGS: CT CHEST FINDINGS Cardiovascular: Unremarkable Mediastinum/Nodes: Unremarkable Lungs/Pleura: 6 by 4 by 4 mm (volume = 50 mm^3) right middle lobe nodule, image 79 series 6. 2 mm right middle lobe subpleural nodule, image 91 series 6. 2 mm right lower lobe subpleural nodule, image 89 series 6. Musculoskeletal: Mild spurring of the right and left glenoid. CT ABDOMEN PELVIS FINDINGS Hepatobiliary: 1.4 by 1.2 by 0.9 cm hypodense lesion in the right hepatic lobe on image 50 of series 2, poorly seen on the delayed images, presents on the 05/29/2010 noncontrast CT uncertain, metastatic lesion is not excluded. Gallbladder unremarkable. Pancreas: Unremarkable Spleen: Unremarkable Adrenals/Urinary Tract: Unremarkable Stomach/Bowel: Known posterior rectal wall mass, image 109 series 2, recently characterized by pelvic MRI. Vascular/Lymphatic: No pathologic adenopathy is identified. The left mesorectal 5 mm in short axis lymph node shown on MRI was better shown on the prior exam and only faintly apparent on image 106 of series 2 of today's CT. Reproductive: Unremarkable Other: No supplemental non-categorized findings. Musculoskeletal: Lucent lesion of the left posterior L2 vertebral body potentially extending slightly into the pedicle, measuring 1.9 by 1.4 cm on image 116 of series 5. This previously measured about 1.6 by 1.3 cm on 03/28/2010, accordingly making it unlikely to be a metastatic  lesion. 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 IMPRESSION: 1. Known rectal mass with a 5 mm in short axis left mesorectal lymph node better shown on the prior exam and only faintly apparent on the CT. 2. 1.4 by 1.2  by 0.9 cm hypodense lesion in the right hepatic lobe on image 50 of series 2, poorly seen on the delayed images. This lesion is indeterminate by CT and could be benign or malignant. Imaging possibilities for with further workup might include hepatic protocol MRI with and without contrast or PET-CT. 3. Lucent lesion of the left posterior L2 vertebral body potentially extending slightly into the pedicle, measuring 1.9 by 1.4 cm on image 116 of series 5. This previously measured 1.6 by 1.3 cm on 03/28/2010, accordingly making it unlikely to be a metastatic lesion. 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 4. Small right middle lobe and right lower lobe pulmonary nodules are likely benign but merit surveillance in this context. Electronically Signed   By: Van Clines M.D.   On: 11/17/2020 10:40    ASSESSMENT & PLAN: 50 year old male with no significant PMH  1.  Adenocarcinoma of the rectum, cT3N0/N1, stage II or III -We reviewed his medical record and work-up in detail.  He presented with change in bowel habits to diarrhea, colonoscopy showed upper/mid rectal adenocarcinoma.  -Staging work-up shows 1.4 hypodense lesion in the right lobe, otherwise no evidence of distant metastatic disease.  We are referring him for liver MRI protocol for further evaluation. -Local staging pelvic MRI shows T3N1 disease, however the node is a single 5 mm mesorectal lymph node and could be reactive, could be N0.  Overall this is clinical stage II-III rectal cancer -if liver MRI is negative for metastatic disease, Dr. Burr Medico recommends standard treatment with neoadjuvant chemo/RT with Xeloda followed by  curative surgery, then 4 months adjuvant chemo based on final surgical path.  If he has significant residual disease or node positive will recommend Capox/FOLFOX.  -He has seen local surgeon Dr. Leighton Ruff and has second opinion with Dr. Morton Stall at [facility] this week, and has an appointment with radiation oncologist Dr. Lisbeth Renshaw as well. --Chemotherapy consent - Xeloda: Side effects including but not limited to fatigue, nausea/vomiting, diarrhea, skin toxicity including hand/foot syndrome, potential kidney and liver dysfunction, and hematologic toxicities were discussed with patient in great detail. He agrees to proceed. Goal of neoadjuvant chemo is curative. -We also discussed CRC surveillance including a colonoscopy at 1 year from surgery, lab and physical exam office visits every 3 months for the first 3 years then every 6 to 12 months for total 5-year, and annual imaging x3. -We will call him with the results of the liver MRI.   -Baseline labs today including CEA, iron/TIBC, ferritin, CBC, and CMP are all normal  -we will see him back on the first day of chemo RT.  Patient and his spouse agree with the plan.  2. Genetics  -He qualifies for genetics due to his young age, he is interested and agreeable.  A referral was placed today. -His daughters are ages 69 and 33.  We briefly discussed initiating their screenings when they reached the age of 78 which is 10 years prior to his diagnosis -If he is found to have genetic mutation, we would recommend for his children (and ? Siblings) to get tested as appropriate   PLAN: -Work up reviewed -Labs today normal -Proceed with liver MRI, will call with results -Dr. Lisbeth Renshaw consult 3/23 and SIM 3/28, Dr. Morton Stall consult at [facility] 3/24 -F/up first day of chemoRT   Orders Placed This Encounter  Procedures  . MR LIVER W WO CONTRAST    Standing  Status:   Future    Standing Expiration Date:   11/23/2021    Order Specific Question:   If indicated for the  ordered procedure, I authorize the administration of contrast media per Radiology protocol    Answer:   Yes    Order Specific Question:   What is the patient's sedation requirement?    Answer:   No Sedation    Order Specific Question:   Does the patient have a pacemaker or implanted devices?    Answer:   No    Order Specific Question:   Preferred imaging location?    Answer:   [facility] (table limit - 550 lbs)  . CBC with Differential (Cancer Center Only)    Standing Status:   Standing    Number of Occurrences:   20    Standing Expiration Date:   11/23/2021  . CMP ([place] only)    Standing Status:   Standing    Number of Occurrences:   20    Standing Expiration Date:   11/23/2021  . Iron and TIBC    Standing Status:   Standing    Number of Occurrences:   20    Standing Expiration Date:   11/23/2021  . Ferritin    Standing Status:   Standing    Number of Occurrences:   20    Standing Expiration Date:   11/23/2021  . Ambulatory referral to Genetics    Referral Priority:   Routine    Referral Type:   Consultation    Referral Reason:   Specialty Services Required    Number of Visits Requested:   1    All questions were answered. The patient knows to call the clinic with any problems, questions or concerns.     [name], NP 11/23/2020   Addendum  I have seen the patient, examined him. I agree with the assessment and and plan and have edited the notes.   50 yo male wo significant PMH who presented with intermittent diarrhea and work-up reviewed mid/upper rectal adenocarcinoma.  I personally reviewed his staging CT and MRI images, and presented his case in our GI tumor board this morning.  He has T3N1 disease by MRI, but the only slightly enlarge one node is just about 69m, so not definitive, could be N0.  He has a 1.4 cm hypodense lesion in the right hepatic lobe on CT scan, likely benign, will get a liver MRI for further evaluation.  I discussed the above with  patient, and recommend neoadjuvant chemoRT with Xeloda followed by surgery and 4 months adjuvant chemo (Xeloda if node negative or CAPOX/FOLFOX if node positive on surgical path) if liver MRI negative for metastasis.  We discussed the side effect of Xeloda in great detail, he agrees to proceed.  We also discussed cancer surveillance after he completes treatment.  All questions were answered.  He is scheduled to see Dr. MLisbeth Renshawthis afternoon, and [facility].  If he needs temporary colostomy, he will have reversal a few months after he completes adjuvant chemo.   Will request MMR on his biopsy, and send for genetic referral due to his young age.   YTruitt Merle 11/23/2020

## 2020-11-24 ENCOUNTER — Ambulatory Visit: Payer: 59 | Admitting: Nurse Practitioner

## 2020-11-24 ENCOUNTER — Encounter: Payer: Self-pay | Admitting: Nurse Practitioner

## 2020-11-24 ENCOUNTER — Encounter: Payer: Self-pay | Admitting: General Practice

## 2020-11-24 ENCOUNTER — Telehealth: Payer: Self-pay | Admitting: Nurse Practitioner

## 2020-11-24 NOTE — Progress Notes (Signed)
[place] Psychosocial Distress Screening Clinical Social Work  Clinical Social Work was referred by distress screening protocol.  The patient scored a 5 on the Psychosocial Distress Thermometer which indicates moderate distress. Clinical Social Worker contacted patient by phone to assess for distress and other psychosocial needs. Unable to reach, left generic VM w my contact information and general information on support resources.  ONCBCN DISTRESS SCREENING 11/23/2020  Screening Type Initial Screening  Distress experienced in past week (1-10) 5  Practical problem type Work/school  Emotional problem type Nervousness/Anxiety    Clinical Social Worker follow up needed: Yes.  call again  If yes, follow up plan:  Beverely Pace, [place], LCSW Clinical Social Worker Phone:  [phone]

## 2020-11-24 NOTE — Telephone Encounter (Signed)
Checked out appointment. No LOS notes needing to be scheduled. No changes made. 

## 2020-11-25 ENCOUNTER — Encounter: Payer: Self-pay | Admitting: General Practice

## 2020-11-25 NOTE — Progress Notes (Signed)
[place] Psychosocial Distress Screening Clinical Social Work  Clinical Social Work was referred by distress screening protocol.  The patient scored a 5 on the Psychosocial Distress Thermometer which indicates moderate distress. Clinical Social Worker contacted patient by phone to assess for distress and other psychosocial needs. Reached patient by phone, he is at work.  Briefly reviewed resources available at [employer], emailed him information on our services and [employer].  He will review these with his wife and contact us as needed throughout treatment.    ONCBCN DISTRESS SCREENING 11/23/2020  Screening Type Initial Screening  Distress experienced in past week (1-10) 5  Practical problem type Work/school  Emotional problem type Nervousness/Anxiety    Clinical Social Worker follow up needed: No.  He will contact us as needed.    If yes, follow up plan:  Beverely Pace, [place], LCSW Clinical Social Worker Phone:  [phone]

## 2020-11-28 ENCOUNTER — Telehealth: Payer: Self-pay

## 2020-11-28 ENCOUNTER — Ambulatory Visit
Admission: RE | Admit: 2020-11-28 | Discharge: 2020-11-28 | Disposition: A | Discharge status: Home / Self Care | Payer: 59 | Source: Ambulatory Visit | Attending: Radiation Oncology | Admitting: Radiation Oncology

## 2020-11-28 ENCOUNTER — Other Ambulatory Visit: Payer: Self-pay

## 2020-11-28 ENCOUNTER — Other Ambulatory Visit: Payer: Self-pay | Admitting: Hematology

## 2020-11-28 ENCOUNTER — Telehealth: Payer: Self-pay | Admitting: Pharmacist

## 2020-11-28 DIAGNOSIS — C20 Malignant neoplasm of rectum: Secondary | ICD-10-CM

## 2020-11-28 MED ORDER — CAPECITABINE 500 MG PO TABS: ORAL_TABLET | ORAL | 1 refills | Status: DC

## 2020-11-28 NOTE — Telephone Encounter (Signed)
Oral Oncology Patient Advocate Encounter  Received notification from [place] that prior authorization for Xeloda is required.  PA submitted on CoverMyMeds Key [id number] Status is pending  Oral Oncology Clinic will continue to follow.  [place] Patient [place] Phone [phone] Fax [phone] 11/28/2020 2:36 PM

## 2020-11-28 NOTE — Telephone Encounter (Signed)
Oral Oncology Pharmacist Encounter  Received new prescription for Xeloda (capecitabine) for the treatment of stage II/III rectal adenocarcinoma in conjunction with RT, planned duration ~6 weeks (28 fractions planned).  Labs from 11/23/2020 assessed, okay to initiate treatment. Prescription dose and frequency assessed.   Current medication list in Epic reviewed, potential DDIs with multivitamin identified pending if folic acid is an ingredient.   Evaluated chart and no patient barriers to medication adherence identified.   Patient agreed to treatment on 11/28/20 per MD documentation. Start date 12/05/2020 with RT.   Prescription has been e-scribed to the [facility] for benefits analysis and approval.  Oral Oncology Clinic will continue to follow for insurance authorization, copayment issues, initial counseling and start date.  Eddie Candle, PharmD, BCPS PGY2 Hematology/Oncology Pharmacy Resident Oral Chemotherapy Navigation Clinic 11/28/2020 2:54 PM

## 2020-11-30 ENCOUNTER — Telehealth: Payer: Self-pay | Admitting: Hematology

## 2020-11-30 NOTE — Telephone Encounter (Signed)
Scheduled appts per 3/30 sch msg. Pt aware. Will have updated calendar printed at next visit.

## 2020-12-01 ENCOUNTER — Telehealth: Payer: Self-pay

## 2020-12-01 MED FILL — CAPECITABINE 500 MG TABLET: 500 | 21 days supply | Qty: 105 | Fill #0

## 2020-12-01 NOTE — Telephone Encounter (Signed)
Called patient back to let him know that Dr. Burr Medico thinks it is most likely the rectal mass pressing on nerves but when she sees him on Monday 4/4 she will examine him.  He verbalized an understanding.

## 2020-12-01 NOTE — Telephone Encounter (Signed)
Patient calls stating he is experiencing tenderness in his crotch area below his testicles.  He had mentioned this to Dr. Ardis Hughs and he thinks it is probably the rectal mass pressing on some nerves.  He wanted Dr. Ernestina Penna opinion.

## 2020-12-01 NOTE — Telephone Encounter (Signed)
Oral Chemotherapy Pharmacist Encounter  I spoke with patient for overview of: Xeloda for the treatment of rectal cancer in conjunction with radiation, planned duration ~ 5 1/2.   Counseled patient on administration, dosing, side effects, monitoring, drug-food interactions, safe handling, storage, and disposal.  Patient will take Xeloda 500mg  tablets, 4 tablets (2000mg ) by mouth in AM and 3 tabs (1500mg ) by mouth in PM, within 30 minutes of finishing meals, on days of radiation only.  Xeloda and radiation start date: 12/05/20  Adverse effects of Xeloda include but are not limited to: fatigue, decreased blood counts, GI upset, diarrhea, mouth sores, and hand-foot syndrome.  Patient has anti-emetic on hand and knows to take it if nausea develops.   Patient will obtain anti diarrheal and alert the office of 4 or more loose stools above baseline.   Reviewed with patient importance of keeping a medication schedule and plan for any missed doses. No barriers to medication adherence identified.  Medication reconciliation performed and medication/allergy list updated.  Insurance authorization for Xeloda has been obtained. Test claim at the pharmacy revealed copayment $0 for 1st fill of Xeloda. Patient will pick this up from the [facility] on 12/02/20. Patient knows not to start medication until 12/05/20 AM.  Patient informed the pharmacy will reach out 5-7 days prior to needing next fill of Xeloda to coordinate continued medication acquisition to prevent break in therapy.  All questions answered.  Cesar Herrera voiced understanding and appreciation.   Medication education handout placed in mail for patient. Patient knows to call the office with questions or concerns. Oral Chemotherapy Clinic phone number provided to patient.   Leron Croak, PharmD, BCPS Hematology/Oncology Clinical Pharmacist [facility] [phone] 12/01/2020 9:31  AM

## 2020-12-02 ENCOUNTER — Other Ambulatory Visit: Payer: Self-pay

## 2020-12-02 ENCOUNTER — Ambulatory Visit (HOSPITAL_COMMUNITY)
Admission: RE | Admit: 2020-12-02 | Discharge: 2020-12-02 | Disposition: A | Discharge status: Home / Self Care | Payer: 59 | Source: Ambulatory Visit | Attending: Nurse Practitioner | Admitting: Nurse Practitioner

## 2020-12-02 DIAGNOSIS — R1084 Generalized abdominal pain: Secondary | ICD-10-CM | POA: Diagnosis present

## 2020-12-02 DIAGNOSIS — Z8052 Family history of malignant neoplasm of bladder: Secondary | ICD-10-CM | POA: Insufficient documentation

## 2020-12-02 DIAGNOSIS — C20 Malignant neoplasm of rectum: Secondary | ICD-10-CM | POA: Insufficient documentation

## 2020-12-02 DIAGNOSIS — Z807 Family history of other malignant neoplasms of lymphoid, hematopoietic and related tissues: Secondary | ICD-10-CM | POA: Insufficient documentation

## 2020-12-02 IMAGING — MR MR ABDOMEN WO/W CM
20 series · 48 of 48 positions shown · IV contrast (gadavist)
Comparison: CT on [DATE]

CLINICAL DATA: Rectal carcinoma. Indeterminate liver lesion on
recent CT.

EXAM:
MRI ABDOMEN WITHOUT AND WITH CONTRAST
TECHNIQUE: Multiplanar multisequence MR imaging of the abdomen was performed
both before and after the administration of intravenous contrast.
CONTRAST:  8mL GADAVIST GADOBUTROL 1 MMOL/ML IV SOLN

[Series 3: T2 fat-sat · axial · 6.0mm · 1.25mm/px · 1 of 36 slices shown]
[im 1/36]
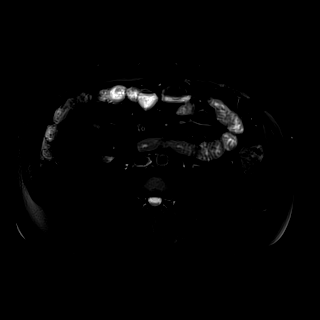

[Series 5: DWI · axial · 6.0mm · 1.57mm/px · 1 of 80 slices shown (1 of 2)]
[im 1/80]
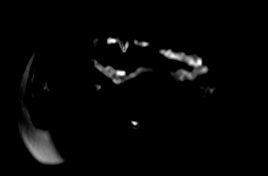

[Series 6: DWI · axial · 6.0mm · 1.57mm/px · 1 of 40 slices shown (2 of 2)]
[im 1/40]
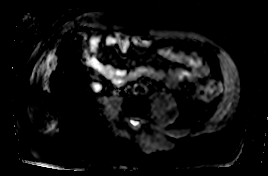

[Series 7: cor haste · coronal · 6.0mm · 1.56mm/px · 1 of 38 slices shown]
[im 1/38]
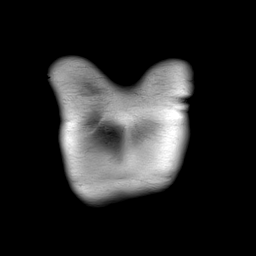

[Series 8: bSSFP · axial · 6.0mm · 2.19mm/px · 1 of 41 slices shown]
[im 1/41]
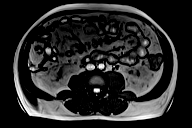

[Series 9: T1 dynamic · axial · 3.0mm · 1.31mm/px · z∈[-181,+104]mm · 3 of 96 slices shown (1 of 10)]
[im 1/96]
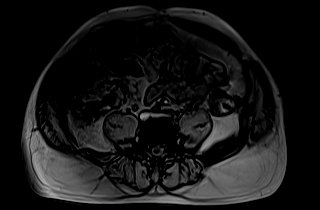
[im 48/96]
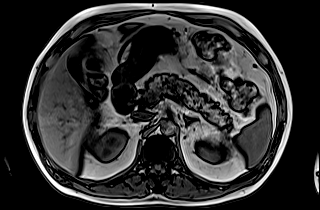
[im 96/96]
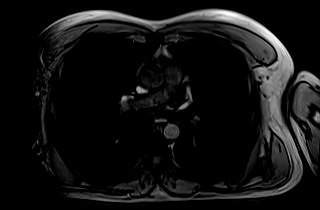

[Series 10: T1 dynamic · axial · 3.0mm · 1.31mm/px · z∈[-181,+104]mm · 3 of 96 slices shown (2 of 10)]
[im 1/96]
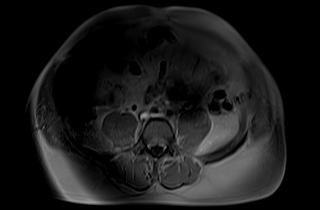
[im 48/96]
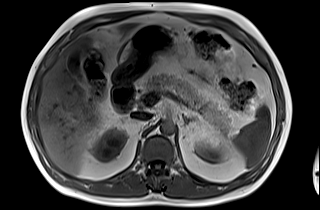
[im 96/96]
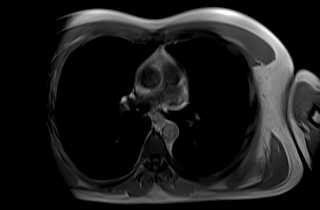

[Series 12: T1 dynamic · axial · 3.0mm · 1.31mm/px · z∈[-181,+104]mm · 3 of 96 slices shown (3 of 10)]
[im 1/96]
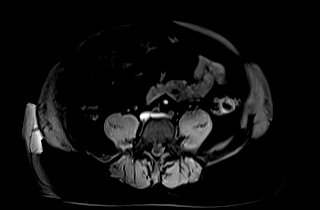
[im 48/96]
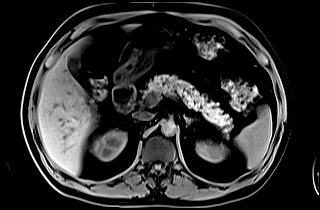
[im 96/96]
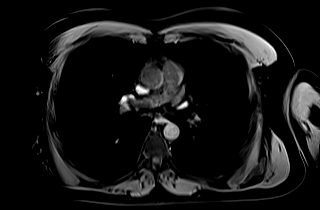

[Series 17: T1 dynamic · axial · 3.0mm · 1.31mm/px · z∈[-181,+104]mm · 3 of 96 slices shown (4 of 10)]
[im 1/96]
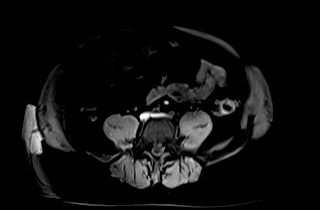
[im 48/96]
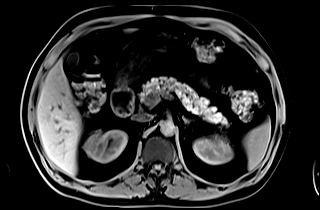
[im 96/96]
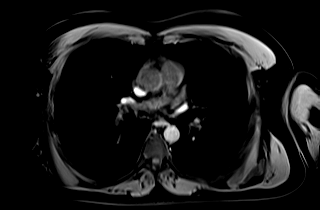

[Series 19: T1 dynamic · axial · 3.0mm · 1.31mm/px · z∈[-181,+104]mm · 3 of 96 slices shown (5 of 10)]
[im 1/96]
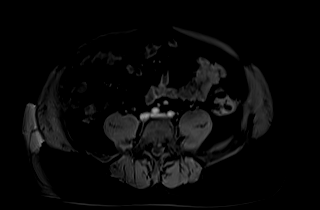
[im 48/96]
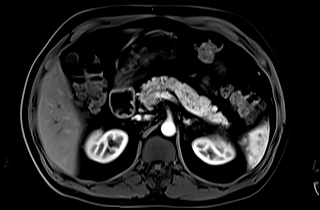
[im 96/96]
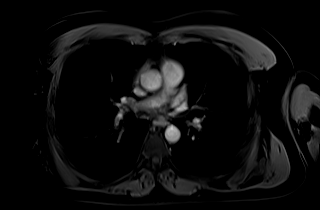

[Series 20: T1 dynamic · axial · 3.0mm · 1.31mm/px · z∈[-181,+104]mm · 3 of 96 slices shown (6 of 10)]
[im 1/96]
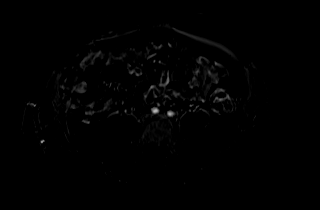
[im 48/96]
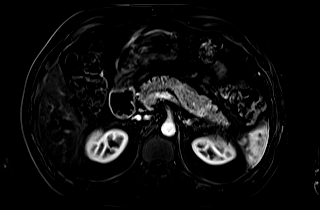
[im 96/96]
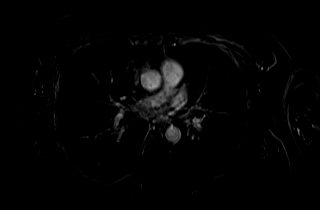

[Series 22: T1 dynamic · axial · 3.0mm · 1.31mm/px · z∈[-181,+104]mm · 3 of 96 slices shown (7 of 10)]
[im 1/96]
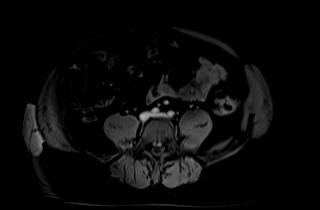
[im 48/96]
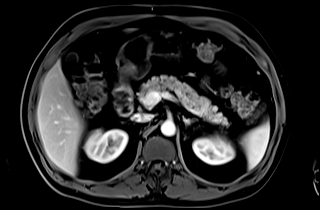
[im 96/96]
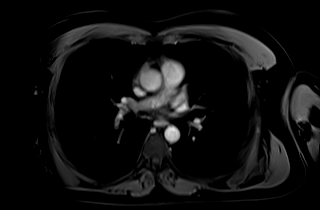

[Series 23: T1 dynamic · axial · 3.0mm · 1.31mm/px · z∈[-181,+104]mm · 3 of 96 slices shown (8 of 10)]
[im 1/96]
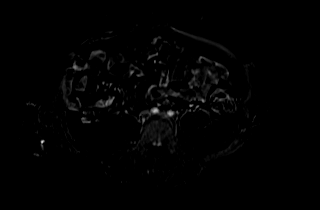
[im 48/96]
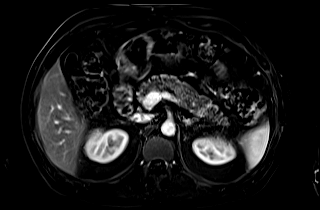
[im 96/96]
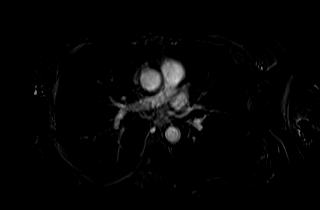

[Series 25: T1 dynamic · axial · 3.0mm · 1.31mm/px · z∈[-181,+104]mm · 3 of 96 slices shown (9 of 10)]
[im 1/96]
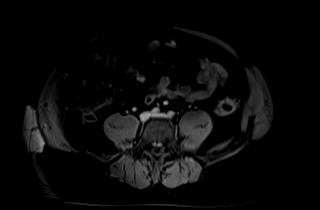
[im 48/96]
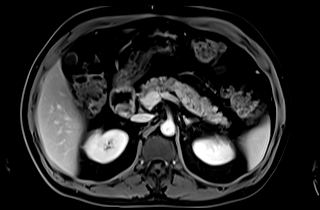
[im 96/96]
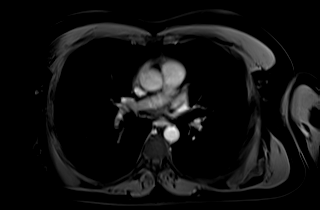

[Series 26: T1 dynamic · axial · 3.0mm · 1.31mm/px · z∈[-181,+104]mm · 3 of 96 slices shown (10 of 10)]
[im 1/96]
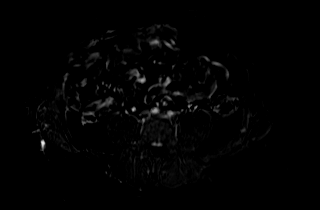
[im 48/96]
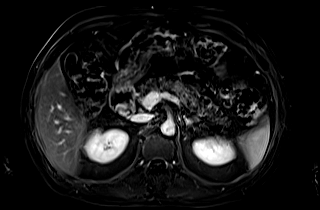
[im 96/96]
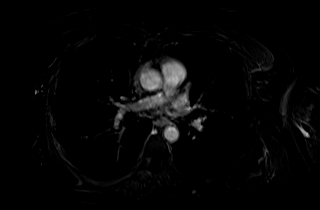

[Series 27: ax_haste_mbh · axial · 6.0mm · 1.31mm/px · 1 of 39 slices shown]
[im 1/39]
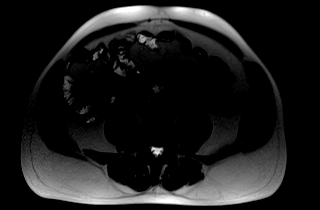

[Series 29: cor_vibe_dixon_delayed_w · coronal · 3.0mm · 2.01mm/px · 3 of 88 slices shown]
[im 1/88]
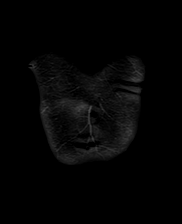
[im 44/88]
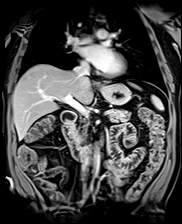
[im 88/88]
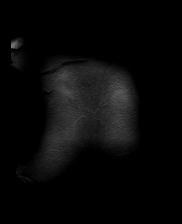

[Series 30: ax_dixon_delayed_f_reg · axial · 3.0mm · 1.31mm/px · z∈[-181,+104]mm · 3 of 96 slices shown]
[im 1/96]
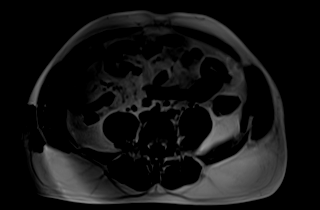
[im 48/96]
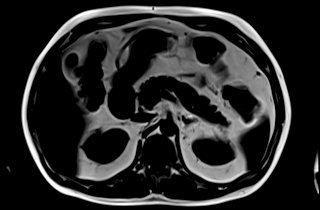
[im 96/96]
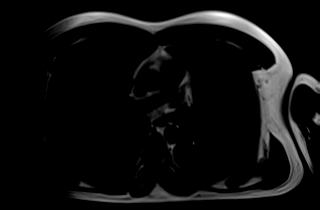

[Series 31: ax_dixon_delayed_w_reg · axial · 3.0mm · 1.31mm/px · z∈[-181,+104]mm · 3 of 96 slices shown]
[im 1/96]
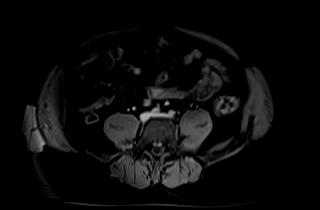
[im 48/96]
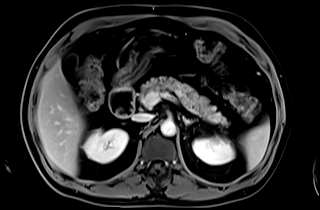
[im 96/96]
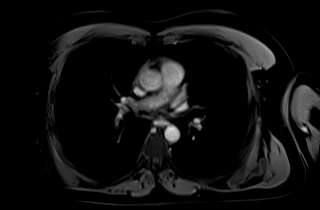

[Series 32: ax_dixon_delayed_w_reg_sub · axial · 3.0mm · 1.31mm/px · z∈[-181,+104]mm · 3 of 96 slices shown]
[im 1/96]
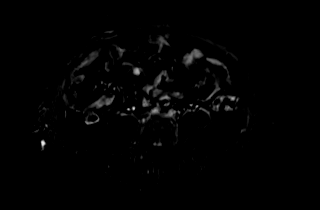
[im 48/96]
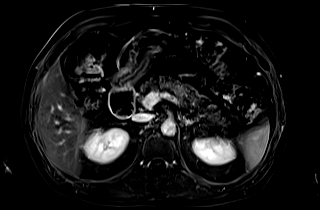
[im 96/96]
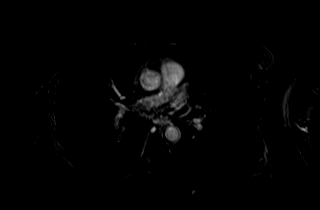

[48 of 48 positions shown; findings below may reference images not displayed]

FINDINGS: Lower chest: No acute findings.

Hepatobiliary: 8 mm T2 hyperintense lesion is seen in the right
hepatic lobe, which corresponds to the lesion seen on recent CT.
This shows nodular peripheral contrast enhancement on dynamic
imaging, consistent with a benign hemangioma. No other hepatic
masses are identified. Gallbladder is unremarkable. No evidence of
biliary ductal dilatation.

Pancreas:  No mass or inflammatory changes.

Spleen:  Within normal limits in size and appearance.

Adrenals/Urinary Tract: No masses identified. No evidence of
hydronephrosis.

Stomach/Bowel: Visualized portion unremarkable.

Vascular/Lymphatic: No pathologically enlarged lymph nodes
identified. No abdominal aortic aneurysm.

Other:  None.

Musculoskeletal:  No suspicious bone lesions identified.
IMPRESSION: Tiny benign hemangioma in the right hepatic lobe, which corresponds
to the lesion seen on recent CT. No evidence of metastatic disease
within the liver or abdomen.

## 2020-12-02 MED ORDER — GADOBUTROL 1 MMOL/ML IV SOLN
8.0000 mL | Freq: Once | INTRAVENOUS | Status: AC | PRN
  Administered 2020-12-02: 8 mL via INTRAVENOUS

## 2020-12-02 NOTE — Progress Notes (Signed)
[place]   [phone] [phone]   Clinic Follow up Note   Patient Care Team: [name], MD as PCP - General (Internal Medicine) [name], RN as Oncology Nurse Navigator [name], NP as Nurse Practitioner (Nurse Practitioner) [name], MD as Consulting Physician (Oncology)  Date of Service:  12/05/2020  CHIEF COMPLAINT: F/u of rectal cancer   SUMMARY OF ONCOLOGIC HISTORY: Oncology History Overview Note  Cancer Staging Rectal adenocarcinoma [facility]) Staging form: Colon and Rectum, AJCC 8th Edition - Clinical stage from 11/23/2020: Stage IIIB (cT3, cN1, cM0) - Signed by [name], NP on 11/23/2020 Stage prefix: Initial diagnosis    Rectal adenocarcinoma ([place])  11/11/2020 Procedure   Colonoscopy by Dr. Ardis Hughs Impression - Three 2 to 5 mm polyps in the descending colon, in the transverse colon and in the cecum, removed with a cold snare. Complete resection. 2/3 polyps retrieved, sent to pathology. - Rectal tumor that appears malignant, distal edge 7cm from the anal verge. This was biopsied and then labeled with Spot submucosal tattoo. - The examination was otherwise normal on direct and retroflexion views.   11/11/2020 Initial Biopsy   Diagnosis 1. Descending Colon Polyp - TUBULAR ADENOMA (3 OF 3 FRAGMENTS) - NO HIGH-GRADE DYSPLASIA OR MALIGNANCY IDENTIFIED 2. Rectum, biopsy - ADENOCARCINOMA, AT LEAST INTRAMUCOSAL   11/11/2020 Tumor Marker   CEA: 1.4 CA 19-9: 10   11/15/2020 Imaging   Pelvic MRI for local staging IMPRESSION: Signs of potential T3 B disease, tortuosity of the rectum at the level of the tumor makes assessment difficult, distorted by the polypoid eccentric tumor in the high rectum as described.   N1 disease.   No extramural venous invasion. Tumor extends just to the level of the APR in this center below this level.   11/17/2020 Imaging   CT CAP IMPRESSION: 1. Known rectal mass with a 5 mm in  short axis left mesorectal lymph node better shown on the prior exam and only faintly apparent on the CT. 2. 1.4 by 1.2 by 0.9 cm hypodense lesion in the right hepatic lobe on image 50 of series 2, poorly seen on the delayed images. This lesion is indeterminate by CT and could be benign or malignant. Imaging possibilities for with further workup might include hepatic protocol MRI with and without contrast or PET-CT. 3. Lucent lesion of the left posterior L2 vertebral body potentially extending slightly into the pedicle, measuring 1.9 by 1.4 cm on image 116 of series 5. This previously measured 1.6 by 1.3 cm on 03/28/2010, accordingly making it unlikely to be a metastatic lesion. 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 4. Small right middle lobe and right lower lobe pulmonary nodules are likely benign but merit surveillance in this context.   11/18/2020 Initial Diagnosis   Rectal adenocarcinoma ([place])   11/23/2020 Cancer Staging   Staging form: Colon and Rectum, AJCC 8th Edition - Clinical stage from 11/23/2020: Stage IIIB (cT3, cN1, cM0) - Signed by [name], NP on 11/23/2020 Stage prefix: Initial diagnosis   12/02/2020 Imaging   MRI Liver  IMPRESSION: Tiny benign hemangioma in the right hepatic lobe, which corresponds to the lesion seen on recent CT. No evidence of metastatic disease within the liver or abdomen.   12/05/2020 -  Radiation Therapy   Concurrent chemoradiation with Dr Lisbeth Renshaw    12/05/2020 -  Chemotherapy   Concurrent chemoradiation with Xeloda 2000mg   in the AM and 1500mg  in the PM M-F on days of Radiation      CURRENT THERAPY:  Concurrent chemoradiation with Xeloda 2000mg  in the AM and 1500mg  in the PM M-F on days of Radiation  INTERVAL HISTORY:  Cesar Herrera is here for a follow up of rectal cancer. He presents to the clinic with his wife. He notes he is more open to ileostomy, but would like to have it for  as short as possible. He will consider adjuvant chemo after surgery. He is interested in what foods is best for him or what to avoid.  He notes having tingling of his inner upper thigh, groin and around genitals. He denies issues with urination.     REVIEW OF SYSTEMS:   Constitutional: Denies fevers, chills or abnormal weight loss Eyes: Denies blurriness of vision Ears, nose, mouth, throat, and face: Denies mucositis or sore throat Respiratory: Denies cough, dyspnea or wheezes Cardiovascular: Denies palpitation, chest discomfort or lower extremity swelling Gastrointestinal:  Denies nausea, heartburn or change in bowel habits Skin: Denies abnormal skin rashes Lymphatics: Denies new lymphadenopathy or easy bruising Neurological: (+) Tingling of b/l groin area Behavioral/Psych: Mood is stable, no new changes  All other systems were reviewed with the patient and are negative.  MEDICAL HISTORY:  Past Medical History:  Diagnosis Date  . Cancer ([place])   . H/O epididymitis 2011  . Seasonal allergies     SURGICAL HISTORY: Past Surgical History:  Procedure Laterality Date  . WISDOM TOOTH EXTRACTION      I have reviewed the social history and family history with the patient and they are unchanged from previous note.  ALLERGIES:  is allergic to erythromycin and penicillins.  MEDICATIONS:  Current Outpatient Medications  Medication Sig Dispense Refill  . ALPRAZolam (XANAX) 0.25 MG tablet Take 1 tablet (0.25 mg total) by mouth 2 (two) times daily as needed for anxiety. 30 tablet 0  . capecitabine (XELODA) 500 MG tablet TAKE 4 TABS (2000 MG) IN THE MORNING AND 3 TABS (1500 MG) IN THE EVENING ON DAYS OF RADIATION ONLY, MONDAY THROUGH FRIDAY. TAKE AFTER A MEAL 105 tablet 1  . capecitabine (XELODA) 500 MG tablet TAKE 4 TABS IN MORNING AND 3 TABS IN EVENING ON DAYS OF RADIATION, MONDAY THROUGH FRIDAYS 105 tablet 1  . Multiple Vitamins-Minerals (MULTIVITAMIN) tablet Take 1 tablet by mouth daily.      No current facility-administered medications for this visit.    PHYSICAL EXAMINATION: ECOG PERFORMANCE STATUS: 0 - Asymptomatic  Vitals:   12/05/20 1231  BP: 134/77  Pulse: 75  Resp: 19  Temp: (!) 97.5 F (36.4 C)  SpO2: 98%   Filed Weights   12/05/20 1231  Weight: 182 lb 3.2 oz (82.6 kg)   Due to COVID19 we will limit examination to appearance. Patient had no complaints.  GENERAL:alert, no distress and comfortable SKIN: skin color normal, no rashes or significant lesions EYES: normal, Conjunctiva are pink and non-injected, sclera clear  NEURO: alert & oriented x 3 with fluent speech   LABORATORY DATA:  I have reviewed the data as listed CBC Latest Ref Rng & Units 12/05/2020 11/23/2020 09/09/2019  WBC 4.0 - 10.5 K/uL 6.2 6.2 5.3  Hemoglobin 13.0 - 17.0 g/dL 14.0 14.8 14.7  Hematocrit 39.0 - 52.0 % 41.9 43.9 43.3  Platelets 150 - 400 K/uL 222 247 208.0     CMP Latest Ref Rng & Units 12/05/2020 11/23/2020 09/09/2019  Glucose 70 - 99 mg/dL 114(H) 98 109(H)  BUN 6 - 20 mg/dL 14 14 12   Creatinine 0.61 - 1.24 mg/dL 1.32(H) 1.11 1.05  Sodium 135 - 145 mmol/L 140 140 137  Potassium 3.5 - 5.1 mmol/L 4.4 4.4 4.1  Chloride 98 - 111 mmol/L 105 104 103  CO2 22 - 32 mmol/L 24 26 27   Calcium 8.9 - 10.3 mg/dL 8.9 9.4 9.6  Total Protein 6.5 - 8.1 g/dL 6.9 7.3 7.2  Total Bilirubin 0.3 - 1.2 mg/dL 0.4 0.4 0.5  Alkaline Phos 38 - 126 U/L 67 65 59  AST 15 - 41 U/L 20 16 20   ALT 0 - 44 U/L 22 23 24       RADIOGRAPHIC STUDIES: I have personally reviewed the radiological images as listed and agreed with the findings in the report. No results found.   ASSESSMENT & PLAN:  JAYREN CEASE is a 50 y.o. Herrera with    1.  Adenocarcinoma of the rectum, cT3N0/N1, stage II or III -He was diagnosed in 11/2020 by colonoscopy which showed upper/mid rectal adenocarcinoma.  -Staging work-up shows 1.4 hypodense lesion in the right lobe, otherwise no evidence of distant metastatic disease.   -Local  staging pelvic MRI shows T3N1 disease, however the node is a single 5 mm mesorectal lymph node and could be reactive, could be N0.  Liver MRI negative. Overall this is clinical stage II-III rectal cancer. -I recommend standard treatment with neoadjuvant concurrent chemo/RT with Xeloda 2000mg  in the AM and 1500mg  in the PM M-F on days of Radiation. This will be followed by curative surgery, then 4 months adjuvant chemo based on final surgical path.  If he has significant residual disease T4 or node positive will recommend Xeloda/Capox/FOLFOX. He agrees with plan.  -He has seen local surgeon Dr. Leighton Ruff and has second opinion with Dr. Morton Stall at [facility] reviewed, overall adequate to start treatment. Chemo consent obtained. He will start week 1 Xeloda today along with Radiation. I reviewed side effects to watch for and briefly reviewed symptoms management. I encouraged him to contact clinic if he has any significant or unexpected side effects.  -F/u on 4/22. Will monitor with weekly labs.    2. Genetics  -He qualifies for genetics due to his young age, he is interested and agreeable.  A referral was placed today. -His daughters are ages 69 and 42.  We briefly discussed initiating their screenings when they reached the age of 47 which is 10 years prior to his diagnosis -If he is found to have genetic mutation, we would recommend for his children (and ? Siblings) to get tested as appropriate    3. Diarrhea, Neurological symptoms with ache, secondary to #1 -He notes having numbness and tingling of his inner upper thigh, groin and around genitals. This may ache but no true pain, no change in urination.  -Will monitor.    PLAN: -Start Xeloda 2000mg  in the AM and 1500mg  in the PM M-F beginning today  -Continue Radiation  -genetic appointment later this week  -Refer to Dietician for questions  -Lab weekly  -Lab and F/u on 4/22 and 5/2 -Will write letter for trip refund given  cancer treatment.    No problem-specific Assessment & Plan notes found for this encounter.   Orders Placed This Encounter  Procedures  . Ambulatory Referral to [facility]    Referral Priority:   Routine    Referral Type:   Consultation    Referral Reason:   Specialty Services Required  Number of Visits Requested:   1   All questions were answered. The patient knows to call the clinic with any problems, questions or concerns. No barriers to learning was detected. The total time spent in the appointment was 30 minutes.     [name], MD 12/05/2020   I, Joslyn Devon, am acting as scribe for [name], MD.   I have reviewed the above documentation for accuracy and completeness, and I agree with the above.

## 2020-12-05 ENCOUNTER — Encounter: Payer: Self-pay | Admitting: Hematology

## 2020-12-05 ENCOUNTER — Ambulatory Visit
Admission: RE | Admit: 2020-12-05 | Discharge: 2020-12-05 | Disposition: A | Discharge status: Home / Self Care | Payer: 59 | Source: Ambulatory Visit | Attending: Radiation Oncology | Admitting: Radiation Oncology

## 2020-12-05 ENCOUNTER — Inpatient Hospital Stay (HOSPITAL_BASED_OUTPATIENT_CLINIC_OR_DEPARTMENT_OTHER): Payer: 59 | Admitting: Hematology

## 2020-12-05 ENCOUNTER — Inpatient Hospital Stay: Payer: 59

## 2020-12-05 ENCOUNTER — Other Ambulatory Visit: Payer: Self-pay | Admitting: Genetic Counselor

## 2020-12-05 ENCOUNTER — Telehealth: Payer: Self-pay

## 2020-12-05 ENCOUNTER — Other Ambulatory Visit: Payer: Self-pay

## 2020-12-05 VITALS — BP 134/77 | HR 75 | Temp 97.5°F | Resp 19 | Ht 67.0 in | Wt 182.2 lb

## 2020-12-05 DIAGNOSIS — C20 Malignant neoplasm of rectum: Secondary | ICD-10-CM | POA: Diagnosis not present

## 2020-12-05 LAB — CBC WITH DIFFERENTIAL (CANCER CENTER ONLY)
Abs Immature Granulocytes: 0.01 10*3/uL (ref 0.00–0.07)
Basophils Absolute: 0 10*3/uL (ref 0.0–0.1)
Basophils Relative: 1 %
Eosinophils Absolute: 0.2 10*3/uL (ref 0.0–0.5)
Eosinophils Relative: 4 %
HCT: 41.9 % (ref 39.0–52.0)
Hemoglobin: 14 g/dL (ref 13.0–17.0)
Immature Granulocytes: 0 %
Lymphocytes Relative: 21 %
Lymphs Abs: 1.3 10*3/uL (ref 0.7–4.0)
MCH: 29 pg (ref 26.0–34.0)
MCHC: 33.4 g/dL (ref 30.0–36.0)
MCV: 86.9 fL (ref 80.0–100.0)
Monocytes Absolute: 0.4 10*3/uL (ref 0.1–1.0)
Monocytes Relative: 6 %
Neutro Abs: 4.3 10*3/uL (ref 1.7–7.7)
Neutrophils Relative %: 68 %
Platelet Count: 222 10*3/uL (ref 150–400)
RBC: 4.82 MIL/uL (ref 4.22–5.81)
RDW: 12.1 % (ref 11.5–15.5)
WBC Count: 6.2 10*3/uL (ref 4.0–10.5)
nRBC: 0 % (ref 0.0–0.2)

## 2020-12-05 LAB — CMP (CANCER CENTER ONLY)
ALT: 22 U/L (ref 0–44)
AST: 20 U/L (ref 15–41)
Albumin: 4.1 g/dL (ref 3.5–5.0)
Alkaline Phosphatase: 67 U/L (ref 38–126)
Anion gap: 11 (ref 5–15)
BUN: 14 mg/dL (ref 6–20)
CO2: 24 mmol/L (ref 22–32)
Calcium: 8.9 mg/dL (ref 8.9–10.3)
Chloride: 105 mmol/L (ref 98–111)
Creatinine: 1.32 mg/dL — ABNORMAL HIGH (ref 0.61–1.24)
GFR, Estimated: >60 mL/min (ref >60)
Glucose, Bld: 114 mg/dL — ABNORMAL HIGH (ref 70–99)
Potassium: 4.4 mmol/L (ref 3.5–5.1)
Sodium: 140 mmol/L (ref 135–145)
Total Bilirubin: 0.4 mg/dL (ref 0.3–1.2)
Total Protein: 6.9 g/dL (ref 6.5–8.1)

## 2020-12-05 NOTE — Telephone Encounter (Signed)
-----   Message from [name], NP sent at 12/05/2020  2:15 PM EDT ----- Please let him know kidney function is slightly decreased, needs to hydrate well while on Xeloda. Dr. Burr Medico plans to repeat lab next week.   Thanks, Regan Rakers, NP

## 2020-12-05 NOTE — Telephone Encounter (Signed)
Called and left message for patient encouraging him to increase fluids lab showed kidney function slightly decreased

## 2020-12-06 ENCOUNTER — Ambulatory Visit
Admission: RE | Admit: 2020-12-06 | Discharge: 2020-12-06 | Disposition: A | Discharge status: Home / Self Care | Payer: 59 | Source: Ambulatory Visit | Attending: Radiation Oncology | Admitting: Radiation Oncology

## 2020-12-06 DIAGNOSIS — C20 Malignant neoplasm of rectum: Secondary | ICD-10-CM | POA: Diagnosis not present

## 2020-12-07 ENCOUNTER — Telehealth: Payer: Self-pay | Admitting: Nutrition

## 2020-12-07 ENCOUNTER — Ambulatory Visit
Admission: RE | Admit: 2020-12-07 | Discharge: 2020-12-07 | Disposition: A | Discharge status: Home / Self Care | Payer: 59 | Source: Ambulatory Visit | Attending: Radiation Oncology | Admitting: Radiation Oncology

## 2020-12-07 DIAGNOSIS — C20 Malignant neoplasm of rectum: Secondary | ICD-10-CM | POA: Diagnosis not present

## 2020-12-07 NOTE — Telephone Encounter (Signed)
Scheduled appt per 4/4 sch msg. Called pt, no answer. Left msg with appt date and time.

## 2020-12-08 ENCOUNTER — Other Ambulatory Visit: Payer: Self-pay

## 2020-12-08 ENCOUNTER — Inpatient Hospital Stay (HOSPITAL_BASED_OUTPATIENT_CLINIC_OR_DEPARTMENT_OTHER): Payer: 59 | Admitting: Genetic Counselor

## 2020-12-08 ENCOUNTER — Ambulatory Visit
Admission: RE | Admit: 2020-12-08 | Discharge: 2020-12-08 | Disposition: A | Discharge status: Home / Self Care | Payer: 59 | Source: Ambulatory Visit | Attending: Radiation Oncology | Admitting: Radiation Oncology

## 2020-12-08 ENCOUNTER — Inpatient Hospital Stay: Payer: 59

## 2020-12-08 DIAGNOSIS — C20 Malignant neoplasm of rectum: Secondary | ICD-10-CM | POA: Diagnosis not present

## 2020-12-08 DIAGNOSIS — Z8052 Family history of malignant neoplasm of bladder: Secondary | ICD-10-CM

## 2020-12-08 DIAGNOSIS — Z807 Family history of other malignant neoplasms of lymphoid, hematopoietic and related tissues: Secondary | ICD-10-CM

## 2020-12-08 NOTE — Progress Notes (Incomplete)
Pt here for patient teaching.  Pt given Radiation and You booklet.  Reviewed areas of pertinence such as diarrhea, fatigue, hair loss, sexual and fertility changes and skin changes . Pt able to give teach back of to pat skin, use unscented/gentle soap, use baby wipes, have Imodium on hand, drink plenty of water and sitz bath,avoid applying anything to skin within 4 hours of treatment. Pt verbalizes understanding of information given and will contact nursing with any questions or concerns.

## 2020-12-09 ENCOUNTER — Ambulatory Visit
Admission: RE | Admit: 2020-12-09 | Discharge: 2020-12-09 | Disposition: A | Discharge status: Home / Self Care | Payer: 59 | Source: Ambulatory Visit | Attending: Radiation Oncology | Admitting: Radiation Oncology

## 2020-12-09 ENCOUNTER — Encounter: Payer: Self-pay | Admitting: Genetic Counselor

## 2020-12-09 DIAGNOSIS — C20 Malignant neoplasm of rectum: Secondary | ICD-10-CM | POA: Diagnosis not present

## 2020-12-09 DIAGNOSIS — Z807 Family history of other malignant neoplasms of lymphoid, hematopoietic and related tissues: Secondary | ICD-10-CM | POA: Insufficient documentation

## 2020-12-09 DIAGNOSIS — Z8052 Family history of malignant neoplasm of bladder: Secondary | ICD-10-CM | POA: Insufficient documentation

## 2020-12-09 NOTE — Progress Notes (Signed)
REFERRING PROVIDER: [name], NP [place],  [place] [id number]  PRIMARY PROVIDER:  [name], MD  PRIMARY REASON FOR VISIT:  1. Rectal adenocarcinoma ([place])   2. Family history of lymphoma   3. Family history of bladder cancer       HISTORY OF PRESENT ILLNESS:   Cesar Herrera, a 50 y.o. male, was seen for a [place] cancer genetics consultation at the request of Cira Rue, NP, due to a personal and family history of cancer.  Cesar Herrera presents to clinic today with his wife, Morey Hummingbird, to discuss the possibility of a hereditary predisposition to cancer, genetic testing, and to further clarify his future cancer risks, as well as potential cancer risks for family members.   In March of 2022, at the age of 68, Cesar Herrera was diagnosed with rectal adenocarcinoma. The treatment plan includes neoadjuvant chemoradiation and surgery.   CANCER HISTORY:  Oncology History Overview Note  Cancer Staging Rectal adenocarcinoma [facility]) Staging form: Colon and Rectum, AJCC 8th Edition - Clinical stage from 11/23/2020: Stage IIIB (cT3, cN1, cM0) - Signed by [name], NP on 11/23/2020 Stage prefix: Initial diagnosis    Rectal adenocarcinoma ([employer])  11/11/2020 Procedure   Colonoscopy by Dr. Ardis Hughs Impression - Three 2 to 5 mm polyps in the descending colon, in the transverse colon and in the cecum, removed with a cold snare. Complete resection. 2/3 polyps retrieved, sent to pathology. - Rectal tumor that appears malignant, distal edge 7cm from the anal verge. This was biopsied and then labeled with Spot submucosal tattoo. - The examination was otherwise normal on direct and retroflexion views.   11/11/2020 Initial Biopsy   Diagnosis 1. Descending Colon Polyp - TUBULAR ADENOMA (3 OF 3 FRAGMENTS) - NO HIGH-GRADE DYSPLASIA OR MALIGNANCY IDENTIFIED 2. Rectum, biopsy - ADENOCARCINOMA, AT LEAST INTRAMUCOSAL   11/11/2020 Tumor Marker   CEA: 1.4 CA 19-9: 10   11/15/2020 Imaging    Pelvic MRI for local staging IMPRESSION: Signs of potential T3 B disease, tortuosity of the rectum at the level of the tumor makes assessment difficult, distorted by the polypoid eccentric tumor in the high rectum as described.   N1 disease.   No extramural venous invasion. Tumor extends just to the level of the APR in this center below this level.   11/17/2020 Imaging   CT CAP IMPRESSION: 1. Known rectal mass with a 5 mm in short axis left mesorectal lymph node better shown on the prior exam and only faintly apparent on the CT. 2. 1.4 by 1.2 by 0.9 cm hypodense lesion in the right hepatic lobe on image 50 of series 2, poorly seen on the delayed images. This lesion is indeterminate by CT and could be benign or malignant. Imaging possibilities for with further workup might include hepatic protocol MRI with and without contrast or PET-CT. 3. Lucent lesion of the left posterior L2 vertebral body potentially extending slightly into the pedicle, measuring 1.9 by 1.4 cm on image 116 of series 5. This previously measured 1.6 by 1.3 cm on 03/28/2010, accordingly making it unlikely to be a metastatic lesion. 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 4. Small right middle lobe and right lower lobe pulmonary nodules are likely benign but merit surveillance in this context.   11/18/2020 Initial Diagnosis   Rectal adenocarcinoma ([place])   11/23/2020 Cancer Staging   Staging form: Colon and Rectum, AJCC 8th Edition -  Clinical stage from 11/23/2020: Stage IIIB (cT3, cN1, cM0) - Signed by [name], NP on 11/23/2020 Stage prefix: Initial diagnosis   12/02/2020 Imaging   MRI Liver  IMPRESSION: Tiny benign hemangioma in the right hepatic lobe, which corresponds to the lesion seen on recent CT. No evidence of metastatic disease within the liver or abdomen.   12/05/2020 -  Radiation Therapy   Concurrent chemoradiation with Dr Lisbeth Renshaw     12/05/2020 -  Chemotherapy   Concurrent chemoradiation with Xeloda 2058m in the AM and 15089min the PM M-F on days of Radiation       Past Medical History:  Diagnosis Date  . Cancer ([place]  . Family history of bladder cancer   . Family history of lymphoma   . H/O epididymitis 2011  . Seasonal allergies     Past Surgical History:  Procedure Laterality Date  . WISDOM TOOTH EXTRACTION      Social History   Socioeconomic History  . Marital status: Married    Spouse name: Not on file  . Number of children: Not on file  . Years of education: Not on file  . Highest education level: Not on file  Occupational History  . Not on file  Tobacco Use  . Smoking status: Former Smoker    Types: Cigarettes    Quit date: 07/29/2003    Years since quitting: 17.3  . Smokeless tobacco: Never Used  . Tobacco comment: smoked 1993-2004, up to < 4 cigarettes/ day (socially)  Vaping Use  . Vaping Use: Never used  Substance and Sexual Activity  . Alcohol use: Yes    Comment: 1-2 drinks , wine or beer  . Drug use: No  . Sexual activity: Yes  Other Topics Concern  . Not on file  Social History Narrative   Exercise: mountain biking, hiking, walking - tends to exercise in spurts, does some yoga   Social Determinants of Health   Financial Resource Strain: Not on file  Food Insecurity: Not on file  Transportation Needs: Not on file  Physical Activity: Not on file  Stress: Not on file  Social Connections: Not on file     FAMILY HISTORY:  We obtained a detailed, 4-generation family history.  Significant diagnoses are listed below: Family History  Problem Relation Age of Onset  . Diabetes Maternal Uncle   . Diabetes Father   . Colon polyps Father        1 polyp  . Stroke Other        PG aunts & PG uncles  . Heart disease Paternal Grandfather   . Bladder Cancer Paternal Aunt   . Lymphoma Nephew 15  . Other Nephew 2       kidney transplant  . Cancer Neg Hx   . Colon cancer Neg  Hx   . Stomach cancer Neg Hx   . Esophageal cancer Neg Hx    Cesar Herrera two daughters (ages 1568nd 1314 He has one brother (age 4637who has four children. One of the nephews was diagnosed with lymphoma at age 6243which was thought to be related to post-kidney transplant medication.  Mr. IvKinnardother is alive at age 5073ithout cancer. There was one maternal uncle who did not have cancer. There is no known cancer among maternal cousins. Cesar Herrera grandmother died in her 8049sithout cancer. His maternal grandfather died in his early 3078sithout cancer. There is a maternal great-uncle who had GI problems,  although Cesar Herrera does not know if it was cancer.  Cesar Herrera father is alive at age 54 without cancer. There is one paternal aunt who has a history of bladder cancer. There is no known cancer among paternal cousins. Cesar Herrera paternal grandmother died at age 67 without cancer. His paternal grandfather died at age 30 without cancer.   Cesar Herrera is unaware of previous family history of genetic testing for hereditary cancer risks. Patient's maternal ancestors are of [place] descent, and paternal ancestors are of [place], [place], and [place] descent. There is no reported Ashkenazi Jewish ancestry. There is no known consanguinity.  GENETIC COUNSELING ASSESSMENT: Cesar Herrera is a 50 y.o. male with a personal history of young-onset rectal cancer and a family history of bladder cancer and lymphoma, which is somewhat suggestive of a hereditary cancer syndrome and predisposition to cancer. We, therefore, discussed and recommended the following at today's visit.   DISCUSSION: We discussed that approximately 5-7% of colorectal cancer is hereditary, with most cases associated with the Lynch syndrome genes (EPCAM, MLH1, MSH2, MSH6, and PMS2). We reviewed that Lynch syndrome is a genetic condition that increases the risk for mainly colorectal and endometrial (uterine) cancers, but also other cancers (e.g.  stomach, small bowel, bladder, kidney, and ovarian cancer, among others). There are other genes that can be associated with hereditary colorectal cancer syndromes. These include APC, MUTYH, CHEK2, etc. We discussed that testing is beneficial for several reasons, including knowing about other cancer risks, identifying potential screening and risk-reduction options that may be appropriate, and to understand if other family members could be at risk for cancer and allow them to undergo genetic testing.   We reviewed the characteristics, features and inheritance patterns of hereditary cancer syndromes. We also discussed genetic testing, including the appropriate family members to test, the process of testing, insurance coverage and turn-around-time for results. We discussed the implications of a negative, positive and/or variant of uncertain significant result. We recommended Cesar Herrera pursue genetic testing for the [facility] CancerNext-Expanded + RNAinsight panel.   The CancerNext-Expanded + RNAinsight gene panel offered by [employer] and includes sequencing and rearrangement analysis for the following 77 genes: AIP, ALK, APC, ATM, AXIN2, BAP1, BARD1, BLM, BMPR1A, BRCA1, BRCA2, BRIP1, CDC73, CDH1, CDK4, CDKN1B, CDKN2A, CHEK2, CTNNA1, DICER1, FANCC, FH, FLCN, GALNT12, KIF1B, LZTR1, MAX, MEN1, MET, MLH1, MSH2, MSH3, MSH6, MUTYH, NBN, NF1, NF2, NTHL1, PALB2, PHOX2B, PMS2, POT1, PRKAR1A, PTCH1, PTEN, RAD51C, RAD51D, RB1, RECQL, RET, SDHA, SDHAF2, SDHB, SDHC, SDHD, SMAD4, SMARCA4, SMARCB1, SMARCE1, STK11, SUFU, TMEM127, TP53, TSC1, TSC2, VHL and XRCC2 (sequencing and deletion/duplication); EGFR, EGLN1, HOXB13, KIT, MITF, PDGFRA, POLD1 and POLE (sequencing only); EPCAM and GREM1 (deletion/duplication only). RNA data is routinely analyzed for use in variant interpretation for all genes.  Based on Cesar Herrera's personal and family history of cancer, he meets medical criteria for genetic testing. Despite that he meets  criteria, there may still be an out of pocket cost. We discussed that if his out of pocket cost for testing is over $100, the laboratory will reach out to let him know. If the out of pocket cost of testing is less than $100 he will be billed by the genetic testing laboratory.   PLAN: After considering the risks, benefits, and limitations, Cesar Herrera provided informed consent to pursue genetic testing and the blood sample was sent to [facility] for analysis of the CancerNext-Expanded + RNAinsight panel. Results should be available within approximately two-three weeks' time, at which point they will  be disclosed by telephone to Mr. Staszak, as will any additional recommendations warranted by these results. Mr. Sedlacek will receive a summary of his genetic counseling visit and a copy of his results once available. This information will also be available in Epic.   Mr. Boehning questions were answered to his satisfaction today. Our contact information was provided should additional questions or concerns arise. Thank you for the referral and allowing [place] to share in the care of your patient.   Clint Guy, [place], [facility] Licensed, Certified Dispensing [email or web address] Phone: [phone]  The patient was seen for a total of 40 minutes in face-to-face genetic counseling.  This patient was discussed with Drs. Magrinat, Lindi Adie and/or Burr Medico who agrees with the above.    _______________________________________________________________________ For Office Staff:  Number of people involved in session: 1 Was an Intern/ student involved with case: no

## 2020-12-12 ENCOUNTER — Inpatient Hospital Stay: Payer: 59

## 2020-12-12 ENCOUNTER — Other Ambulatory Visit: Payer: 59

## 2020-12-12 ENCOUNTER — Ambulatory Visit
Admission: RE | Admit: 2020-12-12 | Discharge: 2020-12-12 | Disposition: A | Discharge status: Home / Self Care | Payer: 59 | Source: Ambulatory Visit | Attending: Radiation Oncology | Admitting: Radiation Oncology

## 2020-12-12 ENCOUNTER — Inpatient Hospital Stay: Payer: 59 | Admitting: Nutrition

## 2020-12-12 ENCOUNTER — Other Ambulatory Visit: Payer: Self-pay

## 2020-12-12 DIAGNOSIS — C20 Malignant neoplasm of rectum: Secondary | ICD-10-CM

## 2020-12-12 LAB — CMP (CANCER CENTER ONLY)
ALT: 22 U/L (ref 0–44)
AST: 18 U/L (ref 15–41)
Albumin: 4 g/dL (ref 3.5–5.0)
Alkaline Phosphatase: 59 U/L (ref 38–126)
Anion gap: 9 (ref 5–15)
BUN: 11 mg/dL (ref 6–20)
CO2: 26 mmol/L (ref 22–32)
Calcium: 8.9 mg/dL (ref 8.9–10.3)
Chloride: 104 mmol/L (ref 98–111)
Creatinine: 1.01 mg/dL (ref 0.61–1.24)
GFR, Estimated: >60 mL/min (ref >60)
Glucose, Bld: 118 mg/dL — ABNORMAL HIGH (ref 70–99)
Potassium: 4 mmol/L (ref 3.5–5.1)
Sodium: 139 mmol/L (ref 135–145)
Total Bilirubin: 0.3 mg/dL (ref 0.3–1.2)
Total Protein: 6.6 g/dL (ref 6.5–8.1)

## 2020-12-12 LAB — CBC WITH DIFFERENTIAL (CANCER CENTER ONLY)
Abs Immature Granulocytes: 0.01 10*3/uL (ref 0.00–0.07)
Basophils Absolute: 0 10*3/uL (ref 0.0–0.1)
Basophils Relative: 1 %
Eosinophils Absolute: 0.2 10*3/uL (ref 0.0–0.5)
Eosinophils Relative: 4 %
HCT: 40.4 % (ref 39.0–52.0)
Hemoglobin: 13.8 g/dL (ref 13.0–17.0)
Immature Granulocytes: 0 %
Lymphocytes Relative: 26 %
Lymphs Abs: 0.9 10*3/uL (ref 0.7–4.0)
MCH: 29.7 pg (ref 26.0–34.0)
MCHC: 34.2 g/dL (ref 30.0–36.0)
MCV: 86.9 fL (ref 80.0–100.0)
Monocytes Absolute: 0.3 10*3/uL (ref 0.1–1.0)
Monocytes Relative: 7 %
Neutro Abs: 2.2 10*3/uL (ref 1.7–7.7)
Neutrophils Relative %: 62 %
Platelet Count: 195 10*3/uL (ref 150–400)
RBC: 4.65 MIL/uL (ref 4.22–5.81)
RDW: 11.9 % (ref 11.5–15.5)
WBC Count: 3.6 10*3/uL — ABNORMAL LOW (ref 4.0–10.5)
nRBC: 0 % (ref 0.0–0.2)

## 2020-12-12 LAB — GENETIC SCREENING ORDER

## 2020-12-12 NOTE — Progress Notes (Signed)
50 year old male diagnosed with rectal cancer receiving concurrent chemoradiation therapy with Xeloda.  Past medical history is not significant.  Medications include Xanax, Xeloda, multivitamin.  Labs include glucose 114 and creatinine 1.32 on April 4. Height: 5 feet 7 inches. Weight: 182.2 pounds. Usual body weight: Approximately 180 pounds per patient. BMI: 28.54.  Patient reports he has a good appetite and denies mouth sores or difficulty eating at this time. Reports 3 stools a day however they are not watery at this time. Reports drinking 6 to 8 - 20 ounce bottles of water daily.  Reports chemotherapy makes him feel dehydrated. Reports eating a healthy diet consisting of eggs and fruit at breakfast, varieties of food at lunch and dinner to include chicken, steamed vegetables, and leftovers.  Nutrition diagnosis: Food and nutrition related knowledge deficit related to new diagnosis of rectal cancer as evidenced by no prior need for nutrition related information.  Intervention: Educated to consume smaller more frequent meals and snacks with adequate calories and protein. Recommended patient strive for weight maintenance. Educated on strategies for eating with mouth sores, diarrhea, and weight loss.  Nutrition fact sheets were given.  Questions were answered.  Teach back used.  Contact information provided.  Monitoring, evaluation, goals: Patient will tolerate adequate calories and protein for weight maintenance throughout treatment.  Next visit: To be scheduled per patient request or as needed.  **Disclaimer: This note was dictated with voice recognition software. Similar sounding words can inadvertently be transcribed and this note may contain transcription errors which may not have been corrected upon publication of note.**

## 2020-12-13 ENCOUNTER — Ambulatory Visit
Admission: RE | Admit: 2020-12-13 | Discharge: 2020-12-13 | Disposition: A | Discharge status: Home / Self Care | Payer: 59 | Source: Ambulatory Visit | Attending: Radiation Oncology | Admitting: Radiation Oncology

## 2020-12-13 ENCOUNTER — Other Ambulatory Visit: Payer: Self-pay

## 2020-12-13 DIAGNOSIS — C20 Malignant neoplasm of rectum: Secondary | ICD-10-CM | POA: Diagnosis not present

## 2020-12-14 ENCOUNTER — Ambulatory Visit
Admission: RE | Admit: 2020-12-14 | Discharge: 2020-12-14 | Disposition: A | Discharge status: Home / Self Care | Payer: 59 | Source: Ambulatory Visit | Attending: Radiation Oncology | Admitting: Radiation Oncology

## 2020-12-14 ENCOUNTER — Telehealth: Payer: Self-pay

## 2020-12-14 DIAGNOSIS — C20 Malignant neoplasm of rectum: Secondary | ICD-10-CM | POA: Diagnosis not present

## 2020-12-14 NOTE — Telephone Encounter (Signed)
Patient's wife Morey Hummingbird called with concern over WBC results being 3.6.  I have spoken with Dr. Burr Medico and she states most likely from Xeloda.  I have reviewed neutropenic precautions with her and she verbalized an understanding.  She understands we will continue to monitor this.  As far as getting the second Covid booster shot Dr. Burr Medico states he can go ahead and get it or wait until his chemo/RT.

## 2020-12-15 ENCOUNTER — Other Ambulatory Visit: Payer: Self-pay

## 2020-12-15 ENCOUNTER — Other Ambulatory Visit (HOSPITAL_COMMUNITY): Payer: Self-pay

## 2020-12-15 ENCOUNTER — Ambulatory Visit
Admission: RE | Admit: 2020-12-15 | Discharge: 2020-12-15 | Disposition: A | Discharge status: Home / Self Care | Payer: 59 | Source: Ambulatory Visit | Attending: Radiation Oncology | Admitting: Radiation Oncology

## 2020-12-15 DIAGNOSIS — C20 Malignant neoplasm of rectum: Secondary | ICD-10-CM | POA: Diagnosis not present

## 2020-12-15 MED FILL — Capecitabine Tab 500 MG: ORAL | 21 days supply | Qty: 105 | Fill #0 | Status: CN

## 2020-12-16 ENCOUNTER — Other Ambulatory Visit (HOSPITAL_COMMUNITY): Payer: Self-pay

## 2020-12-16 ENCOUNTER — Other Ambulatory Visit: Payer: Self-pay | Admitting: Pharmacist

## 2020-12-16 ENCOUNTER — Ambulatory Visit
Admission: RE | Admit: 2020-12-16 | Discharge: 2020-12-16 | Disposition: A | Discharge status: Home / Self Care | Payer: 59 | Source: Ambulatory Visit | Attending: Radiation Oncology | Admitting: Radiation Oncology

## 2020-12-16 DIAGNOSIS — C20 Malignant neoplasm of rectum: Secondary | ICD-10-CM | POA: Diagnosis not present

## 2020-12-16 MED ORDER — CAPECITABINE 500 MG PO TABS: ORAL_TABLET | ORAL | 0 refills | Status: DC

## 2020-12-16 NOTE — Progress Notes (Signed)
Oral Chemotherapy Pharmacist Encounter  Due to insurance restriction Xeloda can no longer be filled at [facility]. Prescription has been e-scribed to [facility].  Patient received #105 tablets of Xeloda from [facility] (covers 15 XRT days), remaining refill sent to [facility] this should finish off the remainder of his radiation treatment.  Supportive information was faxed to [facility]. We will continue to follow medication access.   Benjamine Mola called and notified patient, provided with new pharmacy number.  Darl Pikes, PharmD, BCPS, [facility] Hematology/Oncology Clinical Pharmacist [facility] [phone]  12/16/2020 9:14 AM

## 2020-12-19 ENCOUNTER — Other Ambulatory Visit: Payer: Self-pay

## 2020-12-19 ENCOUNTER — Telehealth: Payer: Self-pay | Admitting: *Deleted

## 2020-12-19 ENCOUNTER — Ambulatory Visit
Admission: RE | Admit: 2020-12-19 | Discharge: 2020-12-19 | Disposition: A | Discharge status: Home / Self Care | Payer: 59 | Source: Ambulatory Visit | Attending: Radiation Oncology | Admitting: Radiation Oncology

## 2020-12-19 ENCOUNTER — Other Ambulatory Visit: Payer: Self-pay | Admitting: Medical

## 2020-12-19 ENCOUNTER — Ambulatory Visit (HOSPITAL_COMMUNITY)
Admission: RE | Admit: 2020-12-19 | Discharge: 2020-12-19 | Disposition: A | Discharge status: Home / Self Care | Payer: 59 | Source: Ambulatory Visit | Attending: Medical | Admitting: Medical

## 2020-12-19 ENCOUNTER — Inpatient Hospital Stay: Payer: 59 | Admitting: Medical

## 2020-12-19 VITALS — BP 115/78 | HR 66 | Temp 97.1°F | Resp 18 | Ht 67.0 in | Wt 180.4 lb

## 2020-12-19 DIAGNOSIS — R1084 Generalized abdominal pain: Secondary | ICD-10-CM | POA: Insufficient documentation

## 2020-12-19 DIAGNOSIS — C20 Malignant neoplasm of rectum: Secondary | ICD-10-CM

## 2020-12-19 IMAGING — DX DG ABDOMEN 2V
3 series · 3 of 3 positions shown · non-contrast
Comparison: None.

CLINICAL DATA: Rectal cancer.  Abdominal bloating and severe pain.

EXAM:
ABDOMEN - 2 VIEW

[abdomen supine (1 of 2)]
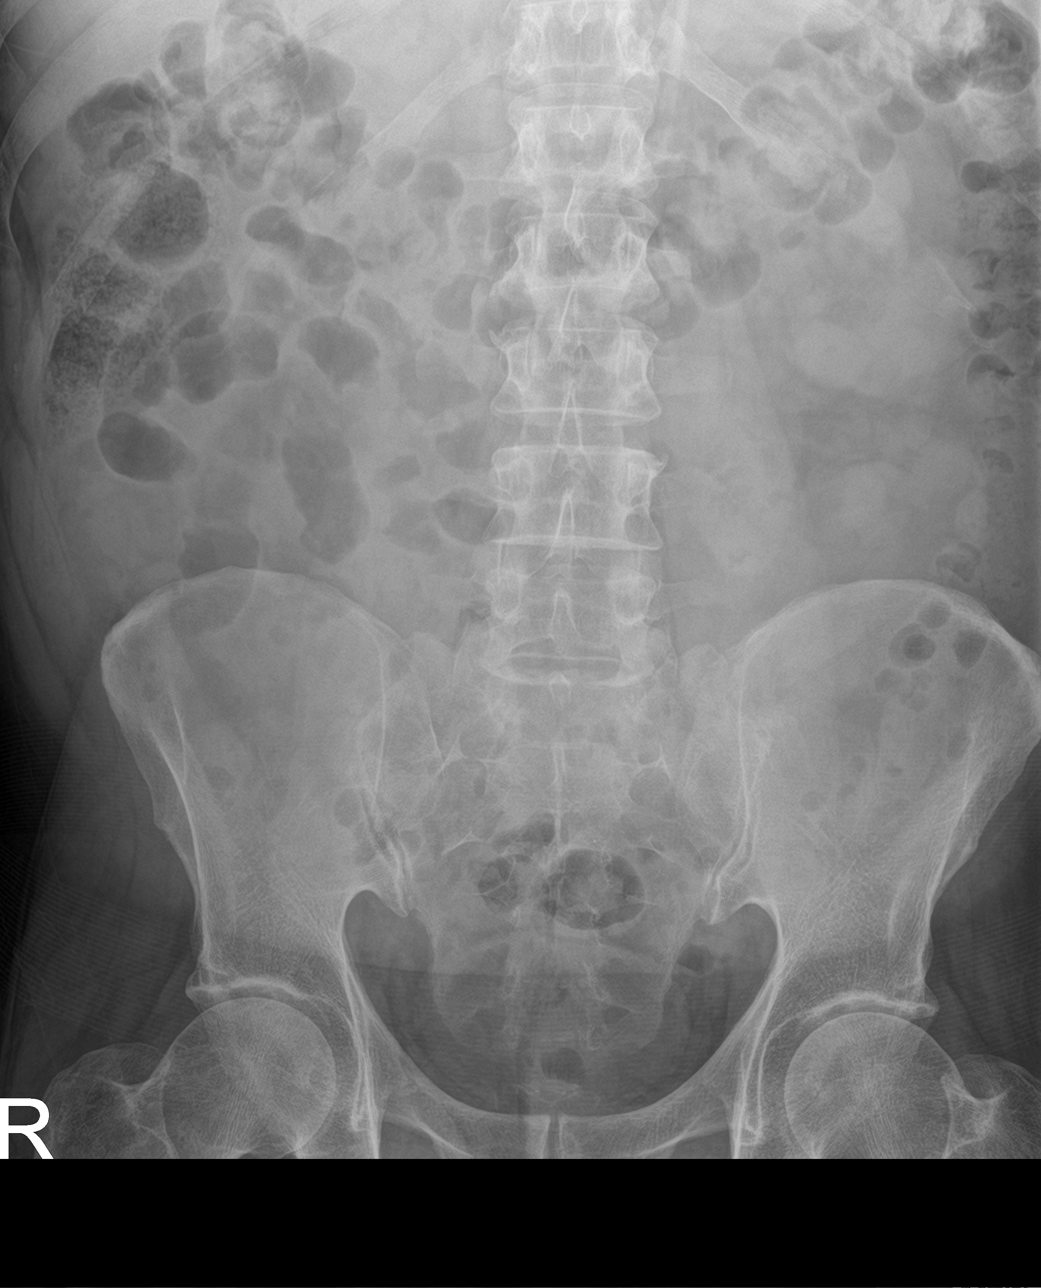

[abdomen supine (2 of 2)]
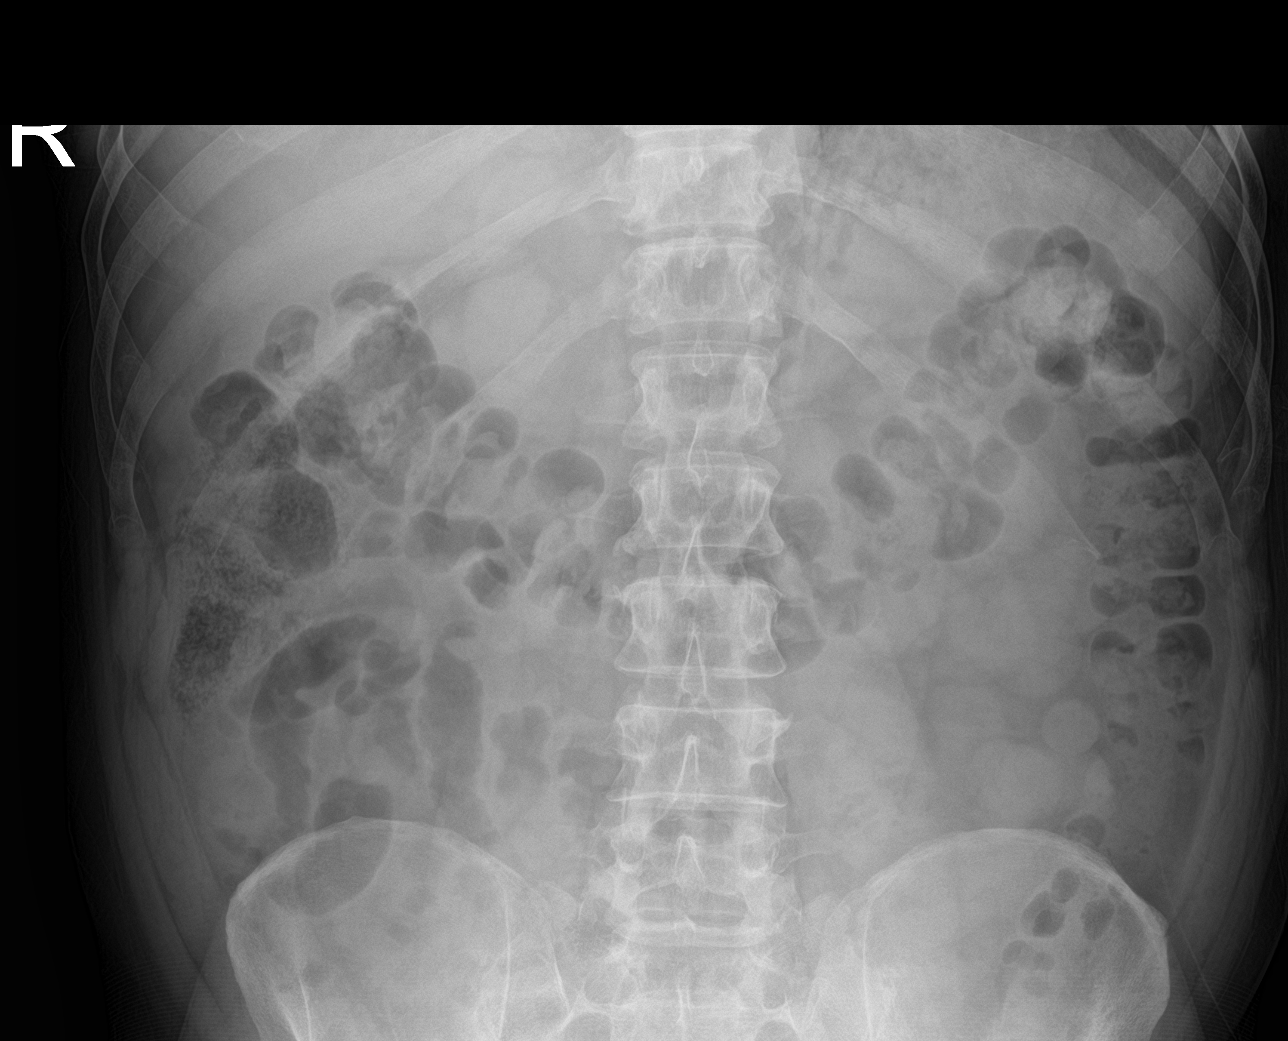

[abdomen erect]
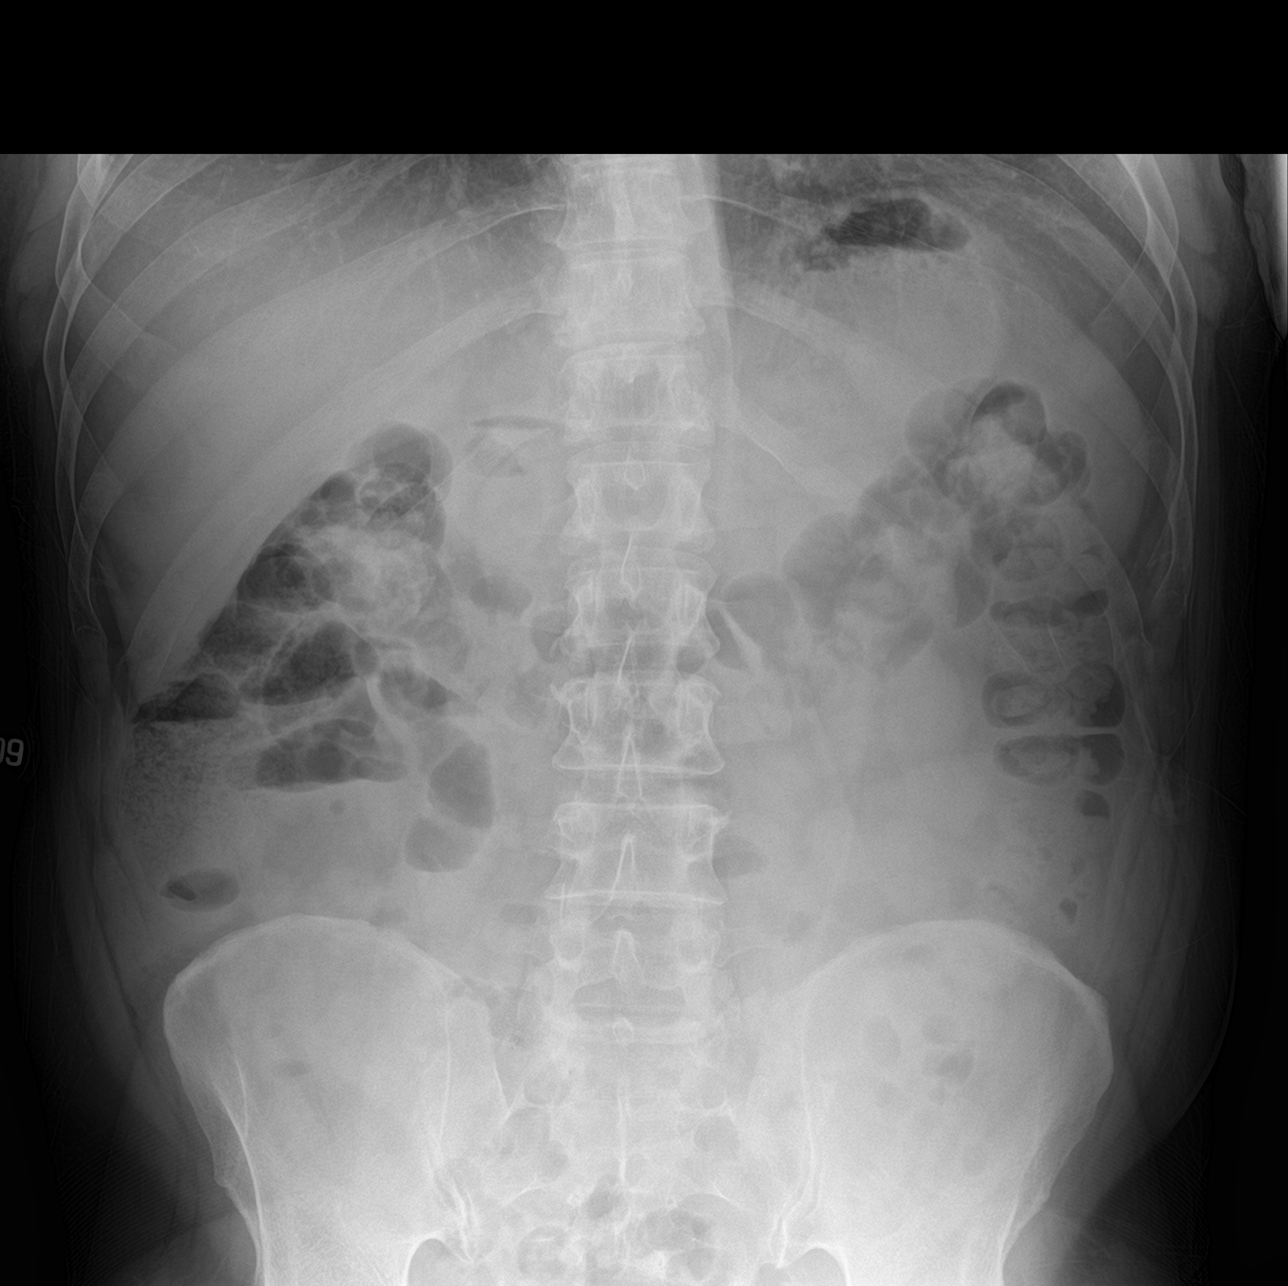

[3 of 3 positions shown; findings below may reference images not displayed]

FINDINGS: The bowel gas pattern is normal. There is no evidence of free air.
No radio-opaque calculi or other significant radiographic
abnormality is seen.
IMPRESSION: Negative abdominal radiographs.

## 2020-12-19 NOTE — Telephone Encounter (Signed)
Patient having abdominal pain, bloating, gas. Had a BM yesterday, but has never had this type of pain. Call after hours nurse and was instructed to get colace. Has taken colace this morning. No BM as of yet. Has rectal cancer  Will come in to see Sandi Mealy at 2:15 after RT.

## 2020-12-20 ENCOUNTER — Ambulatory Visit
Admission: RE | Admit: 2020-12-20 | Discharge: 2020-12-20 | Disposition: A | Discharge status: Home / Self Care | Payer: 59 | Source: Ambulatory Visit | Attending: Radiation Oncology | Admitting: Radiation Oncology

## 2020-12-20 ENCOUNTER — Other Ambulatory Visit: Payer: Self-pay | Admitting: Hematology

## 2020-12-20 DIAGNOSIS — C20 Malignant neoplasm of rectum: Secondary | ICD-10-CM

## 2020-12-21 ENCOUNTER — Other Ambulatory Visit: Payer: Self-pay

## 2020-12-21 ENCOUNTER — Ambulatory Visit
Admission: RE | Admit: 2020-12-21 | Discharge: 2020-12-21 | Disposition: A | Discharge status: Home / Self Care | Payer: 59 | Source: Ambulatory Visit | Attending: Radiation Oncology | Admitting: Radiation Oncology

## 2020-12-21 DIAGNOSIS — C20 Malignant neoplasm of rectum: Secondary | ICD-10-CM | POA: Diagnosis not present

## 2020-12-21 NOTE — Progress Notes (Signed)
Cesar Herrera   Telephone:([phone]) [phone] [phone]   Clinic Follow up Note   Patient Care Team: Cesar Rail, MD as PCP - General (Internal Medicine) Cesar Finner, RN as Oncology Nurse Navigator Cesar Feeling, NP as Nurse Practitioner (Nurse Practitioner) Cesar Merle, MD as Consulting Physician (Oncology)  Date of Service:  12/23/2020  CHIEF COMPLAINT: F/u of rectal cancer   SUMMARY OF ONCOLOGIC HISTORY: Oncology History Overview Note  Cancer Staging Rectal adenocarcinoma [facility]) Staging form: Colon and Rectum, AJCC 8th Edition - Clinical stage from 11/23/2020: Stage IIIB (cT3, cN1, cM0) - Signed by Cesar Feeling, NP on 11/23/2020 Stage prefix: Initial diagnosis    Rectal adenocarcinoma (Cesar Herrera)  11/11/2020 Procedure   Colonoscopy by Dr. Ardis Herrera Impression - Three 2 to 5 mm polyps in the descending colon, in the transverse colon and in the cecum, removed with a cold snare. Complete resection. 2/3 polyps retrieved, sent to pathology. - Rectal tumor that appears malignant, distal edge 7cm from the anal verge. This was biopsied and then labeled with Spot submucosal tattoo. - The examination was otherwise normal on direct and retroflexion views.   11/11/2020 Initial Biopsy   Diagnosis 1. Descending Colon Polyp - TUBULAR ADENOMA (3 OF 3 FRAGMENTS) - NO HIGH-GRADE DYSPLASIA OR MALIGNANCY IDENTIFIED 2. Rectum, biopsy - ADENOCARCINOMA, AT LEAST INTRAMUCOSAL   11/11/2020 Tumor Marker   CEA: 1.4 CA 19-9: 10   11/15/2020 Imaging   Pelvic MRI for local staging IMPRESSION: Signs of potential T3 B disease, tortuosity of the rectum at the level of the tumor makes assessment difficult, distorted by the polypoid eccentric tumor in the high rectum as described.   N1 disease.   No extramural venous invasion. Tumor extends just to the level of the APR in this center below this level.   11/17/2020 Imaging   CT CAP IMPRESSION: 1. Known rectal mass with a 5 mm in  short axis left mesorectal lymph node better shown on the prior exam and only faintly apparent on the CT. 2. 1.4 by 1.2 by 0.9 cm hypodense lesion in the right hepatic lobe on image 50 of series 2, poorly seen on the delayed images. This lesion is indeterminate by CT and could be benign or malignant. Imaging possibilities for with further workup might include hepatic protocol MRI with and without contrast or PET-CT. 3. Lucent lesion of the left posterior L2 vertebral body potentially extending slightly into the pedicle, measuring 1.9 by 1.4 cm on image 116 of series 5. This previously measured 1.6 by 1.3 cm on 03/28/2010, accordingly making it unlikely to be a metastatic lesion. 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 4. Small right middle lobe and right lower lobe pulmonary nodules are likely benign but merit surveillance in this context.   11/18/2020 Initial Diagnosis   Rectal adenocarcinoma (Cesar Herrera)   11/23/2020 Cancer Staging   Staging form: Colon and Rectum, AJCC 8th Edition - Clinical stage from 11/23/2020: Stage IIIB (cT3, cN1, cM0) - Signed by Cesar Feeling, NP on 11/23/2020 Stage prefix: Initial diagnosis   12/02/2020 Imaging   MRI Liver  IMPRESSION: Tiny benign hemangioma in the right hepatic lobe, which corresponds to the lesion seen on recent CT. No evidence of metastatic disease within the liver or abdomen.   12/05/2020 -  Radiation Therapy   Concurrent chemoradiation with Dr Cesar Herrera    12/05/2020 -  Chemotherapy   Concurrent chemoradiation with Xeloda 2000mg   in the AM and 1500mg  in the PM M-F on days of Radiation      CURRENT THERAPY:  Concurrent chemoradiation with Xeloda 2000mg  in the AM and 1500mg  in the PM M-F on days of Radiation starting 12/05/20.   INTERVAL HISTORY:  Cesar Herrera is here for a follow up of rectal cancer. He was last seen by me on 12/05/20. He presents to the clinic with his wife. He notes his  radiation is going well. He denies skin irrigations so far. He did have constipation and dehydration earlier this week and saw PA Cesar Herrera on 4/18 for IV Fluids. He has start stool softener which has helped. He is still able to work, mostly from home and less in office. He denies fever or chills. He notes his appetite is adequate.    REVIEW OF SYSTEMS:   Constitutional: Denies fevers, chills or abnormal weight loss Eyes: Denies blurriness of vision Ears, nose, mouth, throat, and face: Denies mucositis or sore throat Respiratory: Denies cough, dyspnea or wheezes Cardiovascular: Denies palpitation, chest discomfort or lower extremity swelling Gastrointestinal:  Denies nausea, heartburn or change in bowel habits Skin: Denies abnormal skin rashes Lymphatics: Denies new lymphadenopathy or easy bruising Neurological:Denies numbness, tingling or new weaknesses Behavioral/Psych: Mood is stable, no new changes  All other systems were reviewed with the patient and are negative.  MEDICAL HISTORY:  Past Medical History:  Diagnosis Date  . Cancer (Cesar Herrera)   . Family history of bladder cancer   . Family history of lymphoma   . H/O epididymitis 2011  . Seasonal allergies     SURGICAL HISTORY: Past Surgical History:  Procedure Laterality Date  . WISDOM TOOTH EXTRACTION      I have reviewed the social history and family history with the patient and they are unchanged from previous note.  ALLERGIES:  is allergic to erythromycin and penicillins.  MEDICATIONS:  Current Outpatient Medications  Medication Sig Dispense Refill  . ALPRAZolam (XANAX) 0.25 MG tablet Take 1 tablet (0.25 mg total) by mouth 2 (two) times daily as needed for anxiety. 30 tablet 0  . capecitabine (XELODA) 500 MG tablet TAKE 4 TABS (2000 MG) IN THE MORNING AND 3 TABS (1500 MG) IN THE EVENING ON DAYS OF RADIATION ONLY, MONDAY THROUGH FRIDAY. TAKE AFTER A MEAL 105 tablet 0  . Multiple Vitamins-Minerals (MULTIVITAMIN) tablet Take 1  tablet by mouth daily.     No current facility-administered medications for this visit.    PHYSICAL EXAMINATION: ECOG PERFORMANCE STATUS: 1 - Symptomatic but completely ambulatory  Vitals:   12/23/20 1458  BP: 116/72  Pulse: 66  Resp: 20  Temp: (!) 97 F (36.1 C)  SpO2: 97%   Filed Weights   12/23/20 1458  Weight: 179 lb 6.4 oz (81.4 kg)    Due to COVID19 we will limit examination to appearance. Patient had no complaints.  GENERAL:alert, no distress and comfortable SKIN: skin color normal, no rashes or significant lesions EYES: normal, Conjunctiva are pink and non-injected, sclera clear  NEURO: alert & oriented x 3 with fluent speech  LABORATORY DATA:  I have reviewed the data as listed CBC Latest Ref Rng & Units 12/23/2020 12/12/2020 12/05/2020  WBC 4.0 - 10.5 K/uL 5.0 3.6(L) 6.2  Hemoglobin 13.0 - 17.0 g/dL 14.5 13.8 14.0  Hematocrit 39.0 - 52.0 % 42.1 40.4 41.9  Platelets 150 - 400 K/uL 166 195 222     CMP Latest Ref Rng & Units 12/23/2020 12/12/2020 12/05/2020  Glucose 70 - 99  mg/dL 97 118(H) 114(H)  BUN 6 - 20 mg/dL 10 11 14   Creatinine 0.61 - 1.24 mg/dL 1.00 1.01 1.32(H)  Sodium 135 - 145 mmol/L 140 139 140  Potassium 3.5 - 5.1 mmol/L 4.2 4.0 4.4  Chloride 98 - 111 mmol/L 102 104 105  CO2 22 - 32 mmol/L 28 26 24   Calcium 8.9 - 10.3 mg/dL 9.5 8.9 8.9  Total Protein 6.5 - 8.1 g/dL 7.1 6.6 6.9  Total Bilirubin 0.3 - 1.2 mg/dL 0.5 0.3 0.4  Alkaline Phos 38 - 126 U/L 65 59 67  AST 15 - 41 U/L 16 18 20   ALT 0 - 44 U/L 19 22 22       RADIOGRAPHIC STUDIES: I have personally reviewed the radiological images as listed and agreed with the findings in the report. No results found.   ASSESSMENT & PLAN:  Cesar Herrera is a 50 y.o. male with    1.Adenocarcinoma of the rectum, cT3N0/N1,stage II or III -He was diagnosed in 11/2020 by colonoscopy which showed upper/mid rectal adenocarcinoma.  -Staging work-up shows 1.4 hypodense lesion in the right lobe, otherwise no  evidence of distant metastatic disease. Liver MRI showed benign hemangioma in right lobe of liver.  -Local staging pelvic MRI shows T3N1 disease, however the node is a single 5 mm mesorectal lymph node and could be reactive,could be N0. I spoke with radiology about this  -I started him on standard treatment with neoadjuvant concurrent chemo/RT with Xeloda 2000mg  in the AM and 1500mg  in the PM M-F on days of Radiation beginning on 12/05/20. This will be followed bycurative surgery,then 4 months adjuvant chemo based on final surgical path.If he has significant residual disease T4 or node positive will recommend Xeloda/Capox/FOLFOX. He agrees with plan.  -He has seen local surgeon Dr. Leighton Ruff and has second opinion with Dr. Morton Stall at [facility].  -He is tolerating CCRT with constipation and dehydration and pain with BMs that improved with senakot-S and IV Fluids. I reviewed management with him.  -Labs reviewed, CBC And CMP WNL except Lymphocyte count 0.6. Will continue Xeloda 2000mg  in the AM and 1500mg  in the PM M-F and continue Radiation daily. I recommend he watch for skin irritation and breakdown as he continues treatment.  -Lab and F/u on 5/2 and 5/9  2. Genetics  -Hequalifies for genetics due to his young age, he is interested and agreeable. A referral was placed today. -His daughters are ages 93 and 99. We briefly discussed initiating their screenings when they reached the age of 19 which is 10 years prior to his diagnosis -If he is found to have genetic mutation, we would recommend for his children (and ? Siblings) to get tested as appropriate   3. Diarrhea, Neurological symptoms with ache, secondary to #1, Constipation  -He notes having numbness and tingling of his inner upper thigh, groin and around genitals. This may ache but no true pain, no change in urination.  -He has become constipation on CCRT. He will continue to drink more water and use Senakot-S.     PLAN: -Continue Xeloda 2000mg  in the AM and 1500mg  in the PM M-F  -Continue Radiation  -Labs and F/u on 5/2 and 5/9    No problem-specific Assessment & Plan notes found for this encounter.   No orders of the defined types were placed in this encounter.  All questions were answered. The patient knows to call the clinic with any problems, questions or concerns. No barriers to learning was  detected. The total time spent in the appointment was 25 minutes.     Cesar Merle, MD 12/23/2020   I, Joslyn Devon, am acting as scribe for Cesar Merle, MD.   I have reviewed the above documentation for accuracy and completeness, and I agree with the above.

## 2020-12-21 NOTE — Progress Notes (Signed)
These results were reviewed with the patient.

## 2020-12-21 NOTE — Progress Notes (Signed)
Symptoms Management Clinic Progress Note   Cesar Herrera [id number] [birth year] 50 y.o.  Cesar Herrera is managed by Dr. [name]  Actively treated with chemotherapy/immunotherapy/hormonal therapy: yes  Current therapy: Concurrent capecitabine and radiation  Next scheduled appointment with provider: 12/23/2020  Assessment: Plan:    Generalized abdominal pain  Rectal adenocarcinoma ([place])   Generalized abdominal pain believed secondary to constipation: The patient was given an educational sheet and was instructed to begin using senna S1-2 p.o. twice daily MiraLAX 17 g up to twice daily as needed.  He was told to push fluids.  He was offered IV fluids today but declines.  He was told he could return for IV fluids as needed.  An abdominal x-ray was completed which returned showing:  FINDINGS: The bowel gas pattern is normal. There is no evidence of free air. No radio-opaque calculi or other significant radiographic abnormality is seen.  IMPRESSION: Negative abdominal radiographs.    Stage IIIb rectal cancer: [name] continues to be managed by Dr. [name] and is currently treated with concurrent capecitabine and radiation.  He is scheduled to be seen in follow-up on 12/23/2020.   Please see After Visit Summary for patient specific instructions.  Future Appointments  Date Time Provider [place]  12/22/2020  1:45 PM [facility] LIN[facility] 4 [facility] None  12/23/2020  1:15 PM [facility] LAB [facility] None  12/23/2020  1:45 PM [facility] LINAC 4 [facility] None  12/23/2020  2:20 PM [name], MD [facility] None  12/26/2020  1:45 PM [facility] LINAC 4 [facility] None  12/27/2020  1:15 PM [facility] LAB [facility] None  12/27/2020  1:45 PM [facility] LINAC 4 [facility] None  12/28/2020  1:45 PM [facility] LINAC 4 [facility] None  12/29/2020  1:45 PM [facility] LINAC 4 [facility] None  12/30/2020  1:45 PM [facility] LINAC 4 [facility] None  01/02/2021  1:15  PM [facility] LAB [facility] None  01/02/2021  1:45 PM [facility] LINAC 4 [facility] None  01/02/2021  2:20 PM [name], MD [facility] None  01/03/2021  2:00 PM [facility] LINAC 4 [facility] None  01/04/2021  2:00 PM [facility] LINAC 4 [facility] None  01/05/2021  2:00 PM [facility] LINAC 4 [facility] None  01/06/2021  2:00 PM [facility] LINAC 4 [facility] None  01/09/2021  1:30 PM [facility] LAB [facility] None  01/09/2021  2:00 PM Kyung Rudd, MD [facility] None  01/10/2021  2:00 PM [facility] LINAC 4 [facility] None  01/11/2021  2:00 PM [facility] LINAC 4 [facility] None    No orders of the defined types were placed in this encounter.      Subjective:   Patient ID:  KYSHAWN Herrera is a 50 y.o. (DOB 1970/11/05) male.  Chief Complaint: No chief complaint on file.   HPI Cesar Herrera  is a 50 y.o. male with a diagnosis of a stage IIIb rectal cancer.  He is followed by Dr. [name] and is currently treated with concurrent capecitabine and radiation.  He presents to the clinic today with report of constipation.  He had a bowel movement: 12/18/2020 but had lots of pain only produced a small bowel movement.  He has begun Colace as of today.  He reports that his bowel movements have been soft.  He notes that his bowel movements have been flat secondary to extrinsic compression of his bowels.  He denies bleeding, nausea, vomiting, fevers, chills, or sweats.  [name] was referred for an abdominal x-ray today which returned showing:  FINDINGS: The bowel gas pattern  is normal. There is no evidence of free air. No radio-opaque calculi or other significant radiographic abnormality is seen.  IMPRESSION: Negative abdominal radiographs.  Medications: I have reviewed the patient's current medications.  Allergies:  Allergies  Allergen Reactions  . Erythromycin     ? Reaction as child  . Penicillins     ? Reaction as child    Past Medical History:  Diagnosis Date  . Cancer  ([place])   . Family history of bladder cancer   . Family history of lymphoma   . H/O epididymitis 2011  . Seasonal allergies     Past Surgical History:  Procedure Laterality Date  . WISDOM TOOTH EXTRACTION      Family History  Problem Relation Age of Onset  . Diabetes Maternal Uncle   . Diabetes Father   . Colon polyps Father        1 polyp  . Stroke Other        PG aunts & PG uncles  . Heart disease Paternal Grandfather   . Bladder Cancer Paternal Aunt   . Lymphoma Nephew 15  . Other Nephew 2       kidney transplant  . Cancer Neg Hx   . Colon cancer Neg Hx   . Stomach cancer Neg Hx   . Esophageal cancer Neg Hx     Social History   Socioeconomic History  . Marital status: Married    Spouse name: Not on file  . Number of children: Not on file  . Years of education: Not on file  . Highest education level: Not on file  Occupational History  . Not on file  Tobacco Use  . Smoking status: Former Smoker    Types: Cigarettes    Quit date: 07/29/2003    Years since quitting: 17.4  . Smokeless tobacco: Never Used  . Tobacco comment: smoked 1993-2004, up to < 4 cigarettes/ day (socially)  Vaping Use  . Vaping Use: Never used  Substance and Sexual Activity  . Alcohol use: Yes    Comment: 1-2 drinks , wine or beer  . Drug use: No  . Sexual activity: Yes  Other Topics Concern  . Not on file  Social History Narrative   Exercise: mountain biking, hiking, walking - tends to exercise in spurts, does some yoga   Social Determinants of Health   Financial Resource Strain: Not on file  Food Insecurity: Not on file  Transportation Needs: Not on file  Physical Activity: Not on file  Stress: Not on file  Social Connections: Not on file  Intimate Partner Violence: Not on file    Past Medical History, Surgical history, Social history, and Family history were reviewed and updated as appropriate.   Please see review of systems for further details on the patient's review from  today.   Review of Systems:  Review of Systems  Constitutional: Negative for appetite change, chills, diaphoresis, fever and unexpected weight change.  HENT: Negative for trouble swallowing and voice change.   Respiratory: Negative for cough, chest tightness, shortness of breath and wheezing.   Cardiovascular: Negative for chest pain and palpitations.  Gastrointestinal: Positive for abdominal pain and constipation. Negative for abdominal distention, anal bleeding, blood in stool, diarrhea, nausea, rectal pain and vomiting.  Musculoskeletal: Negative for back pain and myalgias.  Neurological: Negative for dizziness, light-headedness and headaches.    Objective:   Physical Exam:  BP 115/78 (BP Location: Right Arm, Patient Position: Sitting)   Pulse 66  Temp (!) 97.1 F (36.2 C) (Tympanic)   Resp 18   Ht 5\' 7"  (1.702 m)   Wt 180 lb 6.4 oz (81.8 kg)   SpO2 99%   BMI 28.25 kg/m  ECOG: 0  Physical Exam Constitutional:      General: He is not in acute distress.    Appearance: He is not diaphoretic.  HENT:     Head: Normocephalic and atraumatic.  Eyes:     General: No scleral icterus.       Right eye: No discharge.        Left eye: No discharge.     Conjunctiva/sclera: Conjunctivae normal.  Cardiovascular:     Rate and Rhythm: Normal rate and regular rhythm.     Heart sounds: Normal heart sounds. No murmur heard. No friction rub. No gallop.   Pulmonary:     Effort: Pulmonary effort is normal. No respiratory distress.     Breath sounds: Normal breath sounds. No wheezing or rales.  Abdominal:     General: Bowel sounds are normal. There is no distension.     Palpations: Abdomen is soft. There is no mass.     Tenderness: There is generalized abdominal tenderness. There is no guarding or rebound.    Skin:    General: Skin is warm and dry.  Neurological:     Mental Status: He is alert.     Coordination: Coordination normal.     Gait: Gait normal.  Psychiatric:         Mood and Affect: Mood normal.        Behavior: Behavior normal.        Thought Content: Thought content normal.        Judgment: Judgment normal.     Lab Review:     Component Value Date/Time   NA 139 12/12/2020 1414   K 4.0 12/12/2020 1414   CL 104 12/12/2020 1414   CO2 26 12/12/2020 1414   GLUCOSE 118 (H) 12/12/2020 1414   BUN 11 12/12/2020 1414   CREATININE 1.01 12/12/2020 1414   CALCIUM 8.9 12/12/2020 1414   PROT 6.6 12/12/2020 1414   ALBUMIN 4.0 12/12/2020 1414   AST 18 12/12/2020 1414   ALT 22 12/12/2020 1414   ALKPHOS 59 12/12/2020 1414   BILITOT 0.3 12/12/2020 1414   GFRNONAA >60 12/12/2020 1414   GFRAA 123 04/30/2008 1125       Component Value Date/Time   WBC 3.6 (L) 12/12/2020 1414   WBC 5.3 09/09/2019 1052   RBC 4.65 12/12/2020 1414   HGB 13.8 12/12/2020 1414   HCT 40.4 12/12/2020 1414   PLT 195 12/12/2020 1414   MCV 86.9 12/12/2020 1414   MCH 29.7 12/12/2020 1414   MCHC 34.2 12/12/2020 1414   RDW 11.9 12/12/2020 1414   LYMPHSABS 0.9 12/12/2020 1414   MONOABS 0.3 12/12/2020 1414   EOSABS 0.2 12/12/2020 1414   BASOSABS 0.0 12/12/2020 1414   -------------------------------  Imaging from last 24 hours (if applicable):  Radiology interpretation: MR LIVER W WO CONTRAST  Result Date: 12/02/2020 CLINICAL DATA:  Rectal carcinoma. Indeterminate liver lesion on recent CT. EXAM: MRI ABDOMEN WITHOUT AND WITH CONTRAST TECHNIQUE: Multiplanar multisequence MR imaging of the abdomen was performed both before and after the administration of intravenous contrast. CONTRAST:  71mL GADAVIST GADOBUTROL 1 MMOL/ML IV SOLN COMPARISON:  CT on 11/17/2020 FINDINGS: Lower chest: No acute findings. Hepatobiliary: 8 mm T2 hyperintense lesion is seen in the right hepatic lobe, which corresponds to the lesion  seen on recent CT. This shows nodular peripheral contrast enhancement on dynamic imaging, consistent with a benign hemangioma. No other hepatic masses are identified. Gallbladder is  unremarkable. No evidence of biliary ductal dilatation. Pancreas:  No mass or inflammatory changes. Spleen:  Within normal limits in size and appearance. Adrenals/Urinary Tract: No masses identified. No evidence of hydronephrosis. Stomach/Bowel: Visualized portion unremarkable. Vascular/Lymphatic: No pathologically enlarged lymph nodes identified. No abdominal aortic aneurysm. Other:  None. Musculoskeletal:  No suspicious bone lesions identified. IMPRESSION: Tiny benign hemangioma in the right hepatic lobe, which corresponds to the lesion seen on recent CT. No evidence of metastatic disease within the liver or abdomen. Electronically Signed   By: Marlaine Hind M.D.   On: 12/02/2020 12:32   DG Abd 2 Views  Result Date: 12/19/2020 CLINICAL DATA:  Rectal cancer.  Abdominal bloating and severe pain. EXAM: ABDOMEN - 2 VIEW COMPARISON:  None. FINDINGS: The bowel gas pattern is normal. There is no evidence of free air. No radio-opaque calculi or other significant radiographic abnormality is seen. IMPRESSION: Negative abdominal radiographs. Electronically Signed   By: San Morelle M.D.   On: 12/19/2020 13:18

## 2020-12-22 ENCOUNTER — Ambulatory Visit
Admission: RE | Admit: 2020-12-22 | Discharge: 2020-12-22 | Disposition: A | Discharge status: Home / Self Care | Payer: 59 | Source: Ambulatory Visit | Attending: Radiation Oncology | Admitting: Radiation Oncology

## 2020-12-22 DIAGNOSIS — C20 Malignant neoplasm of rectum: Secondary | ICD-10-CM | POA: Diagnosis not present

## 2020-12-23 ENCOUNTER — Inpatient Hospital Stay: Payer: 59 | Admitting: Hematology

## 2020-12-23 ENCOUNTER — Inpatient Hospital Stay: Payer: 59

## 2020-12-23 ENCOUNTER — Other Ambulatory Visit: Payer: Self-pay

## 2020-12-23 ENCOUNTER — Other Ambulatory Visit: Payer: 59

## 2020-12-23 ENCOUNTER — Ambulatory Visit
Admission: RE | Admit: 2020-12-23 | Discharge: 2020-12-23 | Disposition: A | Discharge status: Home / Self Care | Payer: 59 | Source: Ambulatory Visit | Attending: Radiation Oncology | Admitting: Radiation Oncology

## 2020-12-23 VITALS — BP 116/72 | HR 66 | Temp 97.0°F | Resp 20 | Ht 67.0 in | Wt 179.4 lb

## 2020-12-23 DIAGNOSIS — C20 Malignant neoplasm of rectum: Secondary | ICD-10-CM

## 2020-12-23 DIAGNOSIS — Z1379 Encounter for other screening for genetic and chromosomal anomalies: Secondary | ICD-10-CM | POA: Insufficient documentation

## 2020-12-23 LAB — CBC WITH DIFFERENTIAL (CANCER CENTER ONLY)
Abs Immature Granulocytes: 0.01 10*3/uL (ref 0.00–0.07)
Basophils Absolute: 0 10*3/uL (ref 0.0–0.1)
Basophils Relative: 0 %
Eosinophils Absolute: 0.2 10*3/uL (ref 0.0–0.5)
Eosinophils Relative: 3 %
HCT: 42.1 % (ref 39.0–52.0)
Hemoglobin: 14.5 g/dL (ref 13.0–17.0)
Immature Granulocytes: 0 %
Lymphocytes Relative: 13 %
Lymphs Abs: 0.6 10*3/uL — ABNORMAL LOW (ref 0.7–4.0)
MCH: 29.8 pg (ref 26.0–34.0)
MCHC: 34.4 g/dL (ref 30.0–36.0)
MCV: 86.6 fL (ref 80.0–100.0)
Monocytes Absolute: 0.4 10*3/uL (ref 0.1–1.0)
Monocytes Relative: 9 %
Neutro Abs: 3.7 10*3/uL (ref 1.7–7.7)
Neutrophils Relative %: 75 %
Platelet Count: 166 10*3/uL (ref 150–400)
RBC: 4.86 MIL/uL (ref 4.22–5.81)
RDW: 12.8 % (ref 11.5–15.5)
WBC Count: 5 10*3/uL (ref 4.0–10.5)
nRBC: 0 % (ref 0.0–0.2)

## 2020-12-23 LAB — CMP (CANCER CENTER ONLY)
ALT: 19 U/L (ref 0–44)
AST: 16 U/L (ref 15–41)
Albumin: 4.1 g/dL (ref 3.5–5.0)
Alkaline Phosphatase: 65 U/L (ref 38–126)
Anion gap: 10 (ref 5–15)
BUN: 10 mg/dL (ref 6–20)
CO2: 28 mmol/L (ref 22–32)
Calcium: 9.5 mg/dL (ref 8.9–10.3)
Chloride: 102 mmol/L (ref 98–111)
Creatinine: 1 mg/dL (ref 0.61–1.24)
GFR, Estimated: >60 mL/min (ref >60)
Glucose, Bld: 97 mg/dL (ref 70–99)
Potassium: 4.2 mmol/L (ref 3.5–5.1)
Sodium: 140 mmol/L (ref 135–145)
Total Bilirubin: 0.5 mg/dL (ref 0.3–1.2)
Total Protein: 7.1 g/dL (ref 6.5–8.1)

## 2020-12-25 ENCOUNTER — Encounter: Payer: Self-pay | Admitting: Hematology

## 2020-12-26 ENCOUNTER — Telehealth: Payer: Self-pay | Admitting: Hematology

## 2020-12-26 ENCOUNTER — Other Ambulatory Visit: Payer: Self-pay

## 2020-12-26 ENCOUNTER — Ambulatory Visit
Admission: RE | Admit: 2020-12-26 | Discharge: 2020-12-26 | Disposition: A | Discharge status: Home / Self Care | Payer: 59 | Source: Ambulatory Visit | Attending: Radiation Oncology | Admitting: Radiation Oncology

## 2020-12-26 DIAGNOSIS — C20 Malignant neoplasm of rectum: Secondary | ICD-10-CM | POA: Diagnosis not present

## 2020-12-26 NOTE — Telephone Encounter (Signed)
Left message with follow-up appointment per 4/22 los. Gave option to call back to reschedule if needed. 

## 2020-12-27 ENCOUNTER — Other Ambulatory Visit: Payer: 59

## 2020-12-27 ENCOUNTER — Ambulatory Visit: Payer: Self-pay | Admitting: Genetic Counselor

## 2020-12-27 ENCOUNTER — Ambulatory Visit
Admission: RE | Admit: 2020-12-27 | Discharge: 2020-12-27 | Disposition: A | Discharge status: Home / Self Care | Payer: 59 | Source: Ambulatory Visit | Attending: Radiation Oncology | Admitting: Radiation Oncology

## 2020-12-27 ENCOUNTER — Inpatient Hospital Stay: Payer: 59

## 2020-12-27 ENCOUNTER — Encounter: Payer: Self-pay | Admitting: Genetic Counselor

## 2020-12-27 ENCOUNTER — Telehealth: Payer: Self-pay | Admitting: Genetic Counselor

## 2020-12-27 ENCOUNTER — Telehealth: Payer: Self-pay | Admitting: Hematology

## 2020-12-27 DIAGNOSIS — Z1379 Encounter for other screening for genetic and chromosomal anomalies: Secondary | ICD-10-CM

## 2020-12-27 DIAGNOSIS — C20 Malignant neoplasm of rectum: Secondary | ICD-10-CM | POA: Diagnosis not present

## 2020-12-27 NOTE — Telephone Encounter (Signed)
Revealed negative genetic testing. Discussed that we do not know why he has rectal cancer or why there is cancer in the family. It is possible that there could be a mutation in a different gene that we are not testing, or our current technology may not be able to detect certain mutations. It will therefore be important for him to stay in contact with genetics to keep up with whether additional testing may be appropriate in the future.   A variant of uncertain significance was detected in the DICER1 gene called  p.V1260I (c.[id number]>A). His result is still considered normal at this time and should not impact his medical management.

## 2020-12-27 NOTE — Telephone Encounter (Signed)
Left message with rescheduled upcoming appointment due to provider's PAL. Gave option to call back to reschedule if needed. 

## 2020-12-27 NOTE — Progress Notes (Signed)
HPI:  [name] was previously seen in the [facility] clinic due to a personal and family history of cancer and concerns regarding a hereditary predisposition to cancer. Please refer to our prior cancer genetics clinic note for more information regarding our discussion, assessment and recommendations, at the time. [name] recent genetic test results were disclosed to him, as were recommendations warranted by these results. These results and recommendations are discussed in more detail below.  CANCER HISTORY:  Oncology History Overview Note  Cancer Staging Rectal adenocarcinoma [facility]) Staging form: Colon and Rectum, AJCC 8th Edition - Clinical stage from 11/23/2020: Stage IIIB (cT3, cN1, cM0) - Signed by Cesar Feeling, NP on 11/23/2020 Stage prefix: Initial diagnosis    Rectal adenocarcinoma ([place])  11/11/2020 Procedure   Colonoscopy by Dr. Ardis Herrera Impression - Three 2 to 5 mm polyps in the descending colon, in the transverse colon and in the cecum, removed with a cold snare. Complete resection. 2/3 polyps retrieved, sent to pathology. - Rectal tumor that appears malignant, distal edge 7cm from the anal verge. This was biopsied and then labeled with Spot submucosal tattoo. - The examination was otherwise normal on direct and retroflexion views.   11/11/2020 Initial Biopsy   Diagnosis 1. Descending Colon Polyp - TUBULAR ADENOMA (3 OF 3 FRAGMENTS) - NO HIGH-GRADE DYSPLASIA OR MALIGNANCY IDENTIFIED 2. Rectum, biopsy - ADENOCARCINOMA, AT LEAST INTRAMUCOSAL   11/11/2020 Tumor Marker   CEA: 1.4 CA 19-9: 10   11/15/2020 Imaging   Pelvic MRI for local staging IMPRESSION: Signs of potential T3 B disease, tortuosity of the rectum at the level of the tumor makes assessment difficult, distorted by the polypoid eccentric tumor in the high rectum as described.   N1 disease.   No extramural venous invasion. Tumor extends just to the level of the APR in this center below this  level.   11/17/2020 Imaging   CT CAP IMPRESSION: 1. Known rectal mass with a 5 mm in short axis left mesorectal lymph node better shown on the prior exam and only faintly apparent on the CT. 2. 1.4 by 1.2 by 0.9 cm hypodense lesion in the right hepatic lobe on image 50 of series 2, poorly seen on the delayed images. This lesion is indeterminate by CT and could be benign or malignant. Imaging possibilities for with further workup might include hepatic protocol MRI with and without contrast or PET-CT. 3. Lucent lesion of the left posterior L2 vertebral body potentially extending slightly into the pedicle, measuring 1.9 by 1.4 cm on image 116 of series 5. This previously measured 1.6 by 1.3 cm on 03/28/2010, accordingly making it unlikely to be a metastatic lesion. 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 4. Small right middle lobe and right lower lobe pulmonary nodules are likely benign but merit surveillance in this context.   11/18/2020 Initial Diagnosis   Rectal adenocarcinoma ([place])   11/23/2020 Cancer Staging   Staging form: Colon and Rectum, AJCC 8th Edition - Clinical stage from 11/23/2020: Stage IIIB (cT3, cN1, cM0) - Signed by Cesar Feeling, NP on 11/23/2020 Stage prefix: Initial diagnosis   12/02/2020 Imaging   MRI Liver  IMPRESSION: Tiny benign hemangioma in the right hepatic lobe, which corresponds to the lesion seen on recent CT. No evidence of metastatic disease within the liver or abdomen.   12/05/2020 -  Radiation Therapy   Concurrent chemoradiation with Dr Cesar Herrera    12/05/2020 -  Chemotherapy   Concurrent chemoradiation with Xeloda 2044m in the AM and 15060min the PM M-F on days of Radiation   12/23/2020 Genetic Testing   Negative genetic testing:  No pathogenic variants detected on the [employer] CancerNext-Expanded + RNAinsight panel. 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 The report date is 12/23/2020.  The CancerNext-Expanded + RNAinsight gene panel offered by [employer] includes sequencing and rearrangement analysis for the following 77 genes: AIP, ALK, APC, ATM, AXIN2, BAP1, BARD1, BLM, BMPR1A, BRCA1, BRCA2, BRIP1, CDC73, CDH1, CDK4, CDKN1B, CDKN2A, CHEK2, CTNNA1, DICER1, FANCC, FH, FLCN, GALNT12, KIF1B, LZTR1, MAX, MEN1, MET, MLH1, MSH2, MSH3, MSH6, MUTYH, NBN, NF1, NF2, NTHL1, PALB2, PHOX2B, PMS2, POT1, PRKAR1A, PTCH1, PTEN, RAD51C, RAD51D, RB1, RECQL, RET, SDHA, SDHAF2, SDHB, SDHC, SDHD, SMAD4, SMARCA4, SMARCB1, SMARCE1, STK11, SUFU, TMEM127, TP53, TSC1, TSC2, VHL and XRCC2 (sequencing and deletion/duplication); EGFR, EGLN1, HOXB13, KIT, MITF, PDGFRA, POLD1 and POLE (sequencing only); EPCAM and GREM1 (deletion/duplication only). RNA data is routinely analyzed for use in variant interpretation for all genes.      FAMILY HISTORY:  We obtained a detailed, 4-generation family history.  Significant diagnoses are listed below: Family History  Problem Relation Age of Onset  . Diabetes Maternal Uncle   . Diabetes Father   . Colon polyps Father        1 polyp  . Stroke Other        PG aunts & PG uncles  . Heart disease Paternal Grandfather   . Bladder Cancer Paternal Aunt   . Lymphoma Nephew 15  . Other Nephew 2       kidney transplant  . Cancer Neg Hx   . Colon cancer Neg Hx   . Stomach cancer Neg Hx   . Esophageal cancer Neg Hx     [name] two daughters (ages 1523nd 1391 He has one brother (age 7283who has four children. One of the nephews was diagnosed with lymphoma at age 4398which was thought to be related to post-kidney transplant medication.  [name] is alive at age 6123ithout cancer. There was one maternal uncle who did not have cancer. There is no known cancer among maternal cousins. [name] died in her 8084sithout cancer. His maternal grandfather died in his early 304sithout  cancer. There is a maternal great-uncle who had GI problems, although [name] not know if it was cancer.  [name] is alive at age 5769ithout cancer. There is one paternal aunt who has a history of bladder cancer. There is no known cancer among paternal cousins. Mr. IvGolebiewskiaternal Herrera died at age 3245ithout cancer. His paternal grandfather died at age 8241ithout cancer.   [name] unaware of previous family history of genetic testing for hereditary cancer risks. Patient's maternal ancestors are of [place], and paternal ancestors are of [place]. There is no reported Ashkenazi Jewish ancestry. There is no known consanguinity.  GENETIC TEST RESULTS: Genetic testing reported out on 12/23/2020 through the [employer] RNAinsight panel. No pathogenic variants were detected.   The CancerNext-Expanded + RNAinsight gene panel offered by [employer] includes sequencing and rearrangement analysis for the following 77 genes: AIP, ALK, APC, ATM, AXIN2, BAP1, BARD1, BLM, BMPR1A, BRCA1, BRCA2, BRIP1, CDC73, CDH1, CDK4, CDKN1B, CDKN2A, CHEK2, CTNNA1, DICER1, FANCC, FH, FLCN, GALNT12, KIF1B, LZTR1, MAX, MEN1, MET, MLH1, MSH2, MSH3,  MSH6, MUTYH, NBN, NF1, NF2, NTHL1, PALB2, PHOX2B, PMS2, POT1, PRKAR1A, PTCH1, PTEN, RAD51C, RAD51D, RB1, RECQL, RET, SDHA, SDHAF2, SDHB, SDHC, SDHD, SMAD4, SMARCA4, SMARCB1, SMARCE1, STK11, SUFU, TMEM127, TP53, TSC1, TSC2, VHL and XRCC2 (sequencing and deletion/duplication); EGFR, EGLN1, HOXB13, KIT, MITF, PDGFRA, POLD1 and POLE (sequencing only); EPCAM and GREM1 (deletion/duplication only). RNA data is routinely analyzed for use in variant interpretation for all genes. The test report will be scanned into EPIC and located under the Molecular Pathology section of the Results Review tab.  A portion of the result report is included below for reference.     We discussed with Mr. Bissonnette that because current genetic  testing is not perfect, it is possible there may be a gene mutation in one of these genes that current testing cannot detect, but that chance is small.  We also discussed that there could be another gene that has not yet been discovered, or that we have not yet tested, that is responsible for the cancer diagnoses in the family. It is also possible there is a hereditary cause for the cancer in the family that Mr. Keating did not inherit and therefore was not identified in his testing.  Therefore, it is important to remain in touch with cancer genetics in the future so that we can continue to offer Mr. Jaime the most up to date genetic testing.   Genetic testing did identify a variant of uncertain significance (VUS) in the DICER1 gene called p.V1260I (c.3778G>A).  At this time, it is unknown if this variant is associated with increased cancer risk or if this is a normal finding, but most variants such as this get reclassified to being inconsequential. It should not be used to make medical management decisions. With time, we suspect the lab will determine the significance of this variant, if any. If we do learn more about it, we will try to contact Mr. Uemura to discuss it further. However, it is important to stay in touch with [place] periodically and keep the address and phone number up to date.  ADDITIONAL GENETIC TESTING: We discussed with Mr. Salehi that his genetic testing was fairly extensive.  If there are genes identified to increase cancer risk that can be analyzed in the future, we would be happy to discuss and coordinate this testing at that time.    CANCER SCREENING RECOMMENDATIONS: Mr. Lumadue test result is considered negative (normal).  This means that we have not identified a hereditary cause for his personal and family history of cancer at this time. Most cancers happen by chance and this negative test suggests that his personal and family history of cancer may fall into this category.    While reassuring,  this does not definitively rule out a hereditary predisposition to cancer. It is still possible that there could be genetic mutations that are undetectable by current technology. There could be genetic mutations in genes that have not been tested or identified to increase cancer risk.  Therefore, it is recommended he continue to follow the cancer management and screening guidelines provided by his oncology and primary healthcare provider.   An individual's cancer risk and medical management are not determined by genetic test results alone. Overall cancer risk assessment incorporates additional factors, including personal medical history, family history, and any available genetic information that may result in a personalized plan for cancer prevention and surveillance.  RECOMMENDATIONS FOR FAMILY MEMBERS:  Individuals in this family might be at some increased risk of developing cancer, over the general  population risk, simply due to the family history of cancer.  We recommended women in this family have a yearly mammogram beginning at age 96, or 57 years younger than the earliest onset of cancer, an annual clinical breast exam, and perform monthly breast self-exams. Women in this family should also have a gynecological exam as recommended by their primary provider. All family members should be referred for colonoscopy starting 10 years younger than the earliest diagnosis of colorectal cancer in the family.  FOLLOW-UP: Lastly, we discussed with Mr. Hemenway that cancer genetics is a rapidly advancing field and it is possible that new genetic tests will be appropriate for him and/or his family members in the future. We encouraged him to remain in contact with cancer genetics on an annual basis so we can update his personal and family histories and let him know of advances in cancer genetics that may benefit this family.   Our contact number was provided. Mr. Thivierge questions were answered to his satisfaction, and  he knows he is welcome to call us at anytime with additional questions or concerns.   Clint Guy, MS, [facility] Genetic Counselor [email or web address] Phone: [phone]

## 2020-12-28 ENCOUNTER — Ambulatory Visit
Admission: RE | Admit: 2020-12-28 | Discharge: 2020-12-28 | Disposition: A | Discharge status: Home / Self Care | Payer: 59 | Source: Ambulatory Visit | Attending: Radiation Oncology | Admitting: Radiation Oncology

## 2020-12-28 ENCOUNTER — Other Ambulatory Visit: Payer: Self-pay

## 2020-12-28 DIAGNOSIS — C20 Malignant neoplasm of rectum: Secondary | ICD-10-CM | POA: Diagnosis not present

## 2020-12-29 ENCOUNTER — Ambulatory Visit
Admission: RE | Admit: 2020-12-29 | Discharge: 2020-12-29 | Disposition: A | Discharge status: Home / Self Care | Payer: 59 | Source: Ambulatory Visit | Attending: Radiation Oncology | Admitting: Radiation Oncology

## 2020-12-29 DIAGNOSIS — C20 Malignant neoplasm of rectum: Secondary | ICD-10-CM | POA: Diagnosis not present

## 2020-12-30 ENCOUNTER — Ambulatory Visit
Admission: RE | Admit: 2020-12-30 | Discharge: 2020-12-30 | Disposition: A | Discharge status: Home / Self Care | Payer: 59 | Source: Ambulatory Visit | Attending: Radiation Oncology | Admitting: Radiation Oncology

## 2020-12-30 ENCOUNTER — Other Ambulatory Visit: Payer: Self-pay

## 2020-12-30 DIAGNOSIS — C20 Malignant neoplasm of rectum: Secondary | ICD-10-CM | POA: Diagnosis not present

## 2021-01-02 ENCOUNTER — Other Ambulatory Visit: Payer: Self-pay

## 2021-01-02 ENCOUNTER — Ambulatory Visit: Payer: 59 | Admitting: Hematology

## 2021-01-02 ENCOUNTER — Ambulatory Visit
Admission: RE | Admit: 2021-01-02 | Discharge: 2021-01-02 | Disposition: A | Discharge status: Home / Self Care | Payer: 59 | Source: Ambulatory Visit | Attending: Radiation Oncology | Admitting: Radiation Oncology

## 2021-01-02 ENCOUNTER — Other Ambulatory Visit: Payer: 59

## 2021-01-02 DIAGNOSIS — Z807 Family history of other malignant neoplasms of lymphoid, hematopoietic and related tissues: Secondary | ICD-10-CM | POA: Insufficient documentation

## 2021-01-02 DIAGNOSIS — Z8052 Family history of malignant neoplasm of bladder: Secondary | ICD-10-CM | POA: Insufficient documentation

## 2021-01-02 DIAGNOSIS — R3915 Urgency of urination: Secondary | ICD-10-CM | POA: Diagnosis not present

## 2021-01-02 DIAGNOSIS — R1084 Generalized abdominal pain: Secondary | ICD-10-CM | POA: Diagnosis not present

## 2021-01-02 DIAGNOSIS — R918 Other nonspecific abnormal finding of lung field: Secondary | ICD-10-CM | POA: Diagnosis not present

## 2021-01-02 DIAGNOSIS — C20 Malignant neoplasm of rectum: Secondary | ICD-10-CM | POA: Insufficient documentation

## 2021-01-03 ENCOUNTER — Inpatient Hospital Stay: Payer: 59 | Attending: Nurse Practitioner

## 2021-01-03 ENCOUNTER — Inpatient Hospital Stay: Payer: 59 | Admitting: Hematology

## 2021-01-03 ENCOUNTER — Encounter: Payer: Self-pay | Admitting: Hematology

## 2021-01-03 ENCOUNTER — Other Ambulatory Visit: Payer: Self-pay

## 2021-01-03 ENCOUNTER — Ambulatory Visit
Admission: RE | Admit: 2021-01-03 | Discharge: 2021-01-03 | Disposition: A | Discharge status: Home / Self Care | Payer: 59 | Source: Ambulatory Visit | Attending: Radiation Oncology | Admitting: Radiation Oncology

## 2021-01-03 VITALS — BP 118/74 | HR 71 | Temp 97.9°F | Resp 17 | Ht 67.0 in | Wt 179.8 lb

## 2021-01-03 DIAGNOSIS — C20 Malignant neoplasm of rectum: Secondary | ICD-10-CM

## 2021-01-03 DIAGNOSIS — Z807 Family history of other malignant neoplasms of lymphoid, hematopoietic and related tissues: Secondary | ICD-10-CM | POA: Insufficient documentation

## 2021-01-03 DIAGNOSIS — R3915 Urgency of urination: Secondary | ICD-10-CM | POA: Insufficient documentation

## 2021-01-03 DIAGNOSIS — R918 Other nonspecific abnormal finding of lung field: Secondary | ICD-10-CM | POA: Insufficient documentation

## 2021-01-03 DIAGNOSIS — R1084 Generalized abdominal pain: Secondary | ICD-10-CM | POA: Insufficient documentation

## 2021-01-03 DIAGNOSIS — Z8052 Family history of malignant neoplasm of bladder: Secondary | ICD-10-CM | POA: Insufficient documentation

## 2021-01-03 LAB — CBC WITH DIFFERENTIAL (CANCER CENTER ONLY)
Abs Immature Granulocytes: 0.01 10*3/uL (ref 0.00–0.07)
Basophils Absolute: 0 10*3/uL (ref 0.0–0.1)
Basophils Relative: 1 %
Eosinophils Absolute: 0.1 10*3/uL (ref 0.0–0.5)
Eosinophils Relative: 3 %
HCT: 39.3 % (ref 39.0–52.0)
Hemoglobin: 13.6 g/dL (ref 13.0–17.0)
Immature Granulocytes: 0 %
Lymphocytes Relative: 8 %
Lymphs Abs: 0.3 10*3/uL — ABNORMAL LOW (ref 0.7–4.0)
MCH: 30.3 pg (ref 26.0–34.0)
MCHC: 34.6 g/dL (ref 30.0–36.0)
MCV: 87.5 fL (ref 80.0–100.0)
Monocytes Absolute: 0.3 10*3/uL (ref 0.1–1.0)
Monocytes Relative: 8 %
Neutro Abs: 3.3 10*3/uL (ref 1.7–7.7)
Neutrophils Relative %: 80 %
Platelet Count: 161 10*3/uL (ref 150–400)
RBC: 4.49 MIL/uL (ref 4.22–5.81)
RDW: 14.2 % (ref 11.5–15.5)
WBC Count: 4.1 10*3/uL (ref 4.0–10.5)
nRBC: 0 % (ref 0.0–0.2)

## 2021-01-03 LAB — CMP (CANCER CENTER ONLY)
ALT: 16 U/L (ref 0–44)
AST: 17 U/L (ref 15–41)
Albumin: 4 g/dL (ref 3.5–5.0)
Alkaline Phosphatase: 61 U/L (ref 38–126)
Anion gap: 9 (ref 5–15)
BUN: 13 mg/dL (ref 6–20)
CO2: 28 mmol/L (ref 22–32)
Calcium: 9.1 mg/dL (ref 8.9–10.3)
Chloride: 105 mmol/L (ref 98–111)
Creatinine: 0.98 mg/dL (ref 0.61–1.24)
GFR, Estimated: >60 mL/min (ref >60)
Glucose, Bld: 102 mg/dL — ABNORMAL HIGH (ref 70–99)
Potassium: 4.1 mmol/L (ref 3.5–5.1)
Sodium: 142 mmol/L (ref 135–145)
Total Bilirubin: 0.4 mg/dL (ref 0.3–1.2)
Total Protein: 7 g/dL (ref 6.5–8.1)

## 2021-01-03 LAB — IRON AND TIBC
Iron: 95 ug/dL (ref 45–182)
Saturation Ratios: 22 % (ref 17.9–39.5)
TIBC: 425 ug/dL (ref 250–450)
UIBC: 330 ug/dL

## 2021-01-03 LAB — FERRITIN: Ferritin: 79 ng/mL (ref 24–336)

## 2021-01-03 NOTE — Progress Notes (Signed)
[place]   Telephone:(336) [phone] Fax:(336) [phone]   Clinic Follow up Note   Patient Care Team: [name], MD as PCP - General (Internal Medicine) [name], RN as Oncology Nurse Navigator [name], NP as Nurse Practitioner (Nurse Practitioner) [name], MD as Consulting Physician (Oncology)  Date of Service:  01/03/2021  CHIEF COMPLAINT: F/u of rectal cancer   SUMMARY OF ONCOLOGIC HISTORY: Oncology History Overview Note  Cancer Staging Rectal adenocarcinoma [facility]) Staging form: Colon and Rectum, AJCC 8th Edition - Clinical stage from 11/23/2020: Stage IIIB (cT3, cN1, cM0) - Signed by [name], NP on 11/23/2020 Stage prefix: Initial diagnosis    Rectal adenocarcinoma ([place])  11/11/2020 Procedure   Colonoscopy by Dr. Ardis Hughs Impression - Three 2 to 5 mm polyps in the descending colon, in the transverse colon and in the cecum, removed with a cold snare. Complete resection. 2/3 polyps retrieved, sent to pathology. - Rectal tumor that appears malignant, distal edge 7cm from the anal verge. This was biopsied and then labeled with Spot submucosal tattoo. - The examination was otherwise normal on direct and retroflexion views.   11/11/2020 Initial Biopsy   Diagnosis 1. Descending Colon Polyp - TUBULAR ADENOMA (3 OF 3 FRAGMENTS) - NO HIGH-GRADE DYSPLASIA OR MALIGNANCY IDENTIFIED 2. Rectum, biopsy - ADENOCARCINOMA, AT LEAST INTRAMUCOSAL   11/11/2020 Tumor Marker   CEA: 1.4 CA 19-9: 10   11/15/2020 Imaging   Pelvic MRI for local staging IMPRESSION: Signs of potential T3 B disease, tortuosity of the rectum at the level of the tumor makes assessment difficult, distorted by the polypoid eccentric tumor in the high rectum as described.   N1 disease.   No extramural venous invasion. Tumor extends just to the level of the APR in this center below this level.   11/17/2020 Imaging   CT CAP IMPRESSION: 1. Known rectal mass with a 5 mm in  short axis left mesorectal lymph node better shown on the prior exam and only faintly apparent on the CT. 2. 1.4 by 1.2 by 0.9 cm hypodense lesion in the right hepatic lobe on image 50 of series 2, poorly seen on the delayed images. This lesion is indeterminate by CT and could be benign or malignant. Imaging possibilities for with further workup might include hepatic protocol MRI with and without contrast or PET-CT. 3. Lucent lesion of the left posterior L2 vertebral body potentially extending slightly into the pedicle, measuring 1.9 by 1.4 cm on image 116 of series 5. This previously measured 1.6 by 1.3 cm on 03/28/2010, accordingly making it unlikely to be a metastatic lesion. 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 4. Small right middle lobe and right lower lobe pulmonary nodules are likely benign but merit surveillance in this context.   11/18/2020 Initial Diagnosis   Rectal adenocarcinoma ([place])   11/23/2020 Cancer Staging   Staging form: Colon and Rectum, AJCC 8th Edition - Clinical stage from 11/23/2020: Stage IIIB (cT3, cN1, cM0) - Signed by [name], NP on 11/23/2020 Stage prefix: Initial diagnosis   12/02/2020 Imaging   MRI Liver  IMPRESSION: Tiny benign hemangioma in the right hepatic lobe, which corresponds to the lesion seen on recent CT. No evidence of metastatic disease within the liver or abdomen.   12/05/2020 -  Radiation Therapy   Concurrent chemoradiation with Dr Lisbeth Renshaw    12/05/2020 -  Chemotherapy   Concurrent chemoradiation with Xeloda 2096m  in the AM and 1540m in the PM M-F on days of Radiation   12/23/2020 Genetic Testing   Negative genetic testing:  No pathogenic variants detected on the [facility] CancerNext-Expanded + RNAinsight panel. 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 The report date is 12/23/2020.  The CancerNext-Expanded + RNAinsight  gene panel offered by [employer] includes sequencing and rearrangement analysis for the following 77 genes: AIP, ALK, APC, ATM, AXIN2, BAP1, BARD1, BLM, BMPR1A, BRCA1, BRCA2, BRIP1, CDC73, CDH1, CDK4, CDKN1B, CDKN2A, CHEK2, CTNNA1, DICER1, FANCC, FH, FLCN, GALNT12, KIF1B, LZTR1, MAX, MEN1, MET, MLH1, MSH2, MSH3, MSH6, MUTYH, NBN, NF1, NF2, NTHL1, PALB2, PHOX2B, PMS2, POT1, PRKAR1A, PTCH1, PTEN, RAD51C, RAD51D, RB1, RECQL, RET, SDHA, SDHAF2, SDHB, SDHC, SDHD, SMAD4, SMARCA4, SMARCB1, SMARCE1, STK11, SUFU, TMEM127, TP53, TSC1, TSC2, VHL and XRCC2 (sequencing and deletion/duplication); EGFR, EGLN1, HOXB13, KIT, MITF, PDGFRA, POLD1 and POLE (sequencing only); EPCAM and GREM1 (deletion/duplication only). RNA data is routinely analyzed for use in variant interpretation for all genes.       CURRENT THERAPY:  Concurrent chemoradiation with Xeloda 20057min the AM and 150011mn the PM M-F on days of Radiation starting 12/05/20.   INTERVAL HISTORY:  Cesar Herrera here for a follow up of rectal cancer. He was last seen by me on 12/23/20. He presents to the clinic with his wife. He is tolerating chemo and radiation well overall.  He does have mild rectal irritation, no skin breakdown.  He also noticed urinary urgency this morning, no dysuria or frequency.  No fever or chills.  Mild fatigue, functions very well.  No other complaints.  All other systems were reviewed with the patient and are negative.  MEDICAL HISTORY:  Past Medical History:  Diagnosis Date  . [place] . Family history of bladder cancer   . Family history of lymphoma   . H/O epididymitis 2011  . Seasonal allergies     SURGICAL HISTORY: Past Surgical History:  Procedure Laterality Date  . WISDOM TOOTH EXTRACTION      I have reviewed the social history and family history with the patient and they are unchanged from previous note.  ALLERGIES:  is allergic to erythromycin and penicillins.  MEDICATIONS:  Current  Outpatient Medications  Medication Sig Dispense Refill  . ALPRAZolam (XANAX) 0.25 MG tablet Take 1 tablet (0.25 mg total) by mouth 2 (two) times daily as needed for anxiety. 30 tablet 0  . capecitabine (XELODA) 500 MG tablet TAKE 4 TABS (2000 MG) IN THE MORNING AND 3 TABS (1500 MG) IN THE EVENING ON DAYS OF RADIATION ONLY, MONDAY THROUGH FRIDAY. TAKE AFTER A MEAL 105 tablet 0  . Multiple Vitamins-Minerals (MULTIVITAMIN) tablet Take 1 tablet by mouth daily.     No current facility-administered medications for this visit.    PHYSICAL EXAMINATION: ECOG PERFORMANCE STATUS: 1 - Symptomatic but completely ambulatory  Vitals:   01/03/21 1327  BP: 118/74  Pulse: 71  Resp: 17  Temp: 97.9 F (36.6 C)  SpO2: 99%   Filed Weights   01/03/21 1327  Weight: 179 lb 12.8 oz (81.6 kg)    Due to COVID19 we will limit examination to appearance. Patient had no complaints.  GENERAL:alert, no distress and comfortable SKIN: skin color normal, no rashes or significant lesions EYES: normal, Conjunctiva are pink and non-injected, sclera clear  NEURO: alert & oriented x 3 with fluent speech  LABORATORY DATA:  I have reviewed the data as  listed CBC Latest Ref Rng & Units 01/03/2021 12/23/2020 12/12/2020  WBC 4.0 - 10.5 K/uL 4.1 5.0 3.6(L)  Hemoglobin 13.0 - 17.0 g/dL 13.6 14.5 13.8  Hematocrit 39.0 - 52.0 % 39.3 42.1 40.4  Platelets 150 - 400 K/uL 161 166 195     CMP Latest Ref Rng & Units 01/03/2021 12/23/2020 12/12/2020  Glucose 70 - 99 mg/dL 102(H) 97 118(H)  BUN 6 - 20 mg/dL _0 Creatinine 0.61 - 1.24 mg/dL 0.98 1.00 1.01  Sodium 135 - 145 mmol/L 142 140 139  Potassium 3.5 - 5.1 mmol/L 4.1 4.2 4.0  Chloride 98 - 111 mmol/L 105 102 104  CO2 22 - 32 mmol/L _1 Calcium 8.9 - 10.3 mg/dL 9.1 9.5 8.9  Total Protein 6.5 - 8.1 g/dL 7.0 7.1 6.6  Total Bilirubin 0.3 - 1.2 mg/dL 0.4 0.5 0.3  Alkaline Phos 38 - 126 U/L 61 65 59  AST 15 - 41 U/L _2 ALT 0 - 44 U/L _3 RADIOGRAPHIC STUDIES: I have personally reviewed the radiological images as listed and agreed with the findings in the report. No results found.   ASSESSMENT & PLAN:  CAMRAN KEADY is a 50 y.o. male with    1.Adenocarcinoma of the rectum, cT3N0/N1,stage II or III -He was diagnosed in 11/2020 by colonoscopy which showed upper/mid rectal adenocarcinoma.  -Staging work-up shows 1.4 hypodense lesion in the right lobe, otherwise no evidence of distant metastatic disease. Liver MRI showed benign hemangioma in right lobe of liver.  -Local staging pelvic MRI shows T3N1 disease, however the node is a single 5 mm mesorectal lymph node and could be reactive,could be N0. I spoke with radiology about this  -I started him on standard treatment with neoadjuvant concurrent chemo/RT with Xeloda 2072m in the AM and 15025min the PM M-F on days of Radiation beginning on 12/05/20. This will be followed bycurative surgery,then 4 months adjuvant chemo based on final surgical path.If he has significant residual disease T4 or node positive will recommend Xeloda/Capox/FOLFOX. He agrees with plan.  -He has seen local surgeon Dr. AlLeighton Ruffnd [facility] -He is tolerating CCRT well overall.  We discussed management of constipation and hemorrhoid -Labs reviewed, CBC And CMP WNL. Will continue Xeloda 200060mn the AM and 1500m38m the PM M-F and continue Radiation daily.  -Follow-up in a week, he will complete treatment on May 11  2. Genetics  -Hequalifies for genetics due to his young age, he is interested and agreeable. A referral was placed today. -His daughters are ages 13 a46 15. 65 briefly discussed initiating their screenings when they reached the age of 38 w77ch is 10 years prior to his diagnosis -If he is found to have genetic mutation, we would recommend for his children (and ? Siblings) to get tested as appropriate    PLAN: -Continue  Xeloda 2000mg81mthe AM and 1500mg 48mhe PM M-F  -Continue Radiation  -Labs and F/u on 5/9    No problem-specific Assessment & Plan notes found for this encounter.   No orders of the defined types were placed in this encounter.  All questions were answered. The patient knows to call the clinic with any problems, questions or concerns. No barriers to learning was detected. The total time spent in the appointment was 20 minutes.     Meeya Goldin FeTruitt Merle  01/03/2021   I, Joslyn Devon, am acting as scribe for [name], MD.   I have reviewed the above documentation for accuracy and completeness, and I agree with the above.

## 2021-01-03 NOTE — Progress Notes (Deleted)
[place]   Telephone:(336) [phone] Fax:(336) [phone]   Clinic Follow up Note   Patient Care Team: [name], MD as PCP - General (Internal Medicine) [name], RN as Oncology Nurse Navigator [name], NP as Nurse Practitioner (Nurse Practitioner) [name], MD as Consulting Physician (Oncology)  Date of Service:  01/03/2021  CHIEF COMPLAINT: F/u of rectal cancer   SUMMARY OF ONCOLOGIC HISTORY: Oncology History Overview Note  Cancer Staging Rectal adenocarcinoma [facility]) Staging form: Colon and Rectum, AJCC 8th Edition - Clinical stage from 11/23/2020: Stage IIIB (cT3, cN1, cM0) - Signed by [name], NP on 11/23/2020 Stage prefix: Initial diagnosis    Rectal adenocarcinoma ([place])  11/11/2020 Procedure   Colonoscopy by Dr. Ardis Hughs Impression - Three 2 to 5 mm polyps in the descending colon, in the transverse colon and in the cecum, removed with a cold snare. Complete resection. 2/3 polyps retrieved, sent to pathology. - Rectal tumor that appears malignant, distal edge 7cm from the anal verge. This was biopsied and then labeled with Spot submucosal tattoo. - The examination was otherwise normal on direct and retroflexion views.   11/11/2020 Initial Biopsy   Diagnosis 1. Descending Colon Polyp - TUBULAR ADENOMA (3 OF 3 FRAGMENTS) - NO HIGH-GRADE DYSPLASIA OR MALIGNANCY IDENTIFIED 2. Rectum, biopsy - ADENOCARCINOMA, AT LEAST INTRAMUCOSAL   11/11/2020 Tumor Marker   CEA: 1.4 CA 19-9: 10   11/15/2020 Imaging   Pelvic MRI for local staging IMPRESSION: Signs of potential T3 B disease, tortuosity of the rectum at the level of the tumor makes assessment difficult, distorted by the polypoid eccentric tumor in the high rectum as described.   N1 disease.   No extramural venous invasion. Tumor extends just to the level of the APR in this center below this level.   11/17/2020 Imaging   CT CAP IMPRESSION: 1. Known rectal mass with a 5 mm in  short axis left mesorectal lymph node better shown on the prior exam and only faintly apparent on the CT. 2. 1.4 by 1.2 by 0.9 cm hypodense lesion in the right hepatic lobe on image 50 of series 2, poorly seen on the delayed images. This lesion is indeterminate by CT and could be benign or malignant. Imaging possibilities for with further workup might include hepatic protocol MRI with and without contrast or PET-CT. 3. Lucent lesion of the left posterior L2 vertebral body potentially extending slightly into the pedicle, measuring 1.9 by 1.4 cm on image 116 of series 5. This previously measured 1.6 by 1.3 cm on 03/28/2010, accordingly making it unlikely to be a metastatic lesion. 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 4. Small right middle lobe and right lower lobe pulmonary nodules are likely benign but merit surveillance in this context.   11/18/2020 Initial Diagnosis   Rectal adenocarcinoma ([place])   11/23/2020 Cancer Staging   Staging form: Colon and Rectum, AJCC 8th Edition - Clinical stage from 11/23/2020: Stage IIIB (cT3, cN1, cM0) - Signed by [name], NP on 11/23/2020 Stage prefix: Initial diagnosis   12/02/2020 Imaging   MRI Liver  IMPRESSION: Tiny benign hemangioma in the right hepatic lobe, which corresponds to the lesion seen on recent CT. No evidence of metastatic disease within the liver or abdomen.   12/05/2020 -  Radiation Therapy   Concurrent chemoradiation with Dr Lisbeth Renshaw    12/05/2020 -  Chemotherapy   Concurrent chemoradiation with Xeloda 2096m  in the AM and $Remo'1500mg'mJdiK$  in the PM M-F on days of Radiation   12/23/2020 Genetic Testing   Negative genetic testing:  No pathogenic variants detected on the [employer] CancerNext-Expanded + RNAinsight panel. 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 The report date is 12/23/2020.  The CancerNext-Expanded + RNAinsight  gene panel offered by [employer] and includes sequencing and rearrangement analysis for the following 77 genes: AIP, ALK, APC, ATM, AXIN2, BAP1, BARD1, BLM, BMPR1A, BRCA1, BRCA2, BRIP1, CDC73, CDH1, CDK4, CDKN1B, CDKN2A, CHEK2, CTNNA1, DICER1, FANCC, FH, FLCN, GALNT12, KIF1B, LZTR1, MAX, MEN1, MET, MLH1, MSH2, MSH3, MSH6, MUTYH, NBN, NF1, NF2, NTHL1, PALB2, PHOX2B, PMS2, POT1, PRKAR1A, PTCH1, PTEN, RAD51C, RAD51D, RB1, RECQL, RET, SDHA, SDHAF2, SDHB, SDHC, SDHD, SMAD4, SMARCA4, SMARCB1, SMARCE1, STK11, SUFU, TMEM127, TP53, TSC1, TSC2, VHL and XRCC2 (sequencing and deletion/duplication); EGFR, EGLN1, HOXB13, KIT, MITF, PDGFRA, POLD1 and POLE (sequencing only); EPCAM and GREM1 (deletion/duplication only). RNA data is routinely analyzed for use in variant interpretation for all genes.       CURRENT THERAPY:  Concurrent chemoradiation with Xeloda $RemoveBef'2000mg'ZUmkdBYUsM$  in the AM and $Remo'1500mg'UhvCX$  in the PM M-F on days of Radiation starting 12/05/20.   INTERVAL HISTORY:  Cesar Herrera is here for a follow up of rectal cancer.  He is more fatigued today, but still able to function He has rectal pain with BM  REVIEW OF SYSTEMS:   Constitutional: Denies fevers, chills or abnormal weight loss Eyes: Denies blurriness of vision Ears, nose, mouth, throat, and face: Denies mucositis or sore throat Respiratory: Denies cough, dyspnea or wheezes Cardiovascular: Denies palpitation, chest discomfort or lower extremity swelling Gastrointestinal:  Denies nausea, heartburn or change in bowel habits Skin: Denies abnormal skin rashes Lymphatics: Denies new lymphadenopathy or easy bruising Neurological:Denies numbness, tingling or new weaknesses Behavioral/Psych: Mood is stable, no new changes  All other systems were reviewed with the patient and are negative.  MEDICAL HISTORY:  Past Medical History:  Diagnosis Date  . Cancer ([place])   . Family history of bladder cancer   . Family history of lymphoma   . H/O epididymitis 2011  .  Seasonal allergies     SURGICAL HISTORY: Past Surgical History:  Procedure Laterality Date  . WISDOM TOOTH EXTRACTION      I have reviewed the social history and family history with the patient and they are unchanged from previous note.  ALLERGIES:  is allergic to erythromycin and penicillins.  MEDICATIONS:  Current Outpatient Medications  Medication Sig Dispense Refill  . ALPRAZolam (XANAX) 0.25 MG tablet Take 1 tablet (0.25 mg total) by mouth 2 (two) times daily as needed for anxiety. 30 tablet 0  . capecitabine (XELODA) 500 MG tablet TAKE 4 TABS (2000 MG) IN THE MORNING AND 3 TABS (1500 MG) IN THE EVENING ON DAYS OF RADIATION ONLY, MONDAY THROUGH FRIDAY. TAKE AFTER A MEAL 105 tablet 0  . Multiple Vitamins-Minerals (MULTIVITAMIN) tablet Take 1 tablet by mouth daily.     No current facility-administered medications for this visit.    PHYSICAL EXAMINATION: ECOG PERFORMANCE STATUS: 1 - Symptomatic but completely ambulatory  Vitals:   01/03/21 1327  BP: 118/74  Pulse: 71  Resp: 17  Temp: 97.9 F (36.6 C)  SpO2: 99%   Filed Weights   01/03/21 1327  Weight: 179 lb 12.8 oz (81.6 kg)    Due to COVID19 we will limit examination to appearance. Patient had no complaints.  GENERAL:alert, no distress and comfortable SKIN: skin  color normal, no rashes or significant lesions EYES: normal, Conjunctiva are pink and non-injected, sclera clear  NEURO: alert & oriented x 3 with fluent speech  LABORATORY DATA:  I have reviewed the data as listed CBC Latest Ref Rng & Units 01/03/2021 12/23/2020 12/12/2020  WBC 4.0 - 10.5 K/uL 4.1 5.0 3.6(L)  Hemoglobin 13.0 - 17.0 g/dL 13.6 14.5 13.8  Hematocrit 39.0 - 52.0 % 39.3 42.1 40.4  Platelets 150 - 400 K/uL 161 166 195     CMP Latest Ref Rng & Units 12/23/2020 12/12/2020 12/05/2020  Glucose 70 - 99 mg/dL 97 118(H) 114(H)  BUN 6 - 20 mg/dL $Remove'10 11 14  'RrptPqh$ Creatinine 0.61 - 1.24 mg/dL 1.00 1.01 1.32(H)  Sodium 135 - 145 mmol/L 140 139 140  Potassium  3.5 - 5.1 mmol/L 4.2 4.0 4.4  Chloride 98 - 111 mmol/L 102 104 105  CO2 22 - 32 mmol/L $RemoveB'28 26 24  'tQORLExq$ Calcium 8.9 - 10.3 mg/dL 9.5 8.9 8.9  Total Protein 6.5 - 8.1 g/dL 7.1 6.6 6.9  Total Bilirubin 0.3 - 1.2 mg/dL 0.5 0.3 0.4  Alkaline Phos 38 - 126 U/L 65 59 67  AST 15 - 41 U/L $Remo'16 18 20  'OeRYU$ ALT 0 - 44 U/L $Remo'19 22 22      'wUAGW$ RADIOGRAPHIC STUDIES: I have personally reviewed the radiological images as listed and agreed with the findings in the report. No results found.   ASSESSMENT & PLAN:  Cesar Herrera is a 50 y.o. male with    1.Adenocarcinoma of the rectum, cT3N0/N1,stage II or III -He was diagnosed in 11/2020 by colonoscopy which showed upper/mid rectal adenocarcinoma.  -Staging work-up shows 1.4 hypodense lesion in the right lobe, otherwise no evidence of distant metastatic disease. Liver MRI showed benign hemangioma in right lobe of liver.  -Local staging pelvic MRI shows T3N1 disease, however the node is a single 5 mm mesorectal lymph node and could be reactive,could be N0. I spoke with radiology about this  -I started him on standard treatment with neoadjuvant concurrent chemo/RT with Xeloda $RemoveBef'2000mg'wbldoybkOk$  in the AM and $Remo'1500mg'FDvJH$  in the PM M-F on days of Radiation beginning on 12/05/20. This will be followed bycurative surgery,then 4 months adjuvant chemo based on final surgical path.If he has significant residual disease T4 or node positive will recommend Xeloda/Capox/FOLFOX. He agrees with plan.  -He has seen local surgeon Dr. Leighton Ruff and has second opinion with Dr. Morton Stall at [facility].  -He is tolerating CCRT with constipation and dehydration and pain with BMs that improved with senakot-S and IV Fluids. I reviewed management with him.  -Labs reviewed, CBC And CMP WNL except Lymphocyte count 0.6. Will continue Xeloda $RemoveBeforeD'2000mg'OSFcEvJzMOlSrG$  in the AM and $Remo'1500mg'OrShe$  in the PM M-F and continue Radiation daily. I recommend he watch for skin irritation and breakdown as he continues treatment.  -Lab and F/u  on 5/2 and 5/9  2. Genetics  -Hequalifies for genetics due to his young age, he is interested and agreeable. A referral was placed today. -His daughters are ages 39 and 1. We briefly discussed initiating their screenings when they reached the age of 76 which is 10 years prior to his diagnosis -If he is found to have genetic mutation, we would recommend for his children (and ? Siblings) to get tested as appropriate   3. Diarrhea, Neurological symptoms with ache, secondary to #1, Constipation  -He notes having numbness and tingling of his inner upper thigh, groin and around genitals. This may ache but  no true pain, no change in urination.  -He has become constipation on CCRT. He will continue to drink more water and use Senakot-S.    PLAN: -Continue Xeloda $RemoveBeforeDE'2000mg'jnDCCewEByHMCbe$  in the AM and $Remo'1500mg'RMgDy$  in the PM M-F  -Continue Radiation  -Labs and F/u on 5/2 and 5/9    No problem-specific Assessment & Plan notes found for this encounter.   No orders of the defined types were placed in this encounter.  All questions were answered. The patient knows to call the clinic with any problems, questions or concerns. No barriers to learning was detected. The total time spent in the appointment was 25 minutes.     [name], MD 01/03/2021   I, Joslyn Devon, am acting as scribe for [name], MD.   I have reviewed the above documentation for accuracy and completeness, and I agree with the above.

## 2021-01-04 ENCOUNTER — Ambulatory Visit
Admission: RE | Admit: 2021-01-04 | Discharge: 2021-01-04 | Disposition: A | Discharge status: Home / Self Care | Payer: 59 | Source: Ambulatory Visit | Attending: Radiation Oncology | Admitting: Radiation Oncology

## 2021-01-04 DIAGNOSIS — C20 Malignant neoplasm of rectum: Secondary | ICD-10-CM | POA: Diagnosis not present

## 2021-01-04 NOTE — Progress Notes (Incomplete)
[name]   Telephone:(336) [phone] Fax:(336) [phone]   Clinic Follow up Note   Patient Care Team: [name], MD as PCP - General (Internal Medicine) [name], RN as Oncology Nurse Navigator [name], NP as Nurse Practitioner (Nurse Practitioner) [name], MD as Consulting Physician (Oncology)  Date of Service:  01/04/2021  CHIEF COMPLAINT: F/u of rectal cancer  SUMMARY OF ONCOLOGIC HISTORY: Oncology History Overview Note  Cancer Staging Rectal adenocarcinoma [facility]) Staging form: Colon and Rectum, AJCC 8th Edition - Clinical stage from 11/23/2020: Stage IIIB (cT3, cN1, cM0) - Signed by [name], NP on 11/23/2020 Stage prefix: Initial diagnosis    Rectal adenocarcinoma ([place])  11/11/2020 Procedure   Colonoscopy by Dr. Ardis Hughs Impression - Three 2 to 5 mm polyps in the descending colon, in the transverse colon and in the cecum, removed with a cold snare. Complete resection. 2/3 polyps retrieved, sent to pathology. - Rectal tumor that appears malignant, distal edge 7cm from the anal verge. This was biopsied and then labeled with Spot submucosal tattoo. - The examination was otherwise normal on direct and retroflexion views.   11/11/2020 Initial Biopsy   Diagnosis 1. Descending Colon Polyp - TUBULAR ADENOMA (3 OF 3 FRAGMENTS) - NO HIGH-GRADE DYSPLASIA OR MALIGNANCY IDENTIFIED 2. Rectum, biopsy - ADENOCARCINOMA, AT LEAST INTRAMUCOSAL   11/11/2020 Tumor Marker   CEA: 1.4 CA 19-9: 10   11/15/2020 Imaging   Pelvic MRI for local staging IMPRESSION: Signs of potential T3 B disease, tortuosity of the rectum at the level of the tumor makes assessment difficult, distorted by the polypoid eccentric tumor in the high rectum as described.   N1 disease.   No extramural venous invasion. Tumor extends just to the level of the APR in this center below this level.   11/17/2020 Imaging   CT CAP IMPRESSION: 1. Known rectal mass with a 5 mm in  short axis left mesorectal lymph node better shown on the prior exam and only faintly apparent on the CT. 2. 1.4 by 1.2 by 0.9 cm hypodense lesion in the right hepatic lobe on image 50 of series 2, poorly seen on the delayed images. This lesion is indeterminate by CT and could be benign or malignant. Imaging possibilities for with further workup might include hepatic protocol MRI with and without contrast or PET-CT. 3. Lucent lesion of the left posterior L2 vertebral body potentially extending slightly into the pedicle, measuring 1.9 by 1.4 cm on image 116 of series 5. This previously measured 1.6 by 1.3 cm on 03/28/2010, accordingly making it unlikely to be a metastatic lesion. 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 4. Small right middle lobe and right lower lobe pulmonary nodules are likely benign but merit surveillance in this context.   11/18/2020 Initial Diagnosis   Rectal adenocarcinoma ([place])   11/23/2020 Cancer Staging   Staging form: Colon and Rectum, AJCC 8th Edition - Clinical stage from 11/23/2020: Stage IIIB (cT3, cN1, cM0) - Signed by [name], NP on 11/23/2020 Stage prefix: Initial diagnosis   12/02/2020 Imaging   MRI Liver  IMPRESSION: Tiny benign hemangioma in the right hepatic lobe, which corresponds to the lesion seen on recent CT. No evidence of metastatic disease within the liver or abdomen.   12/05/2020 -  Radiation Therapy   Concurrent chemoradiation with Dr Lisbeth Renshaw    12/05/2020 -  Chemotherapy   Concurrent chemoradiation with Xeloda 2031m in  the AM and 157m in the PM M-F on days of Radiation   12/23/2020 Genetic Testing   Negative genetic testing:  No pathogenic variants detected on the [employer] CancerNext-Expanded + RNAinsight panel. 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 The report date is 12/23/2020.  The CancerNext-Expanded + RNAinsight  gene panel offered by [employer] includes sequencing and rearrangement analysis for the following 77 genes: AIP, ALK, APC, ATM, AXIN2, BAP1, BARD1, BLM, BMPR1A, BRCA1, BRCA2, BRIP1, CDC73, CDH1, CDK4, CDKN1B, CDKN2A, CHEK2, CTNNA1, DICER1, FANCC, FH, FLCN, GALNT12, KIF1B, LZTR1, MAX, MEN1, MET, MLH1, MSH2, MSH3, MSH6, MUTYH, NBN, NF1, NF2, NTHL1, PALB2, PHOX2B, PMS2, POT1, PRKAR1A, PTCH1, PTEN, RAD51C, RAD51D, RB1, RECQL, RET, SDHA, SDHAF2, SDHB, SDHC, SDHD, SMAD4, SMARCA4, SMARCB1, SMARCE1, STK11, SUFU, TMEM127, TP53, TSC1, TSC2, VHL and XRCC2 (sequencing and deletion/duplication); EGFR, EGLN1, HOXB13, KIT, MITF, PDGFRA, POLD1 and POLE (sequencing only); EPCAM and GREM1 (deletion/duplication only). RNA data is routinely analyzed for use in variant interpretation for all genes.       CURRENT THERAPY:  Concurrent chemoradiation with Xeloda 20026min the AM and 150020mn the PM M-F on days of Radiation starting 12/05/20.   INTERVAL HISTORY: *** BraJEREME LOREN here for a follow up or rectal cancer. He was last seen by me last week. He presents to the clinic alone.    REVIEW OF SYSTEMS:  *** Constitutional: Denies fevers, chills or abnormal weight loss Eyes: Denies blurriness of vision Ears, nose, mouth, throat, and face: Denies mucositis or sore throat Respiratory: Denies cough, dyspnea or wheezes Cardiovascular: Denies palpitation, chest discomfort or lower extremity swelling Gastrointestinal:  Denies nausea, heartburn or change in bowel habits Skin: Denies abnormal skin rashes Lymphatics: Denies new lymphadenopathy or easy bruising Neurological:Denies numbness, tingling or new weaknesses Behavioral/Psych: Mood is stable, no new changes  All other systems were reviewed with the patient and are negative.  MEDICAL HISTORY:  Past Medical History:  Diagnosis Date  . [place] Family history of bladder cancer   . Family history of lymphoma   . H/O epididymitis 2011  .  Seasonal allergies     SURGICAL HISTORY: Past Surgical History:  Procedure Laterality Date  . WISDOM TOOTH EXTRACTION      I have reviewed the social history and family history with the patient and they are unchanged from previous note.  ALLERGIES:  is allergic to erythromycin and penicillins.  MEDICATIONS:  Current Outpatient Medications  Medication Sig Dispense Refill  . ALPRAZolam (XANAX) 0.25 MG tablet Take 1 tablet (0.25 mg total) by mouth 2 (two) times daily as needed for anxiety. 30 tablet 0  . capecitabine (XELODA) 500 MG tablet TAKE 4 TABS (2000 MG) IN THE MORNING AND 3 TABS (1500 MG) IN THE EVENING ON DAYS OF RADIATION ONLY, MONDAY THROUGH FRIDAY. TAKE AFTER A MEAL 105 tablet 0  . Multiple Vitamins-Minerals (MULTIVITAMIN) tablet Take 1 tablet by mouth daily.     No current facility-administered medications for this visit.    PHYSICAL EXAMINATION: ECOG PERFORMANCE STATUS: {CHL ONC ECOG PS:[phone]}  There were no vitals filed for this visit. There were no vitals filed for this visit. *** GENERAL:alert, no distress and comfortable SKIN: skin color, texture, turgor are normal, no rashes or significant lesions EYES: normal, Conjunctiva are pink and non-injected, sclera clear {OROPHARYNX:no exudate, no erythema and lips, buccal mucosa, and tongue normal}  NECK: supple, thyroid normal size, non-tender, without nodularity LYMPH:  no palpable  lymphadenopathy in the cervical, axillary {or inguinal} LUNGS: clear to auscultation and percussion with normal breathing effort HEART: regular rate & rhythm and no murmurs and no lower extremity edema ABDOMEN:abdomen soft, non-tender and normal bowel sounds Musculoskeletal:no cyanosis of digits and no clubbing  NEURO: alert & oriented x 3 with fluent speech, no focal motor/sensory deficits  LABORATORY DATA:  I have reviewed the data as listed CBC Latest Ref Rng & Units 01/03/2021 12/23/2020 12/12/2020  WBC 4.0 - 10.5 K/uL 4.1 5.0  3.6(L)  Hemoglobin 13.0 - 17.0 g/dL 13.6 14.5 13.8  Hematocrit 39.0 - 52.0 % 39.3 42.1 40.4  Platelets 150 - 400 K/uL 161 166 195     CMP Latest Ref Rng & Units 01/03/2021 12/23/2020 12/12/2020  Glucose 70 - 99 mg/dL 102(H) 97 118(H)  BUN 6 - 20 mg/dL _0 Creatinine 0.61 - 1.24 mg/dL 0.98 1.00 1.01  Sodium 135 - 145 mmol/L 142 140 139  Potassium 3.5 - 5.1 mmol/L 4.1 4.2 4.0  Chloride 98 - 111 mmol/L 105 102 104  CO2 22 - 32 mmol/L _1 Calcium 8.9 - 10.3 mg/dL 9.1 9.5 8.9  Total Protein 6.5 - 8.1 g/dL 7.0 7.1 6.6  Total Bilirubin 0.3 - 1.2 mg/dL 0.4 0.5 0.3  Alkaline Phos 38 - 126 U/L 61 65 59  AST 15 - 41 U/L _2 ALT 0 - 44 U/L _3 RADIOGRAPHIC STUDIES: I have personally reviewed the radiological images as listed and agreed with the findings in the report. No results found.   ASSESSMENT & PLAN:  Cesar HEINLE is a 50 y.o. male with    1.Adenocarcinoma of the rectum, cT3N0/N1,stage II or III -He was diagnosed in 11/2020 bycolonoscopywhichshowed upper/mid rectal adenocarcinoma.  -Staging work-up shows 1.4 hypodense lesion in the right lobe, otherwise no evidence of distant metastatic disease. Liver MRI showed benign hemangioma in right lobe of liver.  -Local staging pelvic MRI shows T3N1 disease, however the node is a single 5 mm mesorectal lymph node and could be reactive,could be N0.I spoke with radiology about this  -I started him on standardtreatment with neoadjuvant concurrentchemo/RT with Xeloda2071m in the AM and 15068min the PM M-F on days of Radiation beginning on 12/05/20. This will befollowed bycurative surgery,then 4 months adjuvant chemo based on final surgical path.If he has significant residual diseaseT4or node positive will recommend Xeloda/Capox/FOLFOX.He agrees with plan. -He has seen local surgeon Dr. AlLeighton Ruffnd [facility] -He is tolerating CCRT with constipation  and dehydration and pain with BMs that improved with senakot-S and IV Fluids. I reviewed management with him.  -Labs reviewed, CBC And CMP WNL except Lymphocyte count 0.6. Will continue Xeloda 200037mn the AM and 1500m16m the PM M-F and continue Radiation daily. I recommend he watch for skin irritation and breakdown as he continues treatment.  -Lab and F/u on 5/2 and 5/9  2. Genetics  -Hequalifies for genetics due to his young age, he is interested and agreeable. A referral was placed today. -His daughters are ages 13 a52 15. 43 briefly discussed initiating their screenings when they reached the age of 38 w29ch is 10 years prior to his diagnosis -If he is found to have genetic mutation, we would recommend for his children (and ? Siblings) to get tested as appropriate   3. Diarrhea, Neurological symptoms with ache, secondary to #1, Constipation  -  He notes havingnumbness andtingling of his inner upper thigh, groin and around genitals. This may ache but no true pain, no change in urination.  -He has become constipation on CCRT. He will continue to drink more water and use Senakot-S.    PLAN: -Continue Xeloda2016m in the AM and 15084min the PM M-F  -Continue Radiation -Labs and F/u on***   No problem-specific Assessment & Plan notes found for this encounter.   No orders of the defined types were placed in this encounter.  All questions were answered. The patient knows to call the clinic with any problems, questions or concerns. No barriers to learning was detected. The total time spent in the appointment was {CHL ONC TIME VISIT - SWTXMIW:[id number]}    AmJoslyn Devon/12/2020   I,Oneal Deputyam acting as scribe for YaTruitt MerleMD.   {Add scribe attestation statement}

## 2021-01-05 ENCOUNTER — Other Ambulatory Visit: Payer: Self-pay

## 2021-01-05 ENCOUNTER — Ambulatory Visit
Admission: RE | Admit: 2021-01-05 | Discharge: 2021-01-05 | Disposition: A | Discharge status: Home / Self Care | Payer: 59 | Source: Ambulatory Visit | Attending: Radiation Oncology | Admitting: Radiation Oncology

## 2021-01-05 DIAGNOSIS — C20 Malignant neoplasm of rectum: Secondary | ICD-10-CM | POA: Diagnosis not present

## 2021-01-06 ENCOUNTER — Ambulatory Visit
Admission: RE | Admit: 2021-01-06 | Discharge: 2021-01-06 | Disposition: A | Discharge status: Home / Self Care | Payer: 59 | Source: Ambulatory Visit | Attending: Radiation Oncology | Admitting: Radiation Oncology

## 2021-01-06 DIAGNOSIS — C20 Malignant neoplasm of rectum: Secondary | ICD-10-CM | POA: Diagnosis not present

## 2021-01-09 ENCOUNTER — Inpatient Hospital Stay: Payer: 59 | Admitting: Hematology

## 2021-01-09 ENCOUNTER — Inpatient Hospital Stay: Payer: 59

## 2021-01-09 ENCOUNTER — Ambulatory Visit
Admission: RE | Admit: 2021-01-09 | Discharge: 2021-01-09 | Disposition: A | Discharge status: Home / Self Care | Payer: 59 | Source: Ambulatory Visit | Attending: Radiation Oncology | Admitting: Radiation Oncology

## 2021-01-09 ENCOUNTER — Telehealth: Payer: Self-pay

## 2021-01-09 ENCOUNTER — Other Ambulatory Visit: Payer: 59

## 2021-01-09 ENCOUNTER — Other Ambulatory Visit: Payer: Self-pay

## 2021-01-09 DIAGNOSIS — C20 Malignant neoplasm of rectum: Secondary | ICD-10-CM

## 2021-01-09 LAB — CMP (CANCER CENTER ONLY)
ALT: 15 U/L (ref 0–44)
AST: 16 U/L (ref 15–41)
Albumin: 4.1 g/dL (ref 3.5–5.0)
Alkaline Phosphatase: 63 U/L (ref 38–126)
Anion gap: 10 (ref 5–15)
BUN: 8 mg/dL (ref 6–20)
CO2: 28 mmol/L (ref 22–32)
Calcium: 9.3 mg/dL (ref 8.9–10.3)
Chloride: 103 mmol/L (ref 98–111)
Creatinine: 1.08 mg/dL (ref 0.61–1.24)
GFR, Estimated: >60 mL/min (ref >60)
Glucose, Bld: 118 mg/dL — ABNORMAL HIGH (ref 70–99)
Potassium: 4.1 mmol/L (ref 3.5–5.1)
Sodium: 141 mmol/L (ref 135–145)
Total Bilirubin: 0.5 mg/dL (ref 0.3–1.2)
Total Protein: 6.8 g/dL (ref 6.5–8.1)

## 2021-01-09 LAB — CBC WITH DIFFERENTIAL (CANCER CENTER ONLY)
Abs Immature Granulocytes: 0.01 10*3/uL (ref 0.00–0.07)
Basophils Absolute: 0 10*3/uL (ref 0.0–0.1)
Basophils Relative: 1 %
Eosinophils Absolute: 0.2 10*3/uL (ref 0.0–0.5)
Eosinophils Relative: 3 %
HCT: 40.1 % (ref 39.0–52.0)
Hemoglobin: 13.9 g/dL (ref 13.0–17.0)
Immature Granulocytes: 0 %
Lymphocytes Relative: 5 %
Lymphs Abs: 0.3 10*3/uL — ABNORMAL LOW (ref 0.7–4.0)
MCH: 30.4 pg (ref 26.0–34.0)
MCHC: 34.7 g/dL (ref 30.0–36.0)
MCV: 87.7 fL (ref 80.0–100.0)
Monocytes Absolute: 0.4 10*3/uL (ref 0.1–1.0)
Monocytes Relative: 6 %
Neutro Abs: 5.2 10*3/uL (ref 1.7–7.7)
Neutrophils Relative %: 85 %
Platelet Count: 187 10*3/uL (ref 150–400)
RBC: 4.57 MIL/uL (ref 4.22–5.81)
RDW: 14.8 % (ref 11.5–15.5)
WBC Count: 6.1 10*3/uL (ref 4.0–10.5)
nRBC: 0 % (ref 0.0–0.2)

## 2021-01-09 NOTE — Telephone Encounter (Signed)
Patient calls stating that he saw Dr. Burr Medico last week and they had scheduled an appointment for today but he was told he could cancel this if he did okay through the weekend.  The patient states he is doing fine and does not feel he needs this appointment.  I have cancelled this.

## 2021-01-10 ENCOUNTER — Ambulatory Visit
Admission: RE | Admit: 2021-01-10 | Discharge: 2021-01-10 | Disposition: A | Discharge status: Home / Self Care | Payer: 59 | Source: Ambulatory Visit | Attending: Radiation Oncology | Admitting: Radiation Oncology

## 2021-01-10 DIAGNOSIS — C20 Malignant neoplasm of rectum: Secondary | ICD-10-CM | POA: Diagnosis not present

## 2021-01-11 ENCOUNTER — Encounter: Payer: Self-pay | Admitting: Radiation Oncology

## 2021-01-11 ENCOUNTER — Ambulatory Visit
Admission: RE | Admit: 2021-01-11 | Discharge: 2021-01-11 | Disposition: A | Discharge status: Home / Self Care | Payer: 59 | Source: Ambulatory Visit | Attending: Radiation Oncology | Admitting: Radiation Oncology

## 2021-01-11 DIAGNOSIS — C20 Malignant neoplasm of rectum: Secondary | ICD-10-CM | POA: Diagnosis not present

## 2021-01-12 ENCOUNTER — Other Ambulatory Visit: Payer: Self-pay | Admitting: Hematology

## 2021-01-12 DIAGNOSIS — C20 Malignant neoplasm of rectum: Secondary | ICD-10-CM

## 2021-01-12 MED ORDER — CAPECITABINE 500 MG PO TABS: ORAL_TABLET | ORAL | 0 refills | Status: DC

## 2021-01-18 ENCOUNTER — Ambulatory Visit: Payer: Self-pay | Admitting: General Surgery

## 2021-01-18 MED ORDER — DEXTROSE 5 % IV SOLN
5.0000 mg/kg | INTRAVENOUS | Status: DC
Start: 2021-01-18 — End: 2021-10-25

## 2021-01-18 MED ORDER — DEXTROSE 5 % IV SOLN
900.0000 mg | INTRAVENOUS | Status: AC
  Administered 2021-03-10: 900 mg via INTRAVENOUS

## 2021-01-18 NOTE — H&P (Signed)
The patient is a 50 year old male who presents with colorectal cancer.50 year old male who presented to the office for evaluation of a newly diagnosed rectal cancer a couple months ago. This was seen on colonoscopy performed due to change in bowel habits and occasional rectal bleeding. A medium size mass was found in the mid rectum approximately 7 cm from the anal verge. The mass was tattooed. Pathology showed adenocarcinoma. CT scan of chest, abdomen and pelvis showed a hypodense lesion in the right hepatic lobe, approximately 1 cm diameter. There were also a couple 2 mm nodules in the lung. MRI showed a mid rectal lesion approximate 6.3 cm from the internal anal sphincter. It is thought to be T3 N1   Problem List/Past Medical Leighton Ruff, MD; 2/59/5638 11:59 AM) RECTAL CANCER (C20)  Past Surgical History Leighton Ruff, MD; 7/56/4332 11:59 AM) Colon Polyp Removal - Colonoscopy Oral Surgery Vasectomy  Diagnostic Studies History Leighton Ruff, MD; 9/51/8841 11:59 AM) Colonoscopy within last year  Allergies (Jocelyn Bond, CMA; 01/18/2021 11:30 AM) Penicillins Erythromycin *DERMATOLOGICALS* Allergies Reconciled  Medication History Leighton Ruff, MD; 6/60/6301 11:59 AM) Multiple Vitamin (Oral) Active. No Current Medications (Taken starting 01/18/2021) ALPRAZolam (0.5MG  Tablet, Oral) Active. Capecitabine (150MG  Tablet, Oral) Active. Medications Reconciled Neomycin Sulfate (500MG  Tablet, 2 (two) Oral SEE NOTE, Taken starting 01/18/2021) Active. (TAKE TWO TABLETS AT 2 PM, 3 PM, AND 10 PM THE DAY PRIOR TO SURGERY) metroNIDAZOLE (500MG  Tablet, 2 (two) Oral three times daily, Taken starting 01/18/2021) Active. (Pharmacy Instructions: Take 2 tablets at 2pm, 3pm, and 10pm the day prior to your colon operation.)  Social History Leighton Ruff, MD; 02/02/931 11:59 AM) Alcohol use Moderate alcohol use. Caffeine use Coffee. No drug use Tobacco use Former  smoker.  Family History Leighton Ruff, MD; 3/55/7322 11:59 AM) Colon Polyps Father. Diabetes Mellitus Father.  Other Problems Leighton Ruff, MD; 0/25/4270 11:59 AM) Back Pain Rectal Cancer     Review of Systems Leighton Ruff MD; 02/24/7627 11:59 AM) General Not Present- Appetite Loss, Chills, Fatigue, Fever, Night Sweats, Weight Gain and Weight Loss. Skin Not Present- Change in Wart/Mole, Dryness, Hives, Jaundice, New Lesions, Non-Healing Wounds, Rash and Ulcer. HEENT Not Present- Earache, Hearing Loss, Hoarseness, Nose Bleed, Oral Ulcers, Ringing in the Ears, Seasonal Allergies, Sinus Pain, Sore Throat, Visual Disturbances, Wears glasses/contact lenses and Yellow Eyes. Respiratory Not Present- Bloody sputum, Chronic Cough, Difficulty Breathing, Snoring and Wheezing. Breast Not Present- Breast Mass, Breast Pain, Nipple Discharge and Skin Changes. Cardiovascular Not Present- Chest Pain, Difficulty Breathing Lying Down, Leg Cramps, Palpitations, Rapid Heart Rate, Shortness of Breath and Swelling of Extremities. Gastrointestinal Present- Change in Bowel Habits. Not Present- Abdominal Pain, Bloating, Bloody Stool, Chronic diarrhea, Constipation, Difficulty Swallowing, Excessive gas, Gets full quickly at meals, Hemorrhoids, Indigestion, Nausea, Rectal Pain and Vomiting. Male Genitourinary Not Present- Blood in Urine, Change in Urinary Stream, Frequency, Impotence, Nocturia, Painful Urination, Urgency and Urine Leakage. Musculoskeletal Present- Back Pain. Not Present- Joint Pain, Joint Stiffness, Muscle Pain, Muscle Weakness and Swelling of Extremities. Neurological Not Present- Decreased Memory, Fainting, Headaches, Numbness, Seizures, Tingling, Tremor, Trouble walking and Weakness. Psychiatric Not Present- Anxiety, Bipolar, Change in Sleep Pattern, Depression, Fearful and Frequent crying. Endocrine Not Present- Cold Intolerance, Excessive Hunger, Hair Changes, Heat Intolerance and New  Diabetes. Hematology Not Present- Blood Thinners, Easy Bruising, Excessive bleeding, Gland problems, HIV and Persistent Infections.   Physical Exam Leighton Ruff MD; 11/16/1759 12:01 PM)  General Mental Status-Alert. General Appearance-Cooperative.  Abdomen Palpation/Percussion Palpation and Percussion of the abdomen reveal -  Soft.  Rectal Anorectal Exam Internal - normal internal exam and normal sphincter tone.    Assessment & Plan Leighton Ruff MD; 6/55/3748 12:26 PM)  RECTAL CANCER (C20) Impression: 50 year old male with distal rectal cancer approximately 6 cm from anal verge. He is completed neoadjuvant chemotherapy and radiation. He is ready to proceed with robotic-assisted low anterior resection and diverting ileostomy. We have discussed this in detail including risk of anastomotic leak, sexual side effects and changes in bowel habits after surgery. All questions were answered. Patient completed his radiation on 01/11/2021. The surgery and anatomy were described to the patient as well as the risks of surgery and the possible complications. These include: Bleeding, deep abdominal infections and possible wound complications such as hernia and infection, damage to adjacent structures, leak of surgical connections, which can lead to other surgeries and possibly an ostomy, possible need for other procedures, such as abscess drains in radiology, possible prolonged hospital stay, possible diarrhea from removal of part of the colon, possible constipation from narcotics, possible bowel, bladder or sexual dysfunction if having rectal surgery, prolonged fatigue/weakness or appetite loss, possible early recurrence of of disease, possible complications of their medical problems such as heart disease or arrhythmias or lung problems, death (less than 1%). I believe the patient understands and wishes to proceed with the surgery.

## 2021-01-19 NOTE — Progress Notes (Signed)
  Patient Name: Cesar Herrera MRN: [id number] DOB: 30-Aug-1971 Referring Physician: Silvano Rusk (Profile Not Attached) Date of Service: 01/11/2021 [facility] [place]                                                        End Of Treatment Note  Diagnoses: C20-Malignant neoplasm of rectum  Cancer Staging:  Stage IIIB, cT3a-bN1Mx adenocarcinoma of the rectum  Intent: Curative  Radiation Treatment Dates: 12/05/2020 through 01/11/2021 Site Technique Total Dose (Gy) Dose per Fx (Gy) Completed Fx Beam Energies  Rectum: Rectum 3D 45/45 1.8 25/25 10X, 15X  Rectum: Rectum_Bst 3D 5.4/5.4 1.8 3/3 10X, 15X   Narrative: The patient tolerated radiation therapy relatively well. He did have rectal pain and constipation during treatment as well as some skin changes in the groin and fatigue.  Plan: The patient will receive a call in about one month from the radiation oncology department. He will continue follow up with Dr. Burr Medico and Dr. [name] as well.   ________________________________________________    Carola Rhine, [facility]

## 2021-02-01 ENCOUNTER — Telehealth: Payer: Self-pay

## 2021-02-01 NOTE — Telephone Encounter (Signed)
Patient calls with concerns over upcoming surgery and would like to discuss with Dr. Burr Medico.  I have scheduled him for Wednesday 02/15/2021 at 2:40 with Dr. Burr Medico as they have a family beach trip planned for week of 02/06/2021.

## 2021-02-10 NOTE — Progress Notes (Signed)
Cesar Herrera   Telephone:(336) [phone] [phone]) [phone]   Clinic Follow up Note   Patient Care Team: [name], MD as PCP - General (Internal Medicine) [name], NP as Nurse Practitioner (Nurse Practitioner) [name], MD as Consulting Physician (Oncology)  Date of Service:  02/15/2021  CHIEF COMPLAINT: F/u of rectal cancer   SUMMARY OF ONCOLOGIC HISTORY: Oncology History Overview Note  Cancer Staging Rectal adenocarcinoma [facility]) Staging form: Colon and Rectum, AJCC 8th Edition - Clinical stage from 11/23/2020: Stage IIIB (cT3, cN1, cM0) - Signed by [name], NP on 11/23/2020 Stage prefix: Initial diagnosis    Rectal adenocarcinoma (Cesar Herrera)  11/11/2020 Procedure   Colonoscopy by Dr. Ardis Hughs Impression - Three 2 to 5 mm polyps in the descending colon, in the transverse colon and in the cecum, removed with a cold snare. Complete resection. 2/3 polyps retrieved, sent to pathology. - Rectal tumor that appears malignant, distal edge 7cm from the anal verge. This was biopsied and then labeled with Spot submucosal tattoo. - The examination was otherwise normal on direct and retroflexion views.   11/11/2020 Initial Biopsy   Diagnosis 1. Descending Colon Polyp - TUBULAR ADENOMA (3 OF 3 FRAGMENTS) - NO HIGH-GRADE DYSPLASIA OR MALIGNANCY IDENTIFIED 2. Rectum, biopsy - ADENOCARCINOMA, AT LEAST INTRAMUCOSAL   11/11/2020 Tumor Marker   CEA: 1.4 CA 19-9: 10   11/15/2020 Imaging   Pelvic MRI for local staging IMPRESSION: Signs of potential T3 B disease, tortuosity of the rectum at the level of the tumor makes assessment difficult, distorted by the polypoid eccentric tumor in the high rectum as described.   N1 disease.   No extramural venous invasion. Tumor extends just to the level of the APR in this center below this level.   11/17/2020 Imaging   CT CAP IMPRESSION: 1. Known rectal mass with a 5 mm in short axis left mesorectal lymph node better shown  on the prior exam and only faintly apparent on the CT. 2. 1.4 by 1.2 by 0.9 cm hypodense lesion in the right hepatic lobe on image 50 of series 2, poorly seen on the delayed images. This lesion is indeterminate by CT and could be benign or malignant. Imaging possibilities for with further workup might include hepatic protocol MRI with and without contrast or PET-CT. 3. Lucent lesion of the left posterior L2 vertebral body potentially extending slightly into the pedicle, measuring 1.9 by 1.4 cm on image 116 of series 5. This previously measured 1.6 by 1.3 cm on 03/28/2010, accordingly making it unlikely to be a metastatic lesion. 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 4. Small right middle lobe and right lower lobe pulmonary nodules are likely benign but merit surveillance in this context.   11/18/2020 Initial Diagnosis   Rectal adenocarcinoma (Cesar Herrera)    11/23/2020 Cancer Staging   Staging form: Colon and Rectum, AJCC 8th Edition - Clinical stage from 11/23/2020: Stage IIIB (cT3, cN1, cM0) - Signed by [name], NP on 11/23/2020  Stage prefix: Initial diagnosis    12/02/2020 Imaging   MRI Liver  IMPRESSION: Tiny benign hemangioma in the right hepatic lobe, which corresponds to the lesion seen on recent CT. No evidence of metastatic disease within the liver or abdomen.   12/05/2020 - 01/11/2021 Radiation Therapy   Concurrent chemoradiation with Dr Lisbeth Renshaw    12/05/2020 - 01/11/2021 Chemotherapy   Concurrent chemoradiation with Xeloda 2074m in the AM and 15072m  in the PM M-F on days of Radiation   12/23/2020 Genetic Testing   Negative genetic testing:  No pathogenic variants detected on the [employer] CancerNext-Expanded + RNAinsight panel. 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 The report date is 12/23/2020.  The CancerNext-Expanded + RNAinsight gene panel offered by [employer] and includes sequencing and rearrangement analysis for the following 77 genes: AIP, ALK, APC, ATM, AXIN2, BAP1, BARD1, BLM, BMPR1A, BRCA1, BRCA2, BRIP1, CDC73, CDH1, CDK4, CDKN1B, CDKN2A, CHEK2, CTNNA1, DICER1, FANCC, FH, FLCN, GALNT12, KIF1B, LZTR1, MAX, MEN1, MET, MLH1, MSH2, MSH3, MSH6, MUTYH, NBN, NF1, NF2, NTHL1, PALB2, PHOX2B, PMS2, POT1, PRKAR1A, PTCH1, PTEN, RAD51C, RAD51D, RB1, RECQL, RET, SDHA, SDHAF2, SDHB, SDHC, SDHD, SMAD4, SMARCA4, SMARCB1, SMARCE1, STK11, SUFU, TMEM127, TP53, TSC1, TSC2, VHL and XRCC2 (sequencing and deletion/duplication); EGFR, EGLN1, HOXB13, KIT, MITF, PDGFRA, POLD1 and POLE (sequencing only); EPCAM and GREM1 (deletion/duplication only). RNA data is routinely analyzed for use in variant interpretation for all genes.       CURRENT THERAPY:  PENDING Surgery 03/10/21  INTERVAL HISTORY:  Cesar Herrera is here for a follow up of rectal cancer. He was last seen by me 01/03/21. He presents to the clinic with his wife. He notes he has some concerns about proceeding with surgery. He did follow up with Dr [name] about the surgery and possible complications recently. He notes Dr [name] notes ileostomy which is temporary. He has concern about where his incision will be and how much rectum will be removed. He is concerned about how long he will have ileostomy and if it is absolutely need it. He notes he would like to have imaging before surgery to see this.     REVIEW OF SYSTEMS:   Constitutional: Denies fevers, chills or abnormal weight loss Eyes: Denies blurriness of vision Ears, nose, mouth, throat, and face: Denies mucositis or sore throat Respiratory: Denies cough, dyspnea or wheezes Cardiovascular: Denies palpitation, chest discomfort or lower extremity swelling Gastrointestinal:  Denies nausea, heartburn or change in bowel habits Skin: Denies abnormal skin rashes Lymphatics: Denies new lymphadenopathy or easy bruising Neurological:Denies numbness, tingling or  new weaknesses Behavioral/Psych: Mood is stable, no new changes  All other systems were reviewed with the patient and are negative.  MEDICAL HISTORY:  Past Medical History:  Diagnosis Date   Cancer [facility])    Family history of bladder cancer    Family history of lymphoma    H/O epididymitis 2011   Seasonal allergies     SURGICAL HISTORY: Past Surgical History:  Procedure Laterality Date   WISDOM TOOTH EXTRACTION      I have reviewed the social history and family history with the patient and they are unchanged from previous note.  ALLERGIES:  is allergic to erythromycin and penicillins.  MEDICATIONS:  Current Outpatient Medications  Medication Sig Dispense Refill   ALPRAZolam (XANAX) 0.25 MG tablet Take 1 tablet (0.25 mg total) by mouth 2 (two) times daily as needed for anxiety. 30 tablet 0   capecitabine (XELODA) 500 MG tablet TAKE 4 TABS (2000 MG) IN THE MORNING AND 3 TABS (1500 MG) IN THE EVENING ON DAYS OF RADIATION ONLY, MONDAY THROUGH FRIDAY. TAKE AFTER A MEAL 105 tablet 0   Multiple Vitamins-Minerals (MULTIVITAMIN) tablet Take 1 tablet by mouth daily.     No current facility-administered medications for this visit.   Facility-Administered Medications Ordered in Other Visits  Medication Dose Route Frequency Provider Last Rate Last Admin   clindamycin (  CLEOCIN) 900 mg in dextrose 5 % 50 mL IVPB  900 mg Intravenous 60 min Pre-Op Leighton Ruff, MD       And   gentamicin (GARAMYCIN) 5 mg/kg in dextrose 5 % 50 mL IVPB  5 mg/kg Intravenous 60 min Pre-Op Leighton Ruff, MD        PHYSICAL EXAMINATION: ECOG PERFORMANCE STATUS: 0 - Asymptomatic  Vitals:   02/15/21 1439  BP: 118/78  Pulse: 66  Resp: 17  Temp: 97.8 F (36.6 C)  SpO2: 100%   Filed Weights   02/15/21 1439  Weight: 178 lb 11.2 oz (81.1 kg)    Due to COVID19 we will limit examination to appearance. Patient had no complaints.  GENERAL:alert, no distress and comfortable SKIN: skin color normal, no rashes  or significant lesions EYES: normal, Conjunctiva are pink and non-injected, sclera clear  NEURO: alert & oriented x 3 with fluent speech   LABORATORY DATA:  I have reviewed the data as listed CBC Latest Ref Rng & Units 01/09/2021 01/03/2021 12/23/2020  WBC 4.0 - 10.5 K/uL 6.1 4.1 5.0  Hemoglobin 13.0 - 17.0 g/dL 13.9 13.6 14.5  Hematocrit 39.0 - 52.0 % 40.1 39.3 42.1  Platelets 150 - 400 K/uL 187 161 166     CMP Latest Ref Rng & Units 01/09/2021 01/03/2021 12/23/2020  Glucose 70 - 99 mg/dL 118(H) 102(H) 97  BUN 6 - 20 mg/dL 8 13 10   Creatinine 0.61 - 1.24 mg/dL 1.08 0.98 1.00  Sodium 135 - 145 mmol/L 141 142 140  Potassium 3.5 - 5.1 mmol/L 4.1 4.1 4.2  Chloride 98 - 111 mmol/L 103 105 102  CO2 22 - 32 mmol/L 28 28 28   Calcium 8.9 - 10.3 mg/dL 9.3 9.1 9.5  Total Protein 6.5 - 8.1 g/dL 6.8 7.0 7.1  Total Bilirubin 0.3 - 1.2 mg/dL 0.5 0.4 0.5  Alkaline Phos 38 - 126 U/L 63 61 65  AST 15 - 41 U/L 16 17 16   ALT 0 - 44 U/L 15 16 19       RADIOGRAPHIC STUDIES: I have personally reviewed the radiological images as listed and agreed with the findings in the report. No results found.   ASSESSMENT & PLAN:  Cesar Herrera is a 50 y.o. male with    1. Adenocarcinoma of the rectum, cT3N0/N1, stage II or III -He was diagnosed in 11/2020 by colonoscopy which showed upper/mid rectal adenocarcinoma. -Staging work-up shows 1.4 hypodense lesion in the right lobe, otherwise no evidence of distant metastatic disease.  Liver MRI showed benign hemangioma in right lobe of liver. -Local staging pelvic MRI shows T3N1 disease, however the node is a single 5 mm mesorectal lymph node and could be reactive, could be N0. I spoke with radiology about this -He completed standard neoadjuvant concurrent chemo/RT with Xeloda 12/05/20-01/11/21. This will be followed by curative surgery planned for 03/10/21, then 4 months adjuvant chemo based on final surgical path. If he has significant residual disease T4 or node positive  will recommend Xeloda/Capox/FOLFOX. He agrees with plan.  -He notes after recent follow up with Dr [name] he has concerns and questions about proceed with surgery. I discussed he does have up to 20% change of cure with chemo and RT, but surgery is the standard care now, especially for his young age.  -Patient had many questions about repeating endoscopy or scan to evaluate his response to chemoradiation, to see if without help Dr. [name] surgical decision if he needs ileostomy or not.  I reviewed repeating  imaging is probably not needed, unless we have clinical concern of cancer progression, before surgery.  I will discuss with Dr. [name] further.  -We discussed adjuvant chemotherapy based on surgical path.if he had T4 or positive lymph nodes, I would recommend Beatrice ox, otherwise I would recommend 4.5 months of Xeloda after he recovers from surgery.   -I discussed he will likely have ileostomy with surgery which will be reversed after adjuvant chemo. This could be around 4-6 months.     2. Genetics -His genetic testing from 12/23/20 was negative for pathogenetic mutations. 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  -I will review his questions of restaging pelvic MRI with Dr [name].    No problem-specific Assessment & Plan notes found for this encounter.   No orders of the defined types were placed in this encounter.  All questions were answered. The patient knows to call the clinic with any problems, questions or concerns. No barriers to learning was detected. The total time spent in the appointment was 30 minutes.     [name], MD 02/15/2021   I, Joslyn Devon, am acting as scribe for [name], MD.   I have reviewed the above documentation for accuracy and completeness, and I agree with the above.

## 2021-02-15 ENCOUNTER — Encounter: Payer: Self-pay | Admitting: Hematology

## 2021-02-15 ENCOUNTER — Other Ambulatory Visit: Payer: Self-pay

## 2021-02-15 ENCOUNTER — Inpatient Hospital Stay: Payer: BC Managed Care – PPO | Attending: Nurse Practitioner | Admitting: Hematology

## 2021-02-15 VITALS — BP 118/78 | HR 66 | Temp 97.8°F | Resp 17 | Ht 67.0 in | Wt 178.7 lb

## 2021-02-15 DIAGNOSIS — C20 Malignant neoplasm of rectum: Secondary | ICD-10-CM | POA: Insufficient documentation

## 2021-02-16 ENCOUNTER — Telehealth: Payer: Self-pay | Admitting: Hematology

## 2021-02-16 NOTE — Telephone Encounter (Signed)
Scheduled follow-up appointment per 6/15 los. Patient is aware.

## 2021-02-20 NOTE — Progress Notes (Signed)
Patient's wife Cesar Herrera called to follow up on discussion they had with Dr. Burr Medico last Wednesday.  They were asking if a repeat MR pelvis would change anything regarding his upcoming surgery.  Per Dr. Burr Medico she heard back from Dr. [name] who stated it would not change anything.  That the surgery is based on his original imaging and scope.  She verbalized an understanding.

## 2021-02-21 NOTE — Progress Notes (Signed)
Patient's wife had called trying to find out how long his surgery will take.  I heard back from Dr. [name] and informed her that it will be around 4 hours.  She verbalized an understanding.

## 2021-02-23 NOTE — Patient Instructions (Addendum)
DUE TO COVID-19 ONLY ONE VISITOR IS ALLOWED TO COME WITH YOU AND STAY IN THE WAITING ROOM ONLY DURING PRE OP AND PROCEDURE.   **NO VISITORS ARE ALLOWED IN THE SHORT STAY AREA OR RECOVERY ROOM!!**  IF YOU WILL BE ADMITTED INTO THE HOSPITAL YOU ARE ALLOWED ONLY TWO SUPPORT PEOPLE DURING VISITATION HOURS ONLY (10AM -8PM)   The support person(s) may change daily. The support person(s) must pass our screening, gel in and out, and wear a mask at all times, including in the patient's room. Patients must also wear a mask when staff or their support person are in the room.  No visitors under the age of 3. Any visitor under the age of 5 must be accompanied by an adult.    COVID SWAB TESTING MUST BE COMPLETED ON:  Wednesday, 03-08-21  @ 10:00 AM   [address] [place], [place] [address]   You are not required to quarantine, however you are required to wear a well-fitted mask when you are out and around people not in your household.  Hand Hygiene often Do NOT share personal items Notify your provider if you are in close contact with someone who has COVID or you develop fever 100.4 or greater, new onset of sneezing, cough, sore throat, shortness of breath or body aches.         Your procedure is scheduled on:  Friday, 03-10-21   Report to [facility] Main  Entrance   Report to Short Stay at 5:15 AM   [facility])   Call this number if you have problems the morning of surgery [phone]   Follow prep instructions from surgeon's office   Do not eat food :After Midnight.   May have liquids until 4:30 AM day of surgery  CLEAR LIQUID DIET  Foods Allowed                                                                     Foods Excluded  Water, Black Coffee and tea, regular and decaf               liquids that you cannot  Plain Jell-O in any flavor  (No red)                                     see through such as: Fruit ices (not with fruit pulp)                                       milk, soups, orange juice              Iced Popsicles (No red)                                      All solid food                                   Apple juices Sports  drinks like Gatorade (No red) Lightly seasoned clear broth or consume(fat free) Sugar, honey syrup   Drink 2 Ensure drinks the night before surgery, have completed by 10 PM  Complete one Ensure drink the morning of surgery 3 hours prior to scheduled surgery by 4:30 AM.     The day of surgery:  Drink ONE (1) Pre-Surgery Clear Ensure the morning of surgery. Drink in one sitting. Do not sip.  This drink was given to you during your hospital  pre-op appointment visit. Nothing else to drink after completing the  Pre-Surgery Clear Ensure .          If you have questions, please contact your surgeon's office.     Oral Hygiene is also important to reduce your risk of infection.                                    Remember - BRUSH YOUR TEETH THE MORNING OF SURGERY WITH YOUR REGULAR TOOTHPASTE   Do NOT smoke after Midnight   Take these medicines the morning of surgery with A SIP OF WATER:  Alprazolam if needed                         You may not have any metal on your body including jewelry, and body piercing             Do not wear lotions, powders, cologne, or deodorant              Men may shave face and neck.   Do not bring valuables to the hospital. [place].   Contacts, dentures or bridgework may not be worn into surgery.   Bring small overnight bag day of surgery.   Special Instructions: Bring a copy of your healthcare power of attorney and living will documents         the day of surgery if you haven't scanned them in before.  Please read over the following fact sheets you were given: IF YOU HAVE QUESTIONS ABOUT YOUR PRE OP INSTRUCTIONS PLEASE CALL [facility] - Preparing for Surgery Before surgery, you can play an important role.  Because skin is  not sterile, your skin needs to be as free of germs as possible.  You can reduce the number of germs on your skin by washing with CHG (chlorahexidine gluconate) soap before surgery.  CHG is an antiseptic cleaner which kills germs and bonds with the skin to continue killing germs even after washing. Please DO NOT use if you have an allergy to CHG or antibacterial soaps.  If your skin becomes reddened/irritated stop using the CHG and inform your nurse when you arrive at Short Stay. Do not shave (including legs and underarms) for at least 48 hours prior to the first CHG shower.  You may shave your face/neck.  Please follow these instructions carefully:  1.  Shower with CHG Soap the night before surgery and the  morning of surgery.  2.  If you choose to wash your hair, wash your hair first as usual with your normal  shampoo.  3.  After you shampoo, rinse your hair and body thoroughly to remove the shampoo.  4.  Use CHG as you would any other liquid soap.  You can apply chg directly to the skin and wash.  Gently with a scrungie or clean washcloth.  5.  Apply the CHG Soap to your body ONLY FROM THE NECK DOWN.   Do   not use on face/ open                           Wound or open sores. Avoid contact with eyes, ears mouth and   genitals (private parts).                       Wash face,  Genitals (private parts) with your normal soap.             6.  Wash thoroughly, paying special attention to the area where your    surgery  will be performed.  7.  Thoroughly rinse your body with warm water from the neck down.  8.  DO NOT shower/wash with your normal soap after using and rinsing off the CHG Soap.                9.  Pat yourself dry with a clean towel.            10.  Wear clean pajamas.            11.  Place clean sheets on your bed the night of your first shower and do not  sleep with pets. Day of Surgery : Do not apply any lotions/deodorants the morning of surgery.  Please wear  clean clothes to the hospital/surgery center.  FAILURE TO FOLLOW THESE INSTRUCTIONS MAY RESULT IN THE CANCELLATION OF YOUR SURGERY  PATIENT SIGNATURE_________________________________  NURSE SIGNATURE__________________________________  ________________________________________________________________________   Adam Phenix  An incentive spirometer is a tool that can help keep your lungs clear and active. This tool measures how well you are filling your lungs with each breath. Taking long deep breaths may help reverse or decrease the chance of developing breathing (pulmonary) problems (especially infection) following: A long period of time when you are unable to move or be active. BEFORE THE PROCEDURE  If the spirometer includes an indicator to show your best effort, your nurse or respiratory therapist will set it to a desired goal. If possible, sit up straight or lean slightly forward. Try not to slouch. Hold the incentive spirometer in an upright position. INSTRUCTIONS FOR USE  Sit on the edge of your bed if possible, or sit up as far as you can in bed or on a chair. Hold the incentive spirometer in an upright position. Breathe out normally. Place the mouthpiece in your mouth and seal your lips tightly around it. Breathe in slowly and as deeply as possible, raising the piston or the ball toward the top of the column. Hold your breath for 3-5 seconds or for as long as possible. Allow the piston or ball to fall to the bottom of the column. Remove the mouthpiece from your mouth and breathe out normally. Rest for a few seconds and repeat Steps 1 through 7 at least 10 times every 1-2 hours when you are awake. Take your time and take a few normal breaths between deep breaths. The spirometer may include an indicator to show your best effort. Use the indicator as a goal to work toward during each repetition. After each set of 10 deep breaths, practice coughing to be sure your  lungs are  clear. If you have an incision (the cut made at the time of surgery), support your incision when coughing by placing a pillow or rolled up towels firmly against it. Once you are able to get out of bed, walk around indoors and cough well. You may stop using the incentive spirometer when instructed by your caregiver.  RISKS AND COMPLICATIONS Take your time so you do not get dizzy or light-headed. If you are in pain, you may need to take or ask for pain medication before doing incentive spirometry. It is harder to take a deep breath if you are having pain. AFTER USE Rest and breathe slowly and easily. It can be helpful to keep track of a log of your progress. Your caregiver can provide you with a simple table to help with this. If you are using the spirometer at home, follow these instructions: [place] IF:  You are having difficultly using the spirometer. You have trouble using the spirometer as often as instructed. Your pain medication is not giving enough relief while using the spirometer. You develop fever of 100.5 F (38.1 C) or higher. SEEK IMMEDIATE MEDICAL CARE IF:  You cough up bloody sputum that had not been present before. You develop fever of 102 F (38.9 C) or greater. You develop worsening pain at or near the incision site. MAKE SURE YOU:  Understand these instructions. Will watch your condition. Will get help right away if you are not doing well or get worse. Document Released: 12/31/2006 Document Revised: 11/12/2011 Document Reviewed: 03/03/2007 ExitCare Patient Information 2014 ExitCare, [place].   ________________________________________________________________________  WHAT IS A BLOOD TRANSFUSION? Blood Transfusion Information  A transfusion is the replacement of blood or some of its parts. Blood is made up of multiple cells which provide different functions. Red blood cells carry oxygen and are used for blood loss replacement. White blood cells fight against  infection. Platelets control bleeding. Plasma helps clot blood. Other blood products are available for specialized needs, such as hemophilia or other clotting disorders. BEFORE THE TRANSFUSION  Who gives blood for transfusions?  Healthy volunteers who are fully evaluated to make sure their blood is safe. This is blood bank blood. Transfusion therapy is the safest it has ever been in the practice of medicine. Before blood is taken from a donor, a complete history is taken to make sure that person has no history of diseases nor engages in risky social behavior (examples are intravenous drug use or sexual activity with multiple partners). The donor's travel history is screened to minimize risk of transmitting infections, such as malaria. The donated blood is tested for signs of infectious diseases, such as HIV and hepatitis. The blood is then tested to be sure it is compatible with you in order to minimize the chance of a transfusion reaction. If you or a relative donates blood, this is often done in anticipation of surgery and is not appropriate for emergency situations. It takes many days to process the donated blood. RISKS AND COMPLICATIONS Although transfusion therapy is very safe and saves many lives, the main dangers of transfusion include:  Getting an infectious disease. Developing a transfusion reaction. This is an allergic reaction to something in the blood you were given. Every precaution is taken to prevent this. The decision to have a blood transfusion has been considered carefully by your caregiver before blood is given. Blood is not given unless the benefits outweigh the risks. AFTER THE TRANSFUSION Right after receiving a blood transfusion,  you will usually feel much better and more energetic. This is especially true if your red blood cells have gotten low (anemic). The transfusion raises the level of the red blood cells which carry oxygen, and this usually causes an energy increase. The  nurse administering the transfusion will monitor you carefully for complications. HOME CARE INSTRUCTIONS  No special instructions are needed after a transfusion. You may find your energy is better. Speak with your caregiver about any limitations on activity for underlying diseases you may have. SEEK MEDICAL CARE IF:  Your condition is not improving after your transfusion. You develop redness or irritation at the intravenous (IV) site. SEEK IMMEDIATE MEDICAL CARE IF:  Any of the following symptoms occur over the next 12 hours: Shaking chills. You have a temperature by mouth above 102 F (38.9 C), not controlled by medicine. Chest, back, or muscle pain. People around you feel you are not acting correctly or are confused. Shortness of breath or difficulty breathing. Dizziness and fainting. You get a rash or develop hives. You have a decrease in urine output. Your urine turns a dark color or changes to pink, red, or brown. Any of the following symptoms occur over the next 10 days: You have a temperature by mouth above 102 F (38.9 C), not controlled by medicine. Shortness of breath. Weakness after normal activity. The white part of the eye turns yellow (jaundice). You have a decrease in the amount of urine or are urinating less often. Your urine turns a dark color or changes to pink, red, or brown. Document Released: 08/17/2000 Document Revised: 11/12/2011 Document Reviewed: 04/05/2008 [facility] Patient Information 2014 [place], [place].  _______________________________________________________________________

## 2021-02-23 NOTE — Progress Notes (Addendum)
COVID Vaccine Completed: Yes x3  Date COVID Vaccine completed: 10-29-19, 11-26-19 Has received booster: 08-06-20 COVID vaccine manufacturer: [employer]   Date of COVID positive in last 90 days: N/A  PCP - Billey Gosling, MD Cardiologist - NA  Chest x-ray -  CT chest 11-17-20 Epic EKG - N/A Stress Test - N/A ECHO - N/A Cardiac Cath - N/A Pacemaker/ICD device last checked: Spinal Cord Stimulator:  Sleep Study - N/A CPAP -   Fasting Blood Sugar - N/A Checks Blood Sugar _____ times a day  Blood Thinner Instructions: N/A Aspirin Instructions: Last Dose:  Activity level:  Can go up a flight of stairs and perform activities of daily living without stopping and without symptoms of chest pain or shortness of breath.  Able to exercise without symptoms   Anesthesia review:  N/A  Patient denies shortness of breath, fever, cough and chest pain at PAT appointment   Patient verbalized understanding of instructions that were given to them at the PAT appointment. Patient was also instructed that they will need to review over the PAT instructions again at home before surgery.

## 2021-02-27 ENCOUNTER — Other Ambulatory Visit (HOSPITAL_COMMUNITY): Payer: Self-pay

## 2021-03-02 NOTE — Progress Notes (Signed)
  Radiation Oncology         [phone] ________________________________  Name: Cesar Herrera MRN: [id number]  Date of Service: 03/13/2021  DOB: 09/16/1970  Post Treatment Telephone Note  Diagnosis:   Stage IIIB, cT3a-bN1Mx adenocarcinoma of the rectum  Interval Since Last Radiation:  9 weeks   12/05/2020 through 01/11/2021 Site Technique Total Dose (Gy) Dose per Fx (Gy) Completed Fx Beam Energies  Rectum: Rectum 3D 45/45 1.8 25/25 10X, 15X  Rectum: Rectum_Bst 3D 5.4/5.4 1.8 3/3 10X, 15X    Narrative:  The patient was contacted today for routine follow-up. During treatment he did very well with radiotherapy and did not have significant desquamation. He just had LAR last week on 03/10/21, and reports he is doing well with his surgical recovery so far.  Impression/Plan: 1. Stage IIIB, cT3a-bN1Mx adenocarcinoma of the rectum. The patient has been doing well since completion of radiotherapy. We discussed that we would be happy to continue to follow him as needed, but he will also continue to follow up with Dr. Burr Medico in medical oncology and Dr. [name] in colorectal surgery.     Carola Rhine, PAC

## 2021-03-03 ENCOUNTER — Ambulatory Visit (HOSPITAL_COMMUNITY)
Admission: RE | Admit: 2021-03-03 | Discharge: 2021-03-03 | Disposition: A | Discharge status: Home / Self Care | Payer: BC Managed Care – PPO | Source: Ambulatory Visit | Attending: Nurse Practitioner | Admitting: Nurse Practitioner

## 2021-03-03 ENCOUNTER — Other Ambulatory Visit: Payer: Self-pay

## 2021-03-03 DIAGNOSIS — Z433 Encounter for attention to colostomy: Secondary | ICD-10-CM

## 2021-03-03 DIAGNOSIS — Z7189 Other specified counseling: Secondary | ICD-10-CM

## 2021-03-03 DIAGNOSIS — Z01818 Encounter for other preprocedural examination: Secondary | ICD-10-CM | POA: Insufficient documentation

## 2021-03-03 DIAGNOSIS — C20 Malignant neoplasm of rectum: Secondary | ICD-10-CM | POA: Insufficient documentation

## 2021-03-03 NOTE — Progress Notes (Signed)
[facility]   Reason for visit:  Pre-surgical marking and ostomy education HPI:  Rectal cancer with radiation and upcoming low anterior resection. Possible ostomy.  Likely colostomy, but will mark for both.   ROS  Review of Systems Vital signs:  BP (!) 139/92 (BP Location: Right Arm)   Pulse 67   Temp 98.7 F (37.1 C) (Oral)   Resp 17   SpO2 100%  Exam:  Physical Exam  Discussed surgical procedure and stoma creation with patient and family.  Explained role of the [place] nurse team.  Provided the patient with educational booklet and provided samples of pouching options.  Answered patient and family questions.   Examined patient lying, sitting, and standing in order to place the marking in the patient's visual field, away from any creases or abdominal contour issues and within the rectus muscle.  Attempted to mark below the patient's belt line.   Marked for colostomy in the LUQ  3 cm to the left of the umbilicus and 3 cm above  the umbilicus Marked for ileostomy in the .RUQ 3 cm to the right and 3 cm above the umbilicus.  *Marked high due to low umbilicus, rounded lower abdomen resulting in a potentially less secure pouch fit in the area.  Patient and wife are in agreement with the markings selected.  Patient's abdomen cleansed with CHG wipes at site markings, allowed to air dry prior to marking.Covered mark with thin film transparent dressing to preserve mark until date of surgery.   [place] Nurse team will follow up with patient after surgery for continue ostomy care and teaching.  Education provided:  See above    Impression/dx  Upcoming surgery with possible ostomy Discussion  Patient and wife at bedside  Discussed life with an ostomy.  Patient works in [employer].  Riding in car daily.  He enjoys hiking and being outside in his free time. He is interested in coloplast pouches.  I will set him up with this but he understands we will use [employer] products while inpatient.    Plan  See back in clinic postoperatively as needed for ongoing education.  Written materials on  ostomy care provided.     Visit time: 75 minutes.   Domenic Moras FNP-BC

## 2021-03-07 ENCOUNTER — Other Ambulatory Visit: Payer: Self-pay

## 2021-03-07 ENCOUNTER — Encounter (HOSPITAL_COMMUNITY): Payer: Self-pay

## 2021-03-07 ENCOUNTER — Encounter (HOSPITAL_COMMUNITY)
Admission: RE | Admit: 2021-03-07 | Discharge: 2021-03-07 | Disposition: A | Discharge status: Home / Self Care | Payer: BC Managed Care – PPO | Source: Ambulatory Visit | Attending: General Surgery | Admitting: General Surgery

## 2021-03-07 DIAGNOSIS — Z01812 Encounter for preprocedural laboratory examination: Secondary | ICD-10-CM | POA: Insufficient documentation

## 2021-03-07 DIAGNOSIS — K567 Ileus, unspecified: Secondary | ICD-10-CM | POA: Diagnosis not present

## 2021-03-07 DIAGNOSIS — Z20822 Contact with and (suspected) exposure to covid-19: Secondary | ICD-10-CM | POA: Diagnosis not present

## 2021-03-07 DIAGNOSIS — Z923 Personal history of irradiation: Secondary | ICD-10-CM | POA: Diagnosis not present

## 2021-03-07 DIAGNOSIS — F419 Anxiety disorder, unspecified: Secondary | ICD-10-CM | POA: Diagnosis not present

## 2021-03-07 DIAGNOSIS — Z833 Family history of diabetes mellitus: Secondary | ICD-10-CM | POA: Diagnosis not present

## 2021-03-07 DIAGNOSIS — C20 Malignant neoplasm of rectum: Secondary | ICD-10-CM | POA: Diagnosis not present

## 2021-03-07 DIAGNOSIS — Z800 Family history of malignant neoplasm of digestive organs: Secondary | ICD-10-CM | POA: Diagnosis not present

## 2021-03-07 DIAGNOSIS — Z87891 Personal history of nicotine dependence: Secondary | ICD-10-CM | POA: Diagnosis not present

## 2021-03-07 DIAGNOSIS — Z9221 Personal history of antineoplastic chemotherapy: Secondary | ICD-10-CM | POA: Diagnosis not present

## 2021-03-07 DIAGNOSIS — F418 Other specified anxiety disorders: Secondary | ICD-10-CM | POA: Diagnosis not present

## 2021-03-07 LAB — CBC
HCT: 40.8 % (ref 39.0–52.0)
Hemoglobin: 14 g/dL (ref 13.0–17.0)
MCH: 31.4 pg (ref 26.0–34.0)
MCHC: 34.3 g/dL (ref 30.0–36.0)
MCV: 91.5 fL (ref 80.0–100.0)
Platelets: 199 10*3/uL (ref 150–400)
RBC: 4.46 MIL/uL (ref 4.22–5.81)
RDW: 12.2 % (ref 11.5–15.5)
WBC: 5 10*3/uL (ref 4.0–10.5)
nRBC: 0 % (ref 0.0–0.2)

## 2021-03-08 ENCOUNTER — Other Ambulatory Visit (HOSPITAL_COMMUNITY)
Admission: RE | Admit: 2021-03-08 | Discharge: 2021-03-08 | Disposition: A | Discharge status: Home / Self Care | Payer: BC Managed Care – PPO | Source: Ambulatory Visit | Attending: General Surgery | Admitting: General Surgery

## 2021-03-08 DIAGNOSIS — Z20822 Contact with and (suspected) exposure to covid-19: Secondary | ICD-10-CM | POA: Insufficient documentation

## 2021-03-08 DIAGNOSIS — Z01812 Encounter for preprocedural laboratory examination: Secondary | ICD-10-CM | POA: Insufficient documentation

## 2021-03-09 LAB — SARS CORONAVIRUS 2 (TAT 6-24 HRS): SARS Coronavirus 2: NEGATIVE

## 2021-03-09 MED ORDER — BUPIVACAINE LIPOSOME 1.3 % IJ SUSP
20.0000 mL | Freq: Once | INTRAMUSCULAR | Status: DC
  Filled 2021-03-09: qty 20

## 2021-03-10 ENCOUNTER — Inpatient Hospital Stay (HOSPITAL_COMMUNITY): Payer: BC Managed Care – PPO | Admitting: Certified Registered Nurse Anesthetist

## 2021-03-10 ENCOUNTER — Encounter (HOSPITAL_COMMUNITY): Payer: Self-pay | Admitting: General Surgery

## 2021-03-10 ENCOUNTER — Inpatient Hospital Stay (HOSPITAL_COMMUNITY)
Admission: RE | Admit: 2021-03-10 | Discharge: 2021-03-15 | DRG: 330 | Disposition: A | Discharge status: Home / Self Care | Payer: BC Managed Care – PPO | Attending: General Surgery | Admitting: General Surgery

## 2021-03-10 ENCOUNTER — Encounter (HOSPITAL_COMMUNITY)
Admission: RE | Disposition: A | Discharge status: Home / Self Care | Payer: Self-pay | Source: Home / Self Care | Attending: General Surgery

## 2021-03-10 DIAGNOSIS — Z923 Personal history of irradiation: Secondary | ICD-10-CM

## 2021-03-10 DIAGNOSIS — Z87891 Personal history of nicotine dependence: Secondary | ICD-10-CM | POA: Diagnosis not present

## 2021-03-10 DIAGNOSIS — Z20822 Contact with and (suspected) exposure to covid-19: Secondary | ICD-10-CM | POA: Diagnosis present

## 2021-03-10 DIAGNOSIS — Z9221 Personal history of antineoplastic chemotherapy: Secondary | ICD-10-CM

## 2021-03-10 DIAGNOSIS — Z932 Ileostomy status: Secondary | ICD-10-CM

## 2021-03-10 DIAGNOSIS — C20 Malignant neoplasm of rectum: Secondary | ICD-10-CM | POA: Diagnosis not present

## 2021-03-10 DIAGNOSIS — K567 Ileus, unspecified: Secondary | ICD-10-CM | POA: Diagnosis present

## 2021-03-10 DIAGNOSIS — F419 Anxiety disorder, unspecified: Secondary | ICD-10-CM | POA: Diagnosis present

## 2021-03-10 DIAGNOSIS — Z800 Family history of malignant neoplasm of digestive organs: Secondary | ICD-10-CM | POA: Diagnosis not present

## 2021-03-10 DIAGNOSIS — F418 Other specified anxiety disorders: Secondary | ICD-10-CM | POA: Diagnosis present

## 2021-03-10 DIAGNOSIS — Z833 Family history of diabetes mellitus: Secondary | ICD-10-CM

## 2021-03-10 DIAGNOSIS — R4589 Other symptoms and signs involving emotional state: Secondary | ICD-10-CM | POA: Diagnosis present

## 2021-03-10 HISTORY — PX: XI ROBOTIC ASSISTED LOWER ANTERIOR RESECTION: SHX6558

## 2021-03-10 LAB — TYPE AND SCREEN
ABO/RH(D): O POS
Antibody Screen: NEGATIVE

## 2021-03-10 LAB — ABO/RH: ABO/RH(D): O POS

## 2021-03-10 SURGERY — RESECTION, RECTUM, LOW ANTERIOR, ROBOT-ASSISTED
Anesthesia: General | Site: Abdomen

## 2021-03-10 MED ORDER — ONDANSETRON HCL 4 MG/2ML IJ SOLN
4.0000 mg | Freq: Four times a day (QID) | INTRAMUSCULAR | Status: DC | PRN
  Administered 2021-03-10 – 2021-03-11 (×3): 4 mg via INTRAVENOUS
  Filled 2021-03-10 (×3): qty 2

## 2021-03-10 MED ORDER — HYDROMORPHONE HCL 1 MG/ML IJ SOLN
0.2500 mg | INTRAMUSCULAR | Status: DC | PRN
  Administered 2021-03-10 (×2): 0.5 mg via INTRAVENOUS

## 2021-03-10 MED ORDER — HYDROMORPHONE HCL 1 MG/ML IJ SOLN
0.5000 mg | INTRAMUSCULAR | Status: DC | PRN
  Administered 2021-03-10 – 2021-03-11 (×3): 0.5 mg via INTRAVENOUS
  Filled 2021-03-10 (×3): qty 0.5

## 2021-03-10 MED ORDER — SUGAMMADEX SODIUM 200 MG/2ML IV SOLN
INTRAVENOUS | Status: DC | PRN
  Administered 2021-03-10: 200 mg via INTRAVENOUS

## 2021-03-10 MED ORDER — DEXAMETHASONE SODIUM PHOSPHATE 10 MG/ML IJ SOLN
INTRAMUSCULAR | Status: AC
  Filled 2021-03-10: qty 1

## 2021-03-10 MED ORDER — BUPIVACAINE-EPINEPHRINE (PF) 0.25% -1:200000 IJ SOLN
INTRAMUSCULAR | Status: AC
  Filled 2021-03-10: qty 30

## 2021-03-10 MED ORDER — SODIUM CHLORIDE 0.9 % IV SOLN
2.0000 g | Freq: Once | INTRAVENOUS | Status: AC
  Administered 2021-03-10: 2 g via INTRAVENOUS
  Filled 2021-03-10: qty 2

## 2021-03-10 MED ORDER — FENTANYL CITRATE (PF) 250 MCG/5ML IJ SOLN
INTRAMUSCULAR | Status: AC
  Filled 2021-03-10: qty 5

## 2021-03-10 MED ORDER — KETAMINE HCL 10 MG/ML IJ SOLN
INTRAMUSCULAR | Status: DC | PRN
  Administered 2021-03-10: 10 mg via INTRAVENOUS
  Administered 2021-03-10: 50 mg via INTRAVENOUS

## 2021-03-10 MED ORDER — GENTAMICIN SULFATE 40 MG/ML IJ SOLN
5.0000 mg/kg | Freq: Once | INTRAVENOUS | Status: DC
  Filled 2021-03-10: qty 10

## 2021-03-10 MED ORDER — CLINDAMYCIN PHOSPHATE 900 MG/50ML IV SOLN
INTRAVENOUS | Status: AC
  Filled 2021-03-10: qty 50

## 2021-03-10 MED ORDER — ENSURE SURGERY PO LIQD
237.0000 mL | Freq: Two times a day (BID) | ORAL | Status: DC
  Administered 2021-03-10 – 2021-03-14 (×8): 237 mL via ORAL

## 2021-03-10 MED ORDER — ROCURONIUM BROMIDE 10 MG/ML (PF) SYRINGE
PREFILLED_SYRINGE | INTRAVENOUS | Status: AC
  Filled 2021-03-10: qty 10

## 2021-03-10 MED ORDER — LIDOCAINE 2% (20 MG/ML) 5 ML SYRINGE
INTRAMUSCULAR | Status: DC | PRN
  Administered 2021-03-10: .75 mg/kg/h via INTRAVENOUS

## 2021-03-10 MED ORDER — GABAPENTIN 300 MG PO CAPS
300.0000 mg | ORAL_CAPSULE | ORAL | Status: AC
  Administered 2021-03-10: 300 mg via ORAL
  Filled 2021-03-10: qty 1

## 2021-03-10 MED ORDER — MEPERIDINE HCL 50 MG/ML IJ SOLN: 6.2500 mg | INTRAMUSCULAR | Status: DC | PRN

## 2021-03-10 MED ORDER — LACTATED RINGERS IV SOLN: INTRAVENOUS | Status: DC

## 2021-03-10 MED ORDER — MIDAZOLAM HCL 5 MG/5ML IJ SOLN
INTRAMUSCULAR | Status: DC | PRN
  Administered 2021-03-10: 2 mg via INTRAVENOUS

## 2021-03-10 MED ORDER — MIDAZOLAM HCL 2 MG/2ML IJ SOLN
INTRAMUSCULAR | Status: AC
  Filled 2021-03-10: qty 2

## 2021-03-10 MED ORDER — ONDANSETRON HCL 4 MG/2ML IJ SOLN
INTRAMUSCULAR | Status: DC | PRN
  Administered 2021-03-10: 4 mg via INTRAVENOUS

## 2021-03-10 MED ORDER — PROMETHAZINE HCL 25 MG/ML IJ SOLN: 6.2500 mg | INTRAMUSCULAR | Status: DC | PRN

## 2021-03-10 MED ORDER — GABAPENTIN 300 MG PO CAPS
300.0000 mg | ORAL_CAPSULE | Freq: Two times a day (BID) | ORAL | Status: DC
  Administered 2021-03-10 – 2021-03-11 (×3): 300 mg via ORAL
  Filled 2021-03-10 (×3): qty 1

## 2021-03-10 MED ORDER — BUPIVACAINE LIPOSOME 1.3 % IJ SUSP
INTRAMUSCULAR | Status: DC | PRN
  Administered 2021-03-10: 20 mL

## 2021-03-10 MED ORDER — PROPOFOL 10 MG/ML IV BOLUS
INTRAVENOUS | Status: AC
  Filled 2021-03-10: qty 20

## 2021-03-10 MED ORDER — HEPARIN SODIUM (PORCINE) 5000 UNIT/ML IJ SOLN
5000.0000 [IU] | Freq: Once | INTRAMUSCULAR | Status: AC
  Administered 2021-03-10: 5000 [IU] via SUBCUTANEOUS
  Filled 2021-03-10: qty 1

## 2021-03-10 MED ORDER — ACETAMINOPHEN 500 MG PO TABS
1000.0000 mg | ORAL_TABLET | ORAL | Status: AC
  Administered 2021-03-10: 1000 mg via ORAL
  Filled 2021-03-10: qty 2

## 2021-03-10 MED ORDER — OXYCODONE HCL 5 MG PO TABS: 5.0000 mg | ORAL_TABLET | Freq: Once | ORAL | Status: DC | PRN

## 2021-03-10 MED ORDER — INDOCYANINE GREEN 25 MG IV SOLR
INTRAVENOUS | Status: DC | PRN
  Administered 2021-03-10: 7.5 mg via INTRAVENOUS

## 2021-03-10 MED ORDER — LACTATED RINGERS IR SOLN
Status: DC | PRN
  Administered 2021-03-10: 1000 mL

## 2021-03-10 MED ORDER — LIDOCAINE 2% (20 MG/ML) 5 ML SYRINGE
INTRAMUSCULAR | Status: AC
  Filled 2021-03-10: qty 5

## 2021-03-10 MED ORDER — 0.9 % SODIUM CHLORIDE (POUR BTL) OPTIME
TOPICAL | Status: DC | PRN
  Administered 2021-03-10: 2000 mL

## 2021-03-10 MED ORDER — KETAMINE HCL 10 MG/ML IJ SOLN
INTRAMUSCULAR | Status: AC
  Filled 2021-03-10: qty 1

## 2021-03-10 MED ORDER — PROPOFOL 10 MG/ML IV BOLUS
INTRAVENOUS | Status: DC | PRN
  Administered 2021-03-10: 200 mg via INTRAVENOUS

## 2021-03-10 MED ORDER — ALVIMOPAN 12 MG PO CAPS
12.0000 mg | ORAL_CAPSULE | Freq: Two times a day (BID) | ORAL | Status: DC
  Administered 2021-03-11 – 2021-03-12 (×2): 12 mg via ORAL
  Filled 2021-03-10 (×2): qty 1

## 2021-03-10 MED ORDER — BUPIVACAINE-EPINEPHRINE 0.25% -1:200000 IJ SOLN
INTRAMUSCULAR | Status: DC | PRN
  Administered 2021-03-10: 30 mL

## 2021-03-10 MED ORDER — DIPHENHYDRAMINE HCL 25 MG PO CAPS: 25.0000 mg | ORAL_CAPSULE | Freq: Four times a day (QID) | ORAL | Status: DC | PRN

## 2021-03-10 MED ORDER — HYDROMORPHONE HCL 1 MG/ML IJ SOLN
INTRAMUSCULAR | Status: AC
  Filled 2021-03-10: qty 1

## 2021-03-10 MED ORDER — SACCHAROMYCES BOULARDII 250 MG PO CAPS
250.0000 mg | ORAL_CAPSULE | Freq: Two times a day (BID) | ORAL | Status: DC
  Administered 2021-03-10 – 2021-03-11 (×3): 250 mg via ORAL
  Filled 2021-03-10 (×3): qty 1

## 2021-03-10 MED ORDER — DEXAMETHASONE SODIUM PHOSPHATE 10 MG/ML IJ SOLN
INTRAMUSCULAR | Status: DC | PRN
  Administered 2021-03-10: 8 mg via INTRAVENOUS

## 2021-03-10 MED ORDER — LIDOCAINE 2% (20 MG/ML) 5 ML SYRINGE: INTRAMUSCULAR | Status: DC | PRN

## 2021-03-10 MED ORDER — CLINDAMYCIN PHOSPHATE 900 MG/50ML IV SOLN: 900.0000 mg | Freq: Once | INTRAVENOUS | Status: DC

## 2021-03-10 MED ORDER — ONDANSETRON HCL 4 MG PO TABS: 4.0000 mg | ORAL_TABLET | Freq: Four times a day (QID) | ORAL | Status: DC | PRN

## 2021-03-10 MED ORDER — SIMETHICONE 80 MG PO CHEW
40.0000 mg | CHEWABLE_TABLET | Freq: Four times a day (QID) | ORAL | Status: DC | PRN
  Administered 2021-03-11: 40 mg via ORAL
  Filled 2021-03-10: qty 1

## 2021-03-10 MED ORDER — DIPHENHYDRAMINE HCL 50 MG/ML IJ SOLN
25.0000 mg | Freq: Four times a day (QID) | INTRAMUSCULAR | Status: DC | PRN
  Administered 2021-03-13 – 2021-03-14 (×2): 25 mg via INTRAVENOUS
  Filled 2021-03-10 (×2): qty 1

## 2021-03-10 MED ORDER — ONDANSETRON HCL 4 MG/2ML IJ SOLN
INTRAMUSCULAR | Status: AC
  Filled 2021-03-10: qty 2

## 2021-03-10 MED ORDER — ACETAMINOPHEN 500 MG PO TABS
1000.0000 mg | ORAL_TABLET | Freq: Four times a day (QID) | ORAL | Status: DC
  Administered 2021-03-10 – 2021-03-15 (×19): 1000 mg via ORAL
  Filled 2021-03-10 (×20): qty 2

## 2021-03-10 MED ORDER — LIDOCAINE 2% (20 MG/ML) 5 ML SYRINGE
INTRAMUSCULAR | Status: DC | PRN
  Administered 2021-03-10: 80 mg via INTRAVENOUS

## 2021-03-10 MED ORDER — OXYCODONE HCL 5 MG/5ML PO SOLN: 5.0000 mg | Freq: Once | ORAL | Status: DC | PRN

## 2021-03-10 MED ORDER — ORAL CARE MOUTH RINSE: 15.0000 mL | Freq: Once | OROMUCOSAL | Status: AC

## 2021-03-10 MED ORDER — LACTATED RINGERS IV SOLN: INTRAVENOUS | Status: DC | PRN

## 2021-03-10 MED ORDER — ALUM & MAG HYDROXIDE-SIMETH 200-200-20 MG/5ML PO SUSP
30.0000 mL | Freq: Four times a day (QID) | ORAL | Status: DC | PRN
  Administered 2021-03-12: 30 mL via ORAL
  Filled 2021-03-10: qty 30

## 2021-03-10 MED ORDER — ROCURONIUM BROMIDE 10 MG/ML (PF) SYRINGE
PREFILLED_SYRINGE | INTRAVENOUS | Status: DC | PRN
  Administered 2021-03-10 (×2): 10 mg via INTRAVENOUS
  Administered 2021-03-10: 100 mg via INTRAVENOUS
  Administered 2021-03-10: 40 mg via INTRAVENOUS

## 2021-03-10 MED ORDER — ENOXAPARIN SODIUM 40 MG/0.4ML IJ SOSY
40.0000 mg | PREFILLED_SYRINGE | INTRAMUSCULAR | Status: DC
  Administered 2021-03-11 – 2021-03-15 (×5): 40 mg via SUBCUTANEOUS
  Filled 2021-03-10 (×5): qty 0.4

## 2021-03-10 MED ORDER — ALVIMOPAN 12 MG PO CAPS
12.0000 mg | ORAL_CAPSULE | ORAL | Status: AC
  Administered 2021-03-10: 12 mg via ORAL
  Filled 2021-03-10: qty 1

## 2021-03-10 MED ORDER — CHLORHEXIDINE GLUCONATE 0.12 % MT SOLN
15.0000 mL | Freq: Once | OROMUCOSAL | Status: AC
  Administered 2021-03-10: 15 mL via OROMUCOSAL

## 2021-03-10 MED ORDER — AMISULPRIDE (ANTIEMETIC) 5 MG/2ML IV SOLN: 10.0000 mg | Freq: Once | INTRAVENOUS | Status: DC | PRN

## 2021-03-10 MED ORDER — KCL IN DEXTROSE-NACL 20-5-0.45 MEQ/L-%-% IV SOLN
INTRAVENOUS | Status: DC
  Filled 2021-03-10 (×2): qty 1000

## 2021-03-10 MED ORDER — ENSURE PRE-SURGERY PO LIQD: 592.0000 mL | Freq: Once | ORAL | Status: DC

## 2021-03-10 MED ORDER — FENTANYL CITRATE (PF) 100 MCG/2ML IJ SOLN
INTRAMUSCULAR | Status: DC | PRN
  Administered 2021-03-10: 50 ug via INTRAVENOUS
  Administered 2021-03-10: 100 ug via INTRAVENOUS
  Administered 2021-03-10: 50 ug via INTRAVENOUS

## 2021-03-10 MED ORDER — ENSURE PRE-SURGERY PO LIQD
296.0000 mL | Freq: Once | ORAL | Status: DC
  Filled 2021-03-10: qty 296

## 2021-03-10 SURGICAL SUPPLY — 86 items
BAG COUNTER SPONGE SURGICOUNT (BAG) ×2 IMPLANT
BLADE EXTENDED COATED 6.5IN (ELECTRODE) IMPLANT
CANNULA REDUC XI 12-8 STAPL (CANNULA) ×2
CANNULA REDUCER 12-8 DVNC XI (CANNULA) ×1 IMPLANT
CELLS DAT CNTRL 66122 CELL SVR (MISCELLANEOUS) IMPLANT
COVER SURGICAL LIGHT HANDLE (MISCELLANEOUS) ×4 IMPLANT
COVER TIP SHEARS 8 DVNC (MISCELLANEOUS) ×1 IMPLANT
COVER TIP SHEARS 8MM DA VINCI (MISCELLANEOUS) ×2
DECANTER SPIKE VIAL GLASS SM (MISCELLANEOUS) IMPLANT
DRAIN CHANNEL 19F RND (DRAIN) IMPLANT
DRAPE ARM DVNC X/XI (DISPOSABLE) ×4 IMPLANT
DRAPE COLUMN DVNC XI (DISPOSABLE) ×1 IMPLANT
DRAPE DA VINCI XI ARM (DISPOSABLE) ×8
DRAPE DA VINCI XI COLUMN (DISPOSABLE) ×2
DRAPE SURG IRRIG POUCH 19X23 (DRAPES) ×2 IMPLANT
DRSG OPSITE POSTOP 4X10 (GAUZE/BANDAGES/DRESSINGS) IMPLANT
DRSG OPSITE POSTOP 4X6 (GAUZE/BANDAGES/DRESSINGS) ×2 IMPLANT
DRSG OPSITE POSTOP 4X8 (GAUZE/BANDAGES/DRESSINGS) IMPLANT
ELECT PENCIL ROCKER SW 15FT (MISCELLANEOUS) ×2 IMPLANT
ELECT REM PT RETURN 15FT ADLT (MISCELLANEOUS) ×2 IMPLANT
ENDOLOOP SUT PDS II  0 18 (SUTURE)
ENDOLOOP SUT PDS II 0 18 (SUTURE) IMPLANT
EVACUATOR SILICONE 100CC (DRAIN) ×2 IMPLANT
GLOVE SURG ENC MOIS LTX SZ6.5 (GLOVE) ×6 IMPLANT
GLOVE SURG UNDER POLY LF SZ7 (GLOVE) ×4 IMPLANT
GOWN STRL REUS W/TWL XL LVL3 (GOWN DISPOSABLE) ×6 IMPLANT
GRASPER SUT TROCAR 14GX15 (MISCELLANEOUS) IMPLANT
HOLDER FOLEY CATH W/STRAP (MISCELLANEOUS) ×2 IMPLANT
IRRIG SUCT STRYKERFLOW 2 WTIP (MISCELLANEOUS) ×2
IRRIGATION SUCT STRKRFLW 2 WTP (MISCELLANEOUS) ×1 IMPLANT
KIT PROCEDURE DA VINCI SI (MISCELLANEOUS) ×2
KIT PROCEDURE DVNC SI (MISCELLANEOUS) ×1 IMPLANT
KIT TURNOVER KIT A (KITS) ×2 IMPLANT
NEEDLE INSUFFLATION 14GA 120MM (NEEDLE) ×2 IMPLANT
PACK CARDIOVASCULAR III (CUSTOM PROCEDURE TRAY) ×2 IMPLANT
PACK COLON (CUSTOM PROCEDURE TRAY) ×2 IMPLANT
PAD POSITIONING PINK XL (MISCELLANEOUS) ×2 IMPLANT
PORT LAP GEL ALEXIS MED 5-9CM (MISCELLANEOUS) IMPLANT
RELOAD STAPLER 3.5X60 BLU DVNC (STAPLE) IMPLANT
RELOAD STAPLER 4.3X60 GRN DVNC (STAPLE) ×2 IMPLANT
RTRCTR WOUND ALEXIS 18CM MED (MISCELLANEOUS)
SCISSORS LAP 5X35 DISP (ENDOMECHANICALS) IMPLANT
SEAL CANN UNIV 5-8 DVNC XI (MISCELLANEOUS) ×3 IMPLANT
SEAL XI 5MM-8MM UNIVERSAL (MISCELLANEOUS) ×6
SEALER TISSUE G2 STRG ARTC 35C (ENDOMECHANICALS) ×2 IMPLANT
SEALER VESSEL DA VINCI XI (MISCELLANEOUS) ×2
SEALER VESSEL EXT DVNC XI (MISCELLANEOUS) ×1 IMPLANT
SIGMOIDOSCOPE DISPOSABLE (MISCELLANEOUS) ×2 IMPLANT
SOLUTION ELECTROLUBE (MISCELLANEOUS) ×2 IMPLANT
STAPLER 60 DA VINCI SURE FORM (STAPLE) ×2
STAPLER 60 SUREFORM DVNC (STAPLE) ×1 IMPLANT
STAPLER CANNULA SEAL DVNC XI (STAPLE) IMPLANT
STAPLER CANNULA SEAL XI (STAPLE)
STAPLER ECHELON POWER CIR 29 (STAPLE) IMPLANT
STAPLER ECHELON POWER CIR 31 (STAPLE) IMPLANT
STAPLER RELOAD 3.5X60 BLU DVNC (STAPLE)
STAPLER RELOAD 3.5X60 BLUE (STAPLE)
STAPLER RELOAD 4.3X60 GREEN (STAPLE) ×4
STAPLER RELOAD 4.3X60 GRN DVNC (STAPLE) ×2
STOPCOCK 4 WAY LG BORE MALE ST (IV SETS) ×4 IMPLANT
SUT ETHILON 2 0 PS N (SUTURE) IMPLANT
SUT NOVA NAB DX-16 0-1 5-0 T12 (SUTURE) ×4 IMPLANT
SUT PROLENE 2 0 KS (SUTURE) IMPLANT
SUT SILK 2 0 (SUTURE) ×2
SUT SILK 2 0 SH CR/8 (SUTURE) IMPLANT
SUT SILK 2-0 18XBRD TIE 12 (SUTURE) ×1 IMPLANT
SUT SILK 3 0 (SUTURE)
SUT SILK 3 0 SH CR/8 (SUTURE) ×2 IMPLANT
SUT SILK 3-0 18XBRD TIE 12 (SUTURE) IMPLANT
SUT V-LOC BARB 180 2/0GR6 GS22 (SUTURE)
SUT VIC AB 2-0 SH 18 (SUTURE) ×2 IMPLANT
SUT VIC AB 2-0 SH 27 (SUTURE)
SUT VIC AB 2-0 SH 27X BRD (SUTURE) IMPLANT
SUT VIC AB 3-0 SH 18 (SUTURE) IMPLANT
SUT VIC AB 4-0 PS2 27 (SUTURE) ×4 IMPLANT
SUT VICRYL 0 UR6 27IN ABS (SUTURE) ×2 IMPLANT
SUTURE V-LC BRB 180 2/0GR6GS22 (SUTURE) IMPLANT
SYR 10ML ECCENTRIC (SYRINGE) ×2 IMPLANT
SYS LAPSCP GELPORT 120MM (MISCELLANEOUS)
SYSTEM LAPSCP GELPORT 120MM (MISCELLANEOUS) IMPLANT
TOWEL OR 17X26 10 PK STRL BLUE (TOWEL DISPOSABLE) IMPLANT
TOWEL OR NON WOVEN STRL DISP B (DISPOSABLE) ×2 IMPLANT
TRAY FOLEY MTR SLVR 16FR STAT (SET/KITS/TRAYS/PACK) ×2 IMPLANT
TROCAR ADV FIXATION 5X100MM (TROCAR) ×2 IMPLANT
TUBING CONNECTING 10 (TUBING) ×4 IMPLANT
TUBING INSUFFLATION 10FT LAP (TUBING) ×2 IMPLANT

## 2021-03-10 NOTE — Consult Note (Signed)
[place] Nurse ostomy follow up Patient receiving care in [place]. Primary RN present at time of my visit Stoma type/location: RUQ ileostomy Stomal assessment/size: 1 and 1/4 inches, round, moist, sutures intact, red rubber loop support in place. Peristomal assessment: intact Treatment options for stomal/peristomal skin: barrier ring Output: drops of serosanginous leaking out of bottom of pouch as it had not been closed. Also, some leaking from where the skin barrier and pouch snap together because it was not completely snapped in place; and, almost the entire barrier portion had been cut out.  So I elected to remove everything and place a whole new system. Ostomy pouching: 2pc. 2 and 3/4 inches with barrier ring. Education provided: I explained everything I was doing to the patient, fully understanding he may not remember any of what I said as he had literally just arrived to his room. Enrolled patient in [place] [facility] Discharge program: No  Val Riles, RN, MSN, [facility], CNS-BC, pager [phone]

## 2021-03-10 NOTE — Consult Note (Signed)
Patient received pre-operative stomal marking by NP K. Sanders in the [facility] on 03/03/21. Val Riles, RN, MSN, CWOCN, CNS-BC, pager [phone]

## 2021-03-10 NOTE — Anesthesia Preprocedure Evaluation (Signed)
Anesthesia Evaluation  Patient identified by MRN, date of birth, ID band Patient awake    Reviewed: Allergy & Precautions, NPO status , Patient's Chart, lab work & pertinent test results  Airway Mallampati: II  TM Distance: >3 FB Neck ROM: Full    Dental no notable dental hx.    Pulmonary neg pulmonary ROS, former smoker,    Pulmonary exam normal breath sounds clear to auscultation       Cardiovascular negative cardio ROS Normal cardiovascular exam Rhythm:Regular Rate:Normal     Neuro/Psych Anxiety negative neurological ROS  negative psych ROS   GI/Hepatic negative GI ROS, Neg liver ROS,   Endo/Other  negative endocrine ROS  Renal/GU negative Renal ROS  negative genitourinary   Musculoskeletal negative musculoskeletal ROS (+)   Abdominal   Peds negative pediatric ROS (+)  Hematology negative hematology ROS (+)   Anesthesia Other Findings Rectal Cancer  Reproductive/Obstetrics negative OB ROS                             Anesthesia Physical Anesthesia Plan  ASA: 3  Anesthesia Plan: General   Post-op Pain Management:    Induction: Intravenous  PONV Risk Score and Plan: 2 and Ondansetron, Midazolam and Treatment may vary due to age or medical condition  Airway Management Planned: Oral ETT  Additional Equipment:   Intra-op Plan:   Post-operative Plan: Extubation in OR  Informed Consent: I have reviewed the patients History and Physical, chart, labs and discussed the procedure including the risks, benefits and alternatives for the proposed anesthesia with the patient or authorized representative who has indicated his/her understanding and acceptance.     Dental advisory given  Plan Discussed with: CRNA  Anesthesia Plan Comments:         Anesthesia Quick Evaluation

## 2021-03-10 NOTE — Op Note (Signed)
03/10/2021  10:56 AM  PATIENT:  Cesar Herrera  50 y.o. male  Patient Care Team: [name], MD as PCP - General (Internal Medicine) [name], NP as Nurse Practitioner (Nurse Practitioner) [name], MD as Consulting Physician (Oncology)  PRE-OPERATIVE DIAGNOSIS:  rectal cancer  POST-OPERATIVE DIAGNOSIS:  rectal cancer  PROCEDURE:  XI ROBOTIC ASSISTED LOWER ANTERIOR RESECTION WITH LOOP ILEOSTOMY INTRAOPERATIVE ASSESSMENT OF VASCULAR PERFUSION   Surgeon(s): Leighton Ruff, MD Ileana Roup, MD  ASSISTANT: Dr Dema Severin   ANESTHESIA:   local and general  EBL: 3ml Total I/O In: -  Out: 350 [Urine:300; Blood:50]  Delay start of Pharmacological VTE agent (>24hrs) due to surgical blood loss or risk of bleeding:  no  DRAINS: (16F) Jackson-Pratt drain(s) with closed bulb suction in the pelvis    SPECIMEN:  Source of Specimen:  rectosigmoid colon  DISPOSITION OF SPECIMEN:  PATHOLOGY  COUNTS:  YES  PLAN OF CARE: Admit to inpatient   PATIENT DISPOSITION:  PACU - hemodynamically stable.  INDICATION:    50 y.o. M with rectal cancer, s/p neoadjuvant chemoRT.  I recommended low anterior resection:  The anatomy & physiology of the digestive tract was discussed.  The pathophysiology was discussed.  Natural history risks without surgery was discussed.   I worked to give an overview of the disease and the frequent need to have multispecialty involvement.  I feel the risks of no intervention will lead to serious problems that outweigh the operative risks; therefore, I recommended a partial colectomy to remove the pathology.  Laparoscopic & open techniques were discussed.   Risks such as bleeding, infection, abscess, leak, reoperation, possible ostomy, hernia, heart attack, death, and other risks were discussed.  I noted a good likelihood this will help address the problem.   Goals of post-operative recovery were discussed as well.    The patient expressed understanding &  wished to proceed with surgery.  OR FINDINGS:   Patient had small mass in the distal rectum  No obvious metastatic disease on visceral parietal peritoneum or liver.  The anastomosis rests 4 cm from the anal verge by rigid proctoscopy.  DESCRIPTION:   Informed consent was confirmed.  The patient underwent general anaesthesia without difficulty.  The patient was positioned appropriately.  VTE prevention in place.  The patient's abdomen was clipped, prepped, & draped in a sterile fashion.  Surgical timeout confirmed our plan.  The patient was positioned in reverse Trendelenburg.  Abdominal entry was gained using a Varies needle.  Entry was clean.  I induced carbon dioxide insufflation.  An 42mm robotic port was placed in the RUQ.  Camera inspection revealed no injury.  Extra ports were carefully placed under direct laparoscopic visualization.  I laparoscopically reflected the greater omentum and the upper abdomen the small bowel in the upper abdomen. The patient was appropriately positioned and the robot was docked to the patient's left side.  Instruments were placed under direct visualization.    I mobilized the sigmoid colon off of the pelvic sidewall.  I scored the base of peritoneum of the right side of the mesentery of the left colon from the ligament of Treitz to the peritoneal reflection of the mid rectum.  I elevated the sigmoid mesentery and enetered into the retro-mesenteric plane. We were able to identify the left ureter and gonadal vessels. We kept those posterior within the retroperitoneum and elevated the left colon mesentery off that. I did isolated IMA pedicle but did not ligate it yet.  I continued distally and got into the avascular plane posterior to the mesorectum. This allowed me to help mobilize the rectum as well by freeing the mesorectum off the sacrum.  I mobilized the peritoneal coverings towards the peritoneal reflection on both the right and left sides of the rectum.  I could  see the right and left ureters and stayed away from them.    I skeletonized the inferior mesenteric artery pedicle.  I went down to its takeoff from the aorta.  After confirming the left ureter was out of the way, I went ahead and ligated the inferior mesenteric artery pedicle with bipolar robotic vessel sealer ~2cm above its takeoff from the aorta.  We ensured hemostasis. I skeletonized the mesorectum at the junction at the proximal rectum using blunt dissection & bipolar robotic vessel sealer.  I mobilized the left colon in a lateral to medial fashion off the line of T[place]t up towards the splenic flexure to ensure good mobilization of the left colon to reach into the pelvis.  I then began my dissection of the presacral plane using blunt dissection and the robotic vessel sealer.  I continued around laterally on both sides.  I divided the peritoneum down to the level of the peritoneal reflection using the robotic vessel sealer.  I continued my anterior lateral dissection using blunt dissection and the robotic vessel sealer.  Once inside the peritoneal reflection I bluntly divided anteriorly below the level of the prostate.  I continued this dissection to me with my posterior dissection keeping the mesorectal plane intact.  I dissected posteriorly down to the level of the pelvic floor elevating the mesorectum as I went.  Tattoo could be noted in the distal rectum.  I continued my dissection circumferentially until the entire mesorectum was freed down to the level of the pelvic floor.  Once this was complete I evaluated the rectum with a rigid proctoscope.  The mass was noted in the distal rectum on the first valve of [place].  I identified a portion approximately 1 to 2 cm distal to this.  I then dissected the mesorectum at this level using the robotic vessel sealer.  The rectum was then divided using 2 green load 60 mm robotic staplers.  I then divided the mesorectum of the sigmoid colon up to the level of  proximal sigmoid that would reach into the pelvis using a robotic vessel sealer.  We then confirmed good perfusion using an intravascular injection of indocyanine green.  There was good perfusion noted to the small and large bowel up to the level of dissection.  The distal rectum appeared to have adequate perfusion as well.  At this point the robot was undocked and the abdomen was desufflated.  A small Pfannenstiel incision was made and an [place] wound protector was placed.  The colon was brought out through this and transected over a pursestring.  A 2-0 Prolene pursestring was placed.  This was then secured with 3-0 silk sutures.  It was tied tightly around a 31 mm EEA anvil.  This was then placed back into the abdomen.  An anastomosis was created between the distal rectum and remaining colon under direct laparoscopic visualization.  Hemostasis was good at the staple line.  There was no leak when tested with insufflation under irrigation.  Anastomosis rest approximately 4 cm from the anal sphincter complex.  Given the patient's history of radiation and level of the anastomosis, and ileostomy was needed.  We identified the terminal ileum.  This  was adherent to the right pelvic sidewall.  This was dissected free using a laparoscopic Enseal device.  Once this was completed the terminal ileum easily reached to the previously marked ostomy site in the right upper quadrant.  This was brought up and oriented.  A 19 [place] Blake drain was placed in the pelvis and brought out through the right lower quadrant port site incision.  This was secured into place with a 2-0 nylon suture.  The omentum was brought down over the abdominal contents and the robotic instruments were removed.  The [place] and instruments were also removed.  We switched to clean gowns, gloves, instruments and drapes.  The Pfannenstiel incision peritoneum was closed with a running 0 Vicryl suture.  The fascia was closed using running #1 Novafil sutures.   The subcutaneous tissue was reapproximated using a running 2-0 Vicryl suture and the skin was closed with a running 4-0 Vicryl subcuticular suture.  A sterile dressing was applied.  The remaining port sites were closed using interrupted 4-0 Vicryl subcuticular sutures and Dermabond.  At this point the loop ileostomy was matured in standard [place] fashion over a red rubber ostomy bar.  This was done using interrupted 2-0 Vicryl sutures.  An ostomy appliance was placed.  The patient was then awakened from anesthesia and sent to the postanesthesia care unit in stable condition.  All counts were correct per operating room staff.  An MD assistant was necessary for tissue manipulation, retraction and positioning due to the complexity of the case and hospital policies

## 2021-03-10 NOTE — Discharge Instructions (Addendum)
ABDOMINAL SURGERY: POST OP INSTRUCTIONS  DIET: Follow a light bland diet the first 24 hours after arrival home, such as soup, liquids, crackers, etc.  Be sure to include lots of fluids daily.  Avoid fast food or heavy meals as your are more likely to get nauseated.  Do not eat any uncooked fruits or vegetables for the next 2 weeks as your colon heals.  Take your usually prescribed home medications unless otherwise directed. PAIN CONTROL: Pain is best controlled by a usual combination of three different methods TOGETHER: Ice/Heat Over the counter pain medication Prescription pain medication Most patients will experience some swelling and bruising around the incisions.  Ice packs or heating pads (30-60 minutes up to 6 times a day) will help. Use ice for the first few days to help decrease swelling and bruising, then switch to heat to help relax tight/sore spots and speed recovery.  Some people prefer to use ice alone, heat alone, alternating between ice & heat.  Experiment to what works for you.  Swelling and bruising can take several weeks to resolve.   It is helpful to take an over-the-counter pain medication regularly for the first few weeks.  Choose one of the following that works best for you: Naproxen (Aleve, etc)  Two 220mg  tabs twice a day Ibuprofen (Advil, etc) Three 200mg  tabs four times a day (every meal & bedtime) Acetaminophen (Tylenol, etc) 500-650mg  four times a day (every meal & bedtime) A  prescription for pain medication (such as oxycodone, hydrocodone, etc) should be given to you upon discharge.  Take your pain medication as prescribed.  If you are having problems/concerns with the prescription medicine (does not control pain, nausea, vomiting, rash, itching, etc), please call us [phone] to see if we need to switch you to a different pain medicine that will work better for you and/or control your side effect better. If you need a refill on your pain medication, please contact  your pharmacy.  They will contact our office to request authorization. Prescriptions will not be filled after 5 pm or on week-ends.  Avoid getting constipated.  Between the surgery and the pain medications, it is common to experience some constipation.  Increasing fluid intake and taking a fiber supplement (such as Metamucil, Citrucel, FiberCon, MiraLax, etc) 1-2 times a day regularly will usually help prevent this problem from occurring.  A mild laxative (prune juice, Milk of Magnesia, MiraLax, etc) should be taken according to package directions if there are no bowel movements after 48 hours.   Watch out for diarrhea: If your ileostomy output is >1.2L (1200 mL/cc) in 24 hours, please let us know.  Wash / shower every day.  You may shower over the incision / wound.  Avoid baths until the skin is fully healed.  Continue to shower over incision(s) after the dressing is off.  Remove your waterproof bandages 5 days after surgery.  You may leave the incision open to air.  You may replace a dressing/Band-Aid to cover the incision for comfort if you wish.  ACTIVITIES as tolerated:   You may resume regular (light) daily activities beginning the next day--such as daily self-care, walking, climbing stairs--gradually increasing activities as tolerated.  If you can walk 30 minutes without difficulty, it is safe to try more intense activity such as jogging, treadmill, bicycling, low-impact aerobics, swimming, etc. Save the most intensive and strenuous activity for last such as sit-ups, heavy lifting, contact sports, etc  Refrain from any heavy lifting or straining until  you are off narcotics for pain control.   DO NOT PUSH THROUGH PAIN.  Let pain be your guide: If it hurts to do something, don't do it.  Pain is your body warning you to avoid that activity for another week until the pain goes down. You may drive when you are no longer taking prescription pain medication, you can comfortably wear a seatbelt, and you  can safely maneuver your car and apply brakes. You may have sexual intercourse when it is comfortable.   FOLLOW UP in our office Please call [facility] at [phone] to set up an appointment to see your surgeon in the office for a follow-up appointment approximately 1-2 weeks after your surgery. Make sure that you call for this appointment the day you arrive home to insure a convenient appointment time. 10. IF YOU HAVE DISABILITY OR FAMILY LEAVE FORMS, BRING THEM TO THE OFFICE FOR PROCESSING.  DO NOT GIVE THEM TO YOUR DOCTOR.   WHEN TO CALL us [phone]: Poor pain control Reactions / problems with new medications (rash/itching, nausea, etc)  Fever over 101.5 F (38.5 C) Inability to urinate Nausea and/or vomiting Worsening swelling or bruising Continued bleeding from incision. Increased pain, redness, or drainage from the incision  The clinic staff is available to answer your questions during regular business hours (8:30am-5pm).  Please don't hesitate to call and ask to speak to one of our nurses for clinical concerns.   A surgeon from [facility] is always on call at the hospitals   If you have a medical emergency, go to the nearest emergency room or call 911.    [facility], [place], [place], [place], [place]  [address] ? MAIN: [phone] ? TOLL FREE: [phone] ? FAX [phone] [email or web address]

## 2021-03-10 NOTE — H&P (Signed)
The patient is a 50 year old male who presents with colorectal cancer.50 year old male who presented to the office for evaluation of a newly diagnosed rectal cancer a couple months ago.  This was seen on colonoscopy performed due to change in bowel habits and occasional rectal bleeding.  A medium size mass was found in the mid rectum approximately 7 cm from the anal verge.  The mass was tattooed.  Pathology showed adenocarcinoma.  CT scan of chest, abdomen and pelvis showed a hypodense lesion in the right hepatic lobe, approximately 1 cm diameter.  There were also a couple 2 mm nodules in the lung.  MRI showed a mid rectal lesion approximate 6.3 cm from the internal anal sphincter.  It is thought to be T3 N1     Problem List/Past Medical Leighton Ruff, MD; 2/77/4128 11:59 AM) RECTAL CANCER (C20)    Past Surgical History Leighton Ruff, MD; 7/86/7672 11:59 AM) Colon Polyp Removal - Colonoscopy  Oral Surgery  Vasectomy    Diagnostic Studies History Leighton Ruff, MD; 0/94/7096 11:59 AM) Colonoscopy  within last year   Allergies (Jocelyn Bond, CMA; 01/18/2021 11:30 AM) Penicillins  Erythromycin *DERMATOLOGICALS*  Allergies Reconciled    Medication History Leighton Ruff, MD; 2/83/6629 11:59 AM) Multiple Vitamin  (Oral) Active. No Current Medications  (Taken starting 01/18/2021) ALPRAZolam  (0.5MG  Tablet, Oral) Active. Capecitabine  (150MG  Tablet, Oral) Active.    Social History Leighton Ruff, MD; 4/76/5465 11:59 AM) Alcohol use  Moderate alcohol use. Caffeine use  Coffee. No drug use  Tobacco use  Former smoker.   Family History Leighton Ruff, MD; 0/35/4656 11:59 AM) Colon Polyps  Father. Diabetes Mellitus  Father.   Other Problems Leighton Ruff, MD; 04/14/7516 11:59 AM) Back Pain  Rectal Cancer          Review of Systems  General Not Present- Appetite Loss, Chills, Fatigue, Fever, Night Sweats, Weight Gain and Weight Loss. Skin Not Present- Change in Wart/Mole,  Dryness, Hives, Jaundice, New Lesions, Non-Healing Wounds, Rash and Ulcer. HEENT Not Present- Earache, Hearing Loss, Hoarseness, Nose Bleed, Oral Ulcers, Ringing in the Ears, Seasonal Allergies, Sinus Pain, Sore Throat, Visual Disturbances, Wears glasses/contact lenses and Yellow Eyes. Respiratory Not Present- Bloody sputum, Chronic Cough, Difficulty Breathing, Snoring and Wheezing. Breast Not Present- Breast Mass, Breast Pain, Nipple Discharge and Skin Changes. Cardiovascular Not Present- Chest Pain, Difficulty Breathing Lying Down, Leg Cramps, Palpitations, Rapid Heart Rate, Shortness of Breath and Swelling of Extremities. Gastrointestinal Present- Change in Bowel Habits. Not Present- Abdominal Pain, Bloating, Bloody Stool, Chronic diarrhea, Constipation, Difficulty Swallowing, Excessive gas, Gets full quickly at meals, Hemorrhoids, Indigestion, Nausea, Rectal Pain and Vomiting. Male Genitourinary Not Present- Blood in Urine, Change in Urinary Stream, Frequency, Impotence, Nocturia, Painful Urination, Urgency and Urine Leakage. Musculoskeletal Present- Back Pain. Not Present- Joint Pain, Joint Stiffness, Muscle Pain, Muscle Weakness and Swelling of Extremities. Neurological Not Present- Decreased Memory, Fainting, Headaches, Numbness, Seizures, Tingling, Tremor, Trouble walking and Weakness. Psychiatric Not Present- Anxiety, Bipolar, Change in Sleep Pattern, Depression, Fearful and Frequent crying. Endocrine Not Present- Cold Intolerance, Excessive Hunger, Hair Changes, Heat Intolerance and New Diabetes. Hematology Not Present- Blood Thinners, Easy Bruising, Excessive bleeding, Gland problems, HIV and Persistent Infections.     Physical Exam  Today's Vitals   03/10/21 0552  BP: (!) 139/93  Pulse: 73  Resp: 17  Temp: 97.9 F (36.6 C)  TempSrc: Oral  SpO2: 98%   There is no height or weight on file to calculate BMI.    General  Mental Status - Alert. General Appearance - Cooperative.   CV RRR Lungs: CTA Abdomen Palpation/Percussion Palpation and Percussion of the abdomen reveal - Soft.   Rectal Anorectal Exam Internal - normal internal exam and normal sphincter tone.       Assessment & Plan    RECTAL CANCER (C20) Impression: 50 year old male with distal rectal cancer approximately 6 cm from anal verge. He is completed neoadjuvant chemotherapy and radiation. He is ready to proceed with robotic-assisted low anterior resection and diverting ileostomy. We have discussed this in detail including risk of anastomotic leak, sexual side effects and changes in bowel habits after surgery. All questions were answered. Patient completed his radiation on 01/11/2021. The surgery and anatomy were described to the patient as well as the risks of surgery and the possible complications. These include: Bleeding, deep abdominal infections and possible wound complications such as hernia and infection, damage to adjacent structures, leak of surgical connections, which can lead to other surgeries and possibly an ostomy, possible need for other procedures, such as abscess drains in radiology, possible prolonged hospital stay, possible diarrhea from removal of part of the colon, possible constipation from narcotics, possible bowel, bladder or sexual dysfunction if having rectal surgery, prolonged fatigue/weakness or appetite loss, possible early recurrence of of disease, possible complications of their medical problems such as heart disease or arrhythmias or lung problems, death (less than 1%). I believe the patient understands and wishes to proceed with the surgery.

## 2021-03-10 NOTE — Anesthesia Procedure Notes (Signed)
Procedure Name: Intubation Date/Time: 03/10/2021 7:39 AM Performed by: Rosaland Lao, CRNA Pre-anesthesia Checklist: Patient identified, Emergency Drugs available, Suction available and Patient being monitored Patient Re-evaluated:Patient Re-evaluated prior to induction Oxygen Delivery Method: Circle system utilized Preoxygenation: Pre-oxygenation with 100% oxygen Induction Type: IV induction Ventilation: Mask ventilation without difficulty Laryngoscope Size: Miller and 2 Grade View: Grade I Tube type: Oral Tube size: 7.5 mm Number of attempts: 1 Airway Equipment and Method: Stylet Placement Confirmation: ETT inserted through vocal cords under direct vision, positive ETCO2 and breath sounds checked- equal and bilateral Secured at: 22 cm Tube secured with: Tape Dental Injury: Teeth and Oropharynx as per pre-operative assessment

## 2021-03-10 NOTE — Anesthesia Postprocedure Evaluation (Signed)
Anesthesia Post Note  Patient: Cesar Herrera  Procedure(s) Performed: XI ROBOTIC ASSISTED LOWER ANTERIOR RESECTION WITH LOOP ILEOSTOMY (Abdomen)     Patient location during evaluation: PACU Anesthesia Type: General Level of consciousness: awake and alert Pain management: pain level controlled Vital Signs Assessment: post-procedure vital signs reviewed and stable Respiratory status: spontaneous breathing, nonlabored ventilation and respiratory function stable Cardiovascular status: blood pressure returned to baseline and stable Postop Assessment: no apparent nausea or vomiting Anesthetic complications: no   No notable events documented.  Last Vitals:  Vitals:   03/10/21 1215 03/10/21 1240  BP: 121/74 121/74  Pulse: 60 62  Resp: 15 16  Temp: 36.4 C 36.5 C  SpO2: 100% 100%    Last Pain:  Vitals:   03/10/21 1240  TempSrc: Oral  PainSc:                  Lynda Rainwater

## 2021-03-10 NOTE — Transfer of Care (Signed)
Immediate Anesthesia Transfer of Care Note  Patient: Cesar Herrera  Procedure(s) Performed: XI ROBOTIC ASSISTED LOWER ANTERIOR RESECTION WITH LOOP ILEOSTOMY (Abdomen)  Patient Location: PACU  Anesthesia Type:General  Level of Consciousness: sedated  Airway & Oxygen Therapy: Patient Spontanous Breathing and Patient connected to face mask oxygen  Post-op Assessment: Report given to RN and Post -op Vital signs reviewed and stable  Post vital signs: Reviewed and stable  Last Vitals:  Vitals Value Taken Time  BP 117/82 03/10/21 1112  Temp    Pulse    Resp 18 03/10/21 1113  SpO2    Vitals shown include unvalidated device data.  Last Pain:  Vitals:   03/10/21 0552  TempSrc: Oral         Complications: No notable events documented.

## 2021-03-11 ENCOUNTER — Encounter (HOSPITAL_COMMUNITY): Payer: Self-pay | Admitting: General Surgery

## 2021-03-11 DIAGNOSIS — Z932 Ileostomy status: Secondary | ICD-10-CM

## 2021-03-11 LAB — BASIC METABOLIC PANEL
Anion gap: 6 (ref 5–15)
BUN: 5 mg/dL — ABNORMAL LOW (ref 6–20)
CO2: 24 mmol/L (ref 22–32)
Calcium: 8.7 mg/dL — ABNORMAL LOW (ref 8.9–10.3)
Chloride: 108 mmol/L (ref 98–111)
Creatinine, Ser: 0.94 mg/dL (ref 0.61–1.24)
GFR, Estimated: >60 mL/min (ref >60)
Glucose, Bld: 124 mg/dL — ABNORMAL HIGH (ref 70–99)
Potassium: 3.8 mmol/L (ref 3.5–5.1)
Sodium: 138 mmol/L (ref 135–145)

## 2021-03-11 LAB — CBC
HCT: 40.1 % (ref 39.0–52.0)
Hemoglobin: 13.8 g/dL (ref 13.0–17.0)
MCH: 31.4 pg (ref 26.0–34.0)
MCHC: 34.4 g/dL (ref 30.0–36.0)
MCV: 91.3 fL (ref 80.0–100.0)
Platelets: 194 10*3/uL (ref 150–400)
RBC: 4.39 MIL/uL (ref 4.22–5.81)
RDW: 12 % (ref 11.5–15.5)
WBC: 5.8 10*3/uL (ref 4.0–10.5)
nRBC: 0 % (ref 0.0–0.2)

## 2021-03-11 MED ORDER — HYDROCORT-PRAMOXINE (PERIANAL) 2.5-1 % EX CREA: 1.0000 "application " | TOPICAL_CREAM | Freq: Four times a day (QID) | CUTANEOUS | Status: DC | PRN

## 2021-03-11 MED ORDER — SODIUM CHLORIDE 0.9 % IV SOLN: 250.0000 mL | INTRAVENOUS | Status: DC | PRN

## 2021-03-11 MED ORDER — METHOCARBAMOL 1000 MG/10ML IJ SOLN
1000.0000 mg | Freq: Four times a day (QID) | INTRAVENOUS | Status: DC | PRN
  Administered 2021-03-12: 1000 mg via INTRAVENOUS
  Filled 2021-03-11 (×2): qty 10
  Filled 2021-03-11: qty 1000

## 2021-03-11 MED ORDER — MENTHOL 3 MG MT LOZG: 1.0000 | LOZENGE | OROMUCOSAL | Status: DC | PRN

## 2021-03-11 MED ORDER — SIMETHICONE 80 MG PO CHEW
40.0000 mg | CHEWABLE_TABLET | Freq: Four times a day (QID) | ORAL | Status: AC
  Administered 2021-03-11 – 2021-03-13 (×8): 40 mg via ORAL
  Filled 2021-03-11 (×8): qty 1

## 2021-03-11 MED ORDER — ALPRAZOLAM 0.25 MG PO TABS
0.2500 mg | ORAL_TABLET | Freq: Two times a day (BID) | ORAL | Status: DC | PRN
  Administered 2021-03-11: 0.25 mg via ORAL
  Filled 2021-03-11: qty 1

## 2021-03-11 MED ORDER — LIP MEDEX EX OINT
1.0000 "application " | TOPICAL_OINTMENT | Freq: Two times a day (BID) | CUTANEOUS | Status: DC
  Administered 2021-03-11 – 2021-03-15 (×9): 1 via TOPICAL
  Filled 2021-03-11: qty 7

## 2021-03-11 MED ORDER — SODIUM CHLORIDE 0.9% FLUSH
3.0000 mL | Freq: Two times a day (BID) | INTRAVENOUS | Status: DC
  Administered 2021-03-11 – 2021-03-13 (×4): 3 mL via INTRAVENOUS

## 2021-03-11 MED ORDER — ADULT MULTIVITAMIN W/MINERALS CH
1.0000 | ORAL_TABLET | Freq: Every day | ORAL | Status: DC
  Administered 2021-03-11 – 2021-03-15 (×5): 1 via ORAL
  Filled 2021-03-11 (×5): qty 1

## 2021-03-11 MED ORDER — CALCIUM POLYCARBOPHIL 625 MG PO TABS
625.0000 mg | ORAL_TABLET | Freq: Two times a day (BID) | ORAL | Status: DC
  Administered 2021-03-11 – 2021-03-15 (×9): 625 mg via ORAL
  Filled 2021-03-11 (×9): qty 1

## 2021-03-11 MED ORDER — CHLORHEXIDINE GLUCONATE CLOTH 2 % EX PADS
6.0000 | MEDICATED_PAD | Freq: Every day | CUTANEOUS | Status: DC
  Administered 2021-03-11 – 2021-03-15 (×3): 6 via TOPICAL

## 2021-03-11 MED ORDER — FERROUS SULFATE 325 (65 FE) MG PO TABS
325.0000 mg | ORAL_TABLET | Freq: Two times a day (BID) | ORAL | Status: DC
  Administered 2021-03-12: 325 mg via ORAL
  Filled 2021-03-11: qty 1

## 2021-03-11 MED ORDER — FENTANYL CITRATE (PF) 100 MCG/2ML IJ SOLN
25.0000 ug | INTRAMUSCULAR | Status: DC | PRN
  Administered 2021-03-11 – 2021-03-13 (×5): 25 ug via INTRAVENOUS
  Filled 2021-03-11 (×5): qty 2

## 2021-03-11 MED ORDER — LACTATED RINGERS IV BOLUS: 1000.0000 mL | Freq: Three times a day (TID) | INTRAVENOUS | Status: AC | PRN

## 2021-03-11 MED ORDER — SODIUM CHLORIDE 0.9% FLUSH: 3.0000 mL | INTRAVENOUS | Status: DC | PRN

## 2021-03-11 MED ORDER — MAGIC MOUTHWASH
15.0000 mL | Freq: Four times a day (QID) | ORAL | Status: DC | PRN
  Filled 2021-03-11: qty 15

## 2021-03-11 MED ORDER — GABAPENTIN 300 MG PO CAPS
300.0000 mg | ORAL_CAPSULE | Freq: Three times a day (TID) | ORAL | Status: DC
  Administered 2021-03-11 – 2021-03-15 (×12): 300 mg via ORAL
  Filled 2021-03-11 (×12): qty 1

## 2021-03-11 MED ORDER — PHENOL 1.4 % MT LIQD: 2.0000 | OROMUCOSAL | Status: DC | PRN

## 2021-03-11 NOTE — Progress Notes (Signed)
Cesar Herrera [id number] 28-May-1971  CARE TEAM:  PCP: [name], MD  Outpatient Care Team: Patient Care Team: [name], MD as PCP - General (Internal Medicine) [name], NP as Nurse Practitioner (Nurse Practitioner) [name], MD as Consulting Physician (Oncology)  Inpatient Treatment Team: Treatment Team: Attending Provider: Leighton Ruff, MD; [place] Nurse: Meryle Ready, RN; Utilization Review: Claudie Leach, RN; Charge Nurse: Guadlupe Spanish, RN; Social Worker: Lowella Curb, [place]   Problem List:   Active Problems:   Rectal cancer [facility])   1 Day Post-Op  03/10/2021  POST-OPERATIVE DIAGNOSIS:  Rectal cancer   PROCEDURE:   XI ROBOTIC ASSISTED LOWER ANTERIOR RESECTION WITH LOOP ILEOSTOMY INTRAOPERATIVE ASSESSMENT OF VASCULAR PERFUSION    Surgeon(s): Leighton Ruff, MD  OR FINDINGS:    Patient had small mass in the distal rectum   No obvious metastatic disease on visceral parietal peritoneum or liver.   The anastomosis rests 4 cm from the anal verge by rigid proctoscopy.  Assessment  Bloating most likely with expected ileus.  [facility] Stay = 1 days)  Plan:  -Keep on liquids today.  Simethicone.  Nausea control.     Continue acetaminophen and gabapentin and ice/heat for baseline control.  I tried to encourage him to take narcotics more regularly as opposed to waiting until he is in severe pain.  He and his wife agree.  -VTE prophylaxis- SCDs, enoxaparin  -Does have a history of some anxiety.  Can offer Xanax as needed and follow.  -mobilize as tolerated to help recovery  Disposition:  Disposition:  The patient is from: Home  Anticipate discharge to:  Home  Anticipated Date of Discharge is:  January 12,2022    Barriers to discharge:  Pending Clinical improvement (more likely than not)  Patient currently is NOT MEDICALLY STABLE for discharge from the hospital from a surgery standpoint.      30 minutes spent in review,  evaluation, examination, counseling, and coordination of care.   I have reviewed this patient's available data, including medical history, events of note, physical examination and test results as part of my evaluation.  A significant portion of that time was spent in counseling.  Care during the described time interval was provided by me.  I discussed operative findings, updated the patient's status, discussed probable steps to recovery, and gave postoperative recommendations to the patient and spouse.  Recommendations were made.  Questions were answered.  They expressed understanding & appreciation.   03/11/2021    Subjective: (Chief complaint)  Patient walked yesterday.  Bloated and nauseated and crampy this morning.  Just given simethicone.  He was trying to hold off on using narcotics.  Last dose 12 hours ago.  Wife in room.  Nursing just outside room getting ready to give some Dilaudid.  Objective:  Vital signs:  Vitals:   03/10/21 2039 03/11/21 0131 03/11/21 0500 03/11/21 0503  BP: 125/86 111/63  122/77  Pulse: 73 83  74  Resp: 16 16  16   Temp: 97.8 F (36.6 C) 98.5 F (36.9 C)  98.2 F (36.8 C)  TempSrc: Oral Oral  Oral  SpO2: 100% 97%  100%  Weight:   80.6 kg   Height:        Last BM Date: 03/10/21  Intake/Output   Yesterday:  07/08 0701 - 07/09 0700 In: 3438.6 [P.O.:720; I.V.:2718.6] Out: 1308 [Urine:3700; Drains:255; Stool:55; Blood:50] This shift:  No intake/output data recorded.  Bowel function:  Flatus: No  BM:  No  Drain: Serosanguinous   Physical Exam:  General: Pt awake/alert in mild acute distress Eyes: PERRL, normal EOM.  Sclera clear.  No icterus Neuro: CN II-XII intact w/o focal sensory/motor deficits. Lymph: No head/neck/groin lymphadenopathy Psych:  No delerium/psychosis/paranoia.  Oriented x 4 HENT: Normocephalic, Mucus membranes moist.  No thrush Neck: Supple, No tracheal deviation.  No obvious thyromegaly Chest: No pain to  chest wall compression.  Good respiratory excursion.  No audible wheezing CV:  Pulses intact.  Regular rhythm.  No major extremity edema MS: Normal AROM mjr joints.  No obvious deformity  Abdomen: Somewhat firm.  Moderately distended.  Mildly tender at incisions only.  Ileostomy pink with red rubber ring bolster in place.  No flatus nor stool/succus  no evidence of peritonitis.  No incarcerated hernias.  GU: foley in place Ext:   No deformity.  No mjr edema.  No cyanosis Skin: No petechiae / purpurea.  No major sores.  Warm and dry    Results:   Cultures: Recent Results (from the past 720 hour(s))  SARS CORONAVIRUS 2 (TAT 6-24 HRS) Nasopharyngeal Nasopharyngeal Swab     Status: None   Collection Time: 03/08/21 10:09 AM   Specimen: Nasopharyngeal Swab  Result Value Ref Range Status   SARS Coronavirus 2 NEGATIVE NEGATIVE Final    Comment: (NOTE) SARS-CoV-2 target nucleic acids are NOT DETECTED.  The SARS-CoV-2 RNA is generally detectable in upper and lower respiratory specimens during the acute phase of infection. Negative results do not preclude SARS-CoV-2 infection, do not rule out co-infections with other pathogens, and should not be used as the sole basis for treatment or other patient management decisions. Negative results must be combined with clinical observations, patient history, and epidemiological information. The expected result is Negative.  Fact Sheet for Patients: SugarRoll.be  Fact Sheet for Healthcare Providers: https://www.woods-mathews.com/  This test is not yet approved or cleared by the [place] FDA and  has been authorized for detection and/or diagnosis of SARS-CoV-2 by FDA under an Emergency Use Authorization (EUA). This EUA will remain  in effect (meaning this test can be used) for the duration of the COVID-19 declaration under Se ction 564(b)(1) of the Act, 21 U.S.C. section 360bbb-3(b)(1), unless the  authorization is terminated or revoked sooner.  Performed at [facility], [place] [address], [place], [place] [address]     Labs: Results for orders placed or performed during the hospital encounter of 03/10/21 (from the past 48 hour(s))  ABO/Rh     Status: None   Collection Time: 03/10/21  5:42 AM  Result Value Ref Range   ABO/RH(D)      O POS Performed at [facility], [place] [address] [place], [place] [address]   CBC     Status: None   Collection Time: 03/11/21  4:00 AM  Result Value Ref Range   WBC 5.8 4.0 - 10.5 K/uL   RBC 4.39 4.22 - 5.81 MIL/uL   Hemoglobin 13.8 13.0 - 17.0 g/dL   HCT 40.1 39.0 - 52.0 %   MCV 91.3 80.0 - 100.0 fL   MCH 31.4 26.0 - 34.0 pg   MCHC 34.4 30.0 - 36.0 g/dL   RDW 12.0 11.5 - 15.5 %   Platelets 194 150 - 400 K/uL   nRBC 0.0 0.0 - 0.2 %    Comment: Performed at [facility], [place] [address], [place], [place] [address]  Basic metabolic panel     Status: Abnormal  Collection Time: 03/11/21  4:00 AM  Result Value Ref Range   Sodium 138 135 - 145 mmol/L   Potassium 3.8 3.5 - 5.1 mmol/L   Chloride 108 98 - 111 mmol/L   CO2 24 22 - 32 mmol/L   Glucose, Bld 124 (H) 70 - 99 mg/dL    Comment: Glucose reference range applies only to samples taken after fasting for at least 8 hours.   BUN 5 (L) 6 - 20 mg/dL   Creatinine, Ser 0.94 0.61 - 1.24 mg/dL   Calcium 8.7 (L) 8.9 - 10.3 mg/dL   GFR, Estimated >60 >60 mL/min    Comment: (NOTE) Calculated using the CKD-EPI Creatinine Equation (2021)    Anion gap 6 5 - 15    Comment: Performed at [facility], [place] [address] [place], [place] [address]    Imaging / Studies: No results found.  Medications / Allergies: per chart  Antibiotics: Anti-infectives (From admission, onward)    Start     Dose/Rate Route Frequency Ordered Stop   03/10/21 0830  gentamicin (GARAMYCIN) 400 mg in dextrose 5 % 100 mL IVPB  Status:  Discontinued        5  mg/kg  79.8 kg 110 mL/hr over 60 Minutes Intravenous  Once 03/10/21 0717 03/10/21 1230   03/10/21 0830  cefoTEtan (CEFOTAN) 2 g in sodium chloride 0.9 % 100 mL IVPB        2 g 200 mL/hr over 30 Minutes Intravenous  Once 03/10/21 0817 03/10/21 0822   03/10/21 0730  clindamycin (CLEOCIN) IVPB 900 mg  Status:  Discontinued        900 mg 100 mL/hr over 30 Minutes Intravenous  Once 03/10/21 0717 03/10/21 1230   03/10/21 0722  clindamycin (CLEOCIN) 900 MG/50ML IVPB       Note to Pharmacy: Charmayne Sheer   : cabinet override      03/10/21 0722 03/10/21 1929         Note: Portions of this report may have been transcribed using voice recognition software. Every effort was made to ensure accuracy; however, inadvertent computerized transcription errors may be present.   Any transcriptional errors that result from this process are unintentional.    Adin Hector, MD, FACS, MASCRS Esophageal, Gastrointestinal & Colorectal Surgery Robotic and Minimally Invasive Surgery  [facility] [address] [place], [place] [address] [phone] Fax [phone] Main/Paging  CONTACT INFORMATION: Weekday (9AM-5PM) concerns: Call [facility] main office at [phone] Weeknight (5PM-9AM) or Weekend/Holiday concerns: Check [email or web address] for General Surgery [facility] coverage (Please, do not use SecureChat as it is not reliable communication to operating surgeons for immediate patient care)      03/11/2021  9:00 AM

## 2021-03-12 LAB — CBC
HCT: 43.6 % (ref 39.0–52.0)
Hemoglobin: 14.8 g/dL (ref 13.0–17.0)
MCH: 30.7 pg (ref 26.0–34.0)
MCHC: 33.9 g/dL (ref 30.0–36.0)
MCV: 90.5 fL (ref 80.0–100.0)
Platelets: 219 10*3/uL (ref 150–400)
RBC: 4.82 MIL/uL (ref 4.22–5.81)
RDW: 11.9 % (ref 11.5–15.5)
WBC: 8.5 10*3/uL (ref 4.0–10.5)
nRBC: 0 % (ref 0.0–0.2)

## 2021-03-12 LAB — MAGNESIUM: Magnesium: 1.9 mg/dL (ref 1.7–2.4)

## 2021-03-12 LAB — POTASSIUM: Potassium: 3.7 mmol/L (ref 3.5–5.1)

## 2021-03-12 LAB — CREATININE, SERUM
Creatinine, Ser: 1.07 mg/dL (ref 0.61–1.24)
GFR, Estimated: >60 mL/min (ref >60)

## 2021-03-12 NOTE — Progress Notes (Signed)
Pharmacy Brief Note - Alvimopan (Entereg)  The standing order set for alvimopan (Entereg) now includes an automatic order to discontinue the drug after the patient has had a bowel movement. The change was approved by the [place] and the Medical Executive Committee.   This patient has had bowel movements documented by nursing. Therefore, alvimopan has been discontinued. If there are questions, please contact the pharmacy at [phone].   Thank you-  Minda Ditto PharmD 03/12/2021, 2:24 PM

## 2021-03-12 NOTE — Progress Notes (Addendum)
2 Days Post-Op  Subjective: Patient had an episode of emesis last night after taking medications. He reports he then had a large amount of gas and stool from his ostomy overnight and is feeling much better this morning. Total ileostomy output was 1375 over last 24 hours. Vitals stable. WBC normal.   Objective: Vital signs in last 24 hours: Temp:  [98 F (36.7 C)-98.9 F (37.2 C)] 98.9 F (37.2 C) (07/10 0559) Pulse Rate:  [71-80] 80 (07/10 0559) Resp:  [17-18] 18 (07/10 0559) BP: (111-148)/(73-92) 127/92 (07/10 0559) SpO2:  [95 %-100 %] 97 % (07/10 0559) Last BM Date: 03/12/21  Intake/Output from previous day: 07/09 0701 - 07/10 0700 In: 704.4 [P.O.:477; I.V.:227.4] Out: 4272 [Urine:2450; Drains:447; Stool:1375] Intake/Output this shift: Total I/O In: -  Out: 240 [Drains:40; Stool:200]  PE: General: resting comfortably, NAD Neuro: alert and oriented, no focal deficits Resp: normal work of breathing on room air Abdomen: mildly distended but minimally tender, incisions clean and dry. RLQ ileostomy is pink with red rubber catheter in place under the loop, productive of thin stool. JP with serosanguinous drainage. Extremities: warm and well-perfused   Lab Results:  Recent Labs    03/11/21 0400 03/12/21 0405  WBC 5.8 8.5  HGB 13.8 14.8  HCT 40.1 43.6  PLT 194 219   BMET Recent Labs    03/11/21 0400 03/12/21 0405  NA 138  --   K 3.8 3.7  CL 108  --   CO2 24  --   GLUCOSE 124*  --   BUN 5*  --   CREATININE 0.94 1.07  CALCIUM 8.7*  --    PT/INR No results for input(s): LABPROT, INR in the last 72 hours. CMP     Component Value Date/Time   NA 138 03/11/2021 0400   K 3.7 03/12/2021 0405   CL 108 03/11/2021 0400   CO2 24 03/11/2021 0400   GLUCOSE 124 (H) 03/11/2021 0400   BUN 5 (L) 03/11/2021 0400   CREATININE 1.07 03/12/2021 0405   CREATININE 1.08 01/09/2021 1329   CALCIUM 8.7 (L) 03/11/2021 0400   PROT 6.8 01/09/2021 1329   ALBUMIN 4.1 01/09/2021  1329   AST 16 01/09/2021 1329   ALT 15 01/09/2021 1329   ALKPHOS 63 01/09/2021 1329   BILITOT 0.5 01/09/2021 1329   GFRNONAA >60 03/12/2021 0405   GFRNONAA >60 01/09/2021 1329   GFRAA 123 04/30/2008 1125   Lipase  No results found for: LIPASE     Studies/Results: No results found.  Anti-infectives: Anti-infectives (From admission, onward)    Start     Dose/Rate Route Frequency Ordered Stop   03/10/21 0830  gentamicin (GARAMYCIN) 400 mg in dextrose 5 % 100 mL IVPB  Status:  Discontinued        5 mg/kg  79.8 kg 110 mL/hr over 60 Minutes Intravenous  Once 03/10/21 0717 03/10/21 1230   03/10/21 0830  cefoTEtan (CEFOTAN) 2 g in sodium chloride 0.9 % 100 mL IVPB        2 g 200 mL/hr over 30 Minutes Intravenous  Once 03/10/21 0817 03/10/21 0822   03/10/21 0730  clindamycin (CLEOCIN) IVPB 900 mg  Status:  Discontinued        900 mg 100 mL/hr over 30 Minutes Intravenous  Once 03/10/21 0717 03/10/21 1230   03/10/21 0722  clindamycin (CLEOCIN) 900 MG/50ML IVPB       Note to Pharmacy: Charmayne Sheer   : cabinet override  03/10/21 0722 03/10/21 1929        Assessment/Plan  50 yo male with rectal cancer POD2 s/p robotic LAR with diverting loop ileostomy. - Ileostomy functioning, nausea improved this morning. Patient likely has a mild ileus but seems to be improving. Will keep on full liquids this morning as patient is still distended, but can advance to soft diet this afternoon if tolerating with no nausea. - Monitor ileostomy output closely. Output increased significantly, expect this to thicken once patient is on solid foods. Continue fibercon. - If patient has further emesis today will make NPO except sips and obtain KUB - VTE: lovenox, SCDs - Dispo: inpatient, med-surg    LOS: 2 days    Michaelle Birks, MD [facility] Surgery General, Hepatobiliary and Pancreatic Surgery 03/12/21 9:13 AM

## 2021-03-13 ENCOUNTER — Ambulatory Visit
Admission: RE | Admit: 2021-03-13 | Discharge: 2021-03-13 | Disposition: A | Discharge status: Home / Self Care | Payer: BC Managed Care – PPO | Source: Ambulatory Visit | Attending: Radiation Oncology | Admitting: Radiation Oncology

## 2021-03-13 DIAGNOSIS — C20 Malignant neoplasm of rectum: Secondary | ICD-10-CM

## 2021-03-13 LAB — CREATININE, SERUM
Creatinine, Ser: 1.15 mg/dL (ref 0.61–1.24)
GFR, Estimated: >60 mL/min (ref >60)

## 2021-03-13 LAB — POTASSIUM: Potassium: 3.9 mmol/L (ref 3.5–5.1)

## 2021-03-13 MED ORDER — LACTATED RINGERS IV SOLN: INTRAVENOUS | Status: DC

## 2021-03-13 MED ORDER — LOPERAMIDE HCL 2 MG PO CAPS
2.0000 mg | ORAL_CAPSULE | Freq: Three times a day (TID) | ORAL | Status: DC
  Administered 2021-03-13 – 2021-03-15 (×6): 2 mg via ORAL
  Filled 2021-03-13 (×6): qty 1

## 2021-03-13 MED ORDER — LACTATED RINGERS IV BOLUS
1000.0000 mL | Freq: Once | INTRAVENOUS | Status: AC
  Administered 2021-03-13: 1000 mL via INTRAVENOUS

## 2021-03-13 NOTE — Progress Notes (Addendum)
3 Days Post-Op  Subjective: No n/v. Ostomy remains quite productive, thin. Pain well controlled. Ambulating well on his own. Voiding well on his own  Objective: Vital signs in last 24 hours: Temp:  [98 F (36.7 C)-98.9 F (37.2 C)] 98.7 F (37.1 C) (07/11 0533) Pulse Rate:  [78-84] 84 (07/11 0533) Resp:  [18] 18 (07/11 0533) BP: (129-138)/(87-101) 129/87 (07/11 0533) SpO2:  [97 %-99 %] 98 % (07/11 0533) Last BM Date: 03/13/21  Intake/Output from previous day: 07/10 0701 - 07/11 0700 In: 1230 [P.O.:1080; IV Piggyback:150] Out: 4410 [Urine:1400; Drains:185; Stool:2825] Intake/Output this shift: No intake/output data recorded.  PE: General: resting comfortably, NAD Neuro: alert and oriented, no focal deficits Resp: normal work of breathing on room air Abdomen: Abdomen is soft, nondistended. Incisions clean and dry. RLQ ileostomy is pink with red rubber catheter in place under the loop, productive of thin stool. JP with serosanguinous drainage. Extremities: warm and well-perfused   Lab Results:  Recent Labs    03/11/21 0400 03/12/21 0405  WBC 5.8 8.5  HGB 13.8 14.8  HCT 40.1 43.6  PLT 194 219   BMET Recent Labs    03/11/21 0400 03/12/21 0405 03/13/21 0406  NA 138  --   --   K 3.8 3.7 3.9  CL 108  --   --   CO2 24  --   --   GLUCOSE 124*  --   --   BUN 5*  --   --   CREATININE 0.94 1.07 1.15  CALCIUM 8.7*  --   --    PT/INR No results for input(s): LABPROT, INR in the last 72 hours. CMP     Component Value Date/Time   NA 138 03/11/2021 0400   K 3.9 03/13/2021 0406   CL 108 03/11/2021 0400   CO2 24 03/11/2021 0400   GLUCOSE 124 (H) 03/11/2021 0400   BUN 5 (L) 03/11/2021 0400   CREATININE 1.15 03/13/2021 0406   CREATININE 1.08 01/09/2021 1329   CALCIUM 8.7 (L) 03/11/2021 0400   PROT 6.8 01/09/2021 1329   ALBUMIN 4.1 01/09/2021 1329   AST 16 01/09/2021 1329   ALT 15 01/09/2021 1329   ALKPHOS 63 01/09/2021 1329   BILITOT 0.5 01/09/2021 1329    GFRNONAA >60 03/13/2021 0406   GFRNONAA >60 01/09/2021 1329   GFRAA 123 04/30/2008 1125   Lipase  No results found for: LIPASE     Studies/Results: No results found.  Anti-infectives: Anti-infectives (From admission, onward)    Start     Dose/Rate Route Frequency Ordered Stop   03/10/21 0830  gentamicin (GARAMYCIN) 400 mg in dextrose 5 % 100 mL IVPB  Status:  Discontinued        5 mg/kg  79.8 kg 110 mL/hr over 60 Minutes Intravenous  Once 03/10/21 0717 03/10/21 1230   03/10/21 0830  cefoTEtan (CEFOTAN) 2 g in sodium chloride 0.9 % 100 mL IVPB        2 g 200 mL/hr over 30 Minutes Intravenous  Once 03/10/21 0817 03/10/21 0822   03/10/21 0730  clindamycin (CLEOCIN) IVPB 900 mg  Status:  Discontinued        900 mg 100 mL/hr over 30 Minutes Intravenous  Once 03/10/21 0717 03/10/21 1230   03/10/21 0722  clindamycin (CLEOCIN) 900 MG/50ML IVPB       Note to Pharmacy: Charmayne Sheer   : cabinet override      03/10/21 0722 03/10/21 1929  Assessment/Plan  50 yo male with rectal cancer POD3 s/p robotic LAR with diverting loop ileostomy  - Ileostomy functioning well - remains thin liquid, 2.8L in last 24 hrs. Bolus 1L LR then restart MIVF today to help keep up with loses - Monitor ileostomy output closely - continue fibercon; start Imodium - 2 mg before meals - VTE: lovenox, SCDs - Dispo: inpatient, med-surg    LOS: 3 days   Nadeen Landau, MD [facility] Surgery Use [email or web address] to contact on call provider  03/13/21 8:18 AM

## 2021-03-13 NOTE — Consult Note (Signed)
[place] Nurse ostomy follow up Stoma type/location: RUQ; loop ileostomy Patient's wife just left for the day; she really wished to be present for change. We have planned change tomorrow (Tuesday at 1pm).  Patient is very engaged to learn and has been looking on line at videos, manufacturers, etc  Stomal assessment/size: 1 1/4" per previous [place] notes Peristomal assessment: NA Treatment options for stomal/peristomal skin: Using 2" skin barrier ring  Output liquid; green; 2,8L in the last 24 hours Started on Fibercon and Imodium, I discussed the rationale for use and the possible need to continue at DC Ostomy pouching: 2pc. 2 3/4; I have switched into high output pouch for patient's convenience.  He is having trouble emptying into graduate and is not resting well due to high output.  We discussed use for a few days until output thickens a bit.  He understands that if the output thickens soon he will need to switch back to lock and roll closure  Patient got up to the restroom while WOC present and emptied independently into the toilet; I visualized amount of output prior to this.   Education provided: Patient verbalized use of wick to clean spout  Discussed bathing, diet, gas, medication use,dehydration  Discussed food blockage     Enrolled patient in [employer] Discharge program: Yes Provided patient with educational materials Left one additional HOP pouch and 2 2 3/4" lock and roll closure pouch with barrier rings in the patient's room   Wife to come in for 1:00pm teaching session tomorrow at which time we will change pouch.   [place] Nurse will follow along with you for continued support with ostomy teaching and care [place] MSN, RN, [place], [place], [place]

## 2021-03-14 ENCOUNTER — Other Ambulatory Visit: Payer: Self-pay

## 2021-03-14 ENCOUNTER — Other Ambulatory Visit (HOSPITAL_COMMUNITY): Payer: Self-pay

## 2021-03-14 LAB — BASIC METABOLIC PANEL
Anion gap: 6 (ref 5–15)
BUN: 12 mg/dL (ref 6–20)
CO2: 25 mmol/L (ref 22–32)
Calcium: 8.8 mg/dL — ABNORMAL LOW (ref 8.9–10.3)
Chloride: 104 mmol/L (ref 98–111)
Creatinine, Ser: 1 mg/dL (ref 0.61–1.24)
GFR, Estimated: >60 mL/min (ref >60)
Glucose, Bld: 109 mg/dL — ABNORMAL HIGH (ref 70–99)
Potassium: 3.9 mmol/L (ref 3.5–5.1)
Sodium: 135 mmol/L (ref 135–145)

## 2021-03-14 LAB — SURGICAL PATHOLOGY

## 2021-03-14 NOTE — Progress Notes (Signed)
4 Days Post-Op  Subjective: No n/v. Ostomy thickening and volume coming down appropriately on Imodium+Fiber. Pain well controlled.  Objective: Vital signs in last 24 hours: Temp:  [97.9 F (36.6 C)-98.7 F (37.1 C)] 98.7 F (37.1 C) (07/12 0523) Pulse Rate:  [67-81] 69 (07/12 0523) Resp:  [17-18] 18 (07/12 0523) BP: (124-139)/(88-90) 124/90 (07/12 0523) SpO2:  [99 %] 99 % (07/12 0523) Last BM Date: 03/14/21  Intake/Output from previous day: 07/11 0701 - 07/12 0700 In: 4066.8 [P.O.:1200; I.V.:1637.5; IV Piggyback:1229.3] Out: 3400 [Urine:2350; Drains:200; Stool:850] Intake/Output this shift: Total I/O In: 120 [P.O.:120] Out: 630 [Urine:250; Drains:30; Stool:350]  PE: General: resting comfortably, NAD Neuro: alert and oriented, no focal deficits Resp: normal work of breathing on room air Abdomen: Abdomen is soft, nondistended. Incisions clean and dry. RLQ ileostomy is pink with red rubber catheter in place under the loop, productive of thin stool. JP with serosanguinous drainage. Extremities: warm and well-perfused   Lab Results:  Recent Labs    03/12/21 0405  WBC 8.5  HGB 14.8  HCT 43.6  PLT 219   BMET Recent Labs    03/13/21 0406 03/14/21 0406  NA  --  135  K 3.9 3.9  CL  --  104  CO2  --  25  GLUCOSE  --  109*  BUN  --  12  CREATININE 1.15 1.00  CALCIUM  --  8.8*   PT/INR No results for input(s): LABPROT, INR in the last 72 hours. CMP     Component Value Date/Time   NA 135 03/14/2021 0406   K 3.9 03/14/2021 0406   CL 104 03/14/2021 0406   CO2 25 03/14/2021 0406   GLUCOSE 109 (H) 03/14/2021 0406   BUN 12 03/14/2021 0406   CREATININE 1.00 03/14/2021 0406   CREATININE 1.08 01/09/2021 1329   CALCIUM 8.8 (L) 03/14/2021 0406   PROT 6.8 01/09/2021 1329   ALBUMIN 4.1 01/09/2021 1329   AST 16 01/09/2021 1329   ALT 15 01/09/2021 1329   ALKPHOS 63 01/09/2021 1329   BILITOT 0.5 01/09/2021 1329   GFRNONAA >60 03/14/2021 0406   GFRNONAA >60  01/09/2021 1329   GFRAA 123 04/30/2008 1125   Lipase  No results found for: LIPASE     Studies/Results: No results found.  Anti-infectives: Anti-infectives (From admission, onward)    Start     Dose/Rate Route Frequency Ordered Stop   03/10/21 0830  gentamicin (GARAMYCIN) 400 mg in dextrose 5 % 100 mL IVPB  Status:  Discontinued        5 mg/kg  79.8 kg 110 mL/hr over 60 Minutes Intravenous  Once 03/10/21 0717 03/10/21 1230   03/10/21 0830  cefoTEtan (CEFOTAN) 2 g in sodium chloride 0.9 % 100 mL IVPB        2 g 200 mL/hr over 30 Minutes Intravenous  Once 03/10/21 0817 03/10/21 0822   03/10/21 0730  clindamycin (CLEOCIN) IVPB 900 mg  Status:  Discontinued        900 mg 100 mL/hr over 30 Minutes Intravenous  Once 03/10/21 0717 03/10/21 1230   03/10/21 0722  clindamycin (CLEOCIN) 900 MG/50ML IVPB       Note to Pharmacy: Charmayne Sheer   : cabinet override      03/10/21 0722 03/10/21 1929        Assessment/Plan  50 yo male with rectal cancer POD4 s/p robotic LAR with diverting loop ileostomy  - Ileostomy functioning well - thin this morning but volume <1.2L in  last 24 hrs. 1/2 rate MIVF today - Discussed at bedside with his nurse plans for stoma rod removal during WOCN appointment today - Monitor ileostomy output closely - continue fibercon; continue Imodium - 2 mg before meals - VTE: lovenox, SCDs - Dispo: inpatient, med-surg; jp out on day of discharge    LOS: 4 days   Nadeen Landau, MD [facility] Surgery Use [email or web address] to contact on call provider  03/14/21 9:01 AM

## 2021-03-14 NOTE — Consult Note (Signed)
[place] Nurse ostomy follow up Stoma type/location: loop ileostomy upper midline abdomen Stomal assessment/size: 1 3/4" round, budded, pink Peristomal assessment: intact  Treatment options for stomal/peristomal skin:  Output liquid green; output down a bit <1.2 liters Ostomy pouching: 2pc 2 3/4" with 2" skin barrier ring. Using HOP pouch patient preference for short term  Education provided:  Met with patient and his wife today Consulting civil engineer of stoma; loop stoma  Explained stoma characteristics (budded, flush, color, texture, care) Demonstrated pouch change (cutting new skin barrier, measuring stoma, cleaning peristomal skin and stoma, use of barrier ring) Patient independent with emptying  Demonstrated use of wick to clean spout  Discussed bathing, diet, gas, medication use, constipation, diarrhea, dehydration  Discussed food blockage   Discussed risk of peristomal hernia Discussed treatment of peristomal skin (ostomy powder, skin barrier wipes); demonstrated crusting technique due to some  mild reddening of the peristomal skin circumferentially; pattern of the skin barrier ring; may be mild contact dermatitis  Provided patient with [employer] and marked items currently using Answered patient/family questions:   Armed forces logistics/support/administrative officer of supplies Connection with [place] nurse after DC Use of marshmallow to slow output Use of QR codes in handouts Provided high output ileostomy handout; ileostomy diet handout and ostomy clinic handout Provided stelth belt and osteosecrets info.      Enrolled patient in [employer] Discharge program: Yes  Sending home with 4 HOP pouches/6 skin barriers 2 3/4", 8 skin barrier rings, pattern, measuring guide Plans for [place], [place], [place]

## 2021-03-15 ENCOUNTER — Other Ambulatory Visit (HOSPITAL_COMMUNITY): Payer: Self-pay

## 2021-03-15 ENCOUNTER — Telehealth: Payer: Self-pay | Admitting: Hematology

## 2021-03-15 LAB — CREATININE, SERUM
Creatinine, Ser: 0.92 mg/dL (ref 0.61–1.24)
GFR, Estimated: >60 mL/min (ref >60)

## 2021-03-15 MED ORDER — TRAMADOL HCL 50 MG PO TABS
50.0000 mg | ORAL_TABLET | Freq: Four times a day (QID) | ORAL | 0 refills | Status: AC | PRN
  Filled 2021-03-15: qty 10, 3d supply, fill #0

## 2021-03-15 MED ORDER — LOPERAMIDE HCL 2 MG PO CAPS
2.0000 mg | ORAL_CAPSULE | Freq: Three times a day (TID) | ORAL | 1 refills | Status: DC
  Filled 2021-03-15: qty 120, 30d supply, fill #0

## 2021-03-15 MED ORDER — LOPERAMIDE HCL 2 MG PO CAPS: 2.0000 mg | ORAL_CAPSULE | Freq: Three times a day (TID) | ORAL | Status: DC

## 2021-03-15 NOTE — Progress Notes (Signed)
5 Days Post-Op  Subjective: No n/v. Ostomy thickened nicely with chili consistency output on Imodium+Fiber. Pain well controlled.  Objective: Vital signs in last 24 hours: Temp:  [97.6 F (36.4 C)-98.6 F (37 C)] 98.5 F (36.9 C) (07/13 0542) Pulse Rate:  [64-72] 64 (07/13 0542) Resp:  [17-18] 18 (07/13 0542) BP: (120-128)/(79-82) 120/79 (07/13 0542) SpO2:  [99 %-100 %] 99 % (07/13 0542) Last BM Date: 03/15/21  Intake/Output from previous day: 07/12 0701 - 07/13 0700 In: 3342.5 [P.O.:1680; I.V.:1662.5] Out: 4020 [Urine:2300; Drains:120; ZJIRC:7893] Intake/Output this shift: No intake/output data recorded.  PE: General: resting comfortably, NAD Neuro: alert and oriented, no focal deficits Resp: normal work of breathing on room air Abdomen: Abdomen is soft, nondistended. Incisions clean and dry. RLQ ileostomy is pink with red rubber catheter in place under the loop, productive of thin stool. JP with serosanguinous drainage. Extremities: warm and well-perfused   Lab Results:  No results for input(s): WBC, HGB, HCT, PLT in the last 72 hours.  BMET Recent Labs    03/13/21 0406 03/14/21 0406 03/15/21 0406  NA  --  135  --   K 3.9 3.9  --   CL  --  104  --   CO2  --  25  --   GLUCOSE  --  109*  --   BUN  --  12  --   CREATININE 1.15 1.00 0.92  CALCIUM  --  8.8*  --    PT/INR No results for input(s): LABPROT, INR in the last 72 hours. CMP     Component Value Date/Time   NA 135 03/14/2021 0406   K 3.9 03/14/2021 0406   CL 104 03/14/2021 0406   CO2 25 03/14/2021 0406   GLUCOSE 109 (H) 03/14/2021 0406   BUN 12 03/14/2021 0406   CREATININE 0.92 03/15/2021 0406   CREATININE 1.08 01/09/2021 1329   CALCIUM 8.8 (L) 03/14/2021 0406   PROT 6.8 01/09/2021 1329   ALBUMIN 4.1 01/09/2021 1329   AST 16 01/09/2021 1329   ALT 15 01/09/2021 1329   ALKPHOS 63 01/09/2021 1329   BILITOT 0.5 01/09/2021 1329   GFRNONAA >60 03/15/2021 0406   GFRNONAA >60 01/09/2021 1329    GFRAA 123 04/30/2008 1125   Lipase  No results found for: LIPASE     Studies/Results: No results found.  Anti-infectives: Anti-infectives (From admission, onward)    Start     Dose/Rate Route Frequency Ordered Stop   03/10/21 0830  gentamicin (GARAMYCIN) 400 mg in dextrose 5 % 100 mL IVPB  Status:  Discontinued        5 mg/kg  79.8 kg 110 mL/hr over 60 Minutes Intravenous  Once 03/10/21 0717 03/10/21 1230   03/10/21 0830  cefoTEtan (CEFOTAN) 2 g in sodium chloride 0.9 % 100 mL IVPB        2 g 200 mL/hr over 30 Minutes Intravenous  Once 03/10/21 0817 03/10/21 0822   03/10/21 0730  clindamycin (CLEOCIN) IVPB 900 mg  Status:  Discontinued        900 mg 100 mL/hr over 30 Minutes Intravenous  Once 03/10/21 0717 03/10/21 1230   03/10/21 0722  clindamycin (CLEOCIN) 900 MG/50ML IVPB       Note to Pharmacy: Charmayne Sheer   : cabinet override      03/10/21 0722 03/10/21 1929        Assessment/Plan  50 yo male with rectal cancer POD4 s/p robotic LAR with diverting loop ileostomy  - Ileostomy  functioning well - had 1L earlier yesterday but dropped to 600 overnight and has now thickened significantly - D/C JP - Went ahead and increased imodium to 2 mg QID (before meals and at bedtime) - We spent time reviewing his pathology report (ypT1N0) adenocarcinoma. We discussed ileostomy care and monitoring daily output - alerting [place] if >1.2L in 24 hrs so we can stay on top of it and reduce risk of dehydration. Importance of oral rehydration as well. He expresses good understanding of this. - VTE: lovenox, SCDs - Dispo: planning discharge home today    LOS: 5 days   Nadeen Landau, MD [facility] Use [email or web address] to contact on call provider  03/15/21 9:21 AM

## 2021-03-15 NOTE — Consult Note (Signed)
Canute Nurse ostomy follow up Stoma type/location: loop ileostomy; midline upper abdomen Stomal assessment/size: 1 3/4" round, budded, pink Peristomal assessment: NA Treatment options for stomal/peristomal skin:  Output using 2" skin barrier Ostomy pouching: 2pc. 2 3/4" high output pouch in place from change with patient and his wife yesterday Education provided: extending 1.5 hours of ostomy teaching with patient and his wife yesterday. They verbalize they feel secure with DC to home with ostomy care.  They have supplies for DC to home. Re instructed patient on "burping" pouch for gas No other questions at this time. They contacted ostomy clinic yesterday for appointment next week.  I did tell them Santiago Glad would be off until next week and they may not hear from anyone till next week.  Enrolled patient in [facility] Discharge program: Yes  [place], [place], [place]

## 2021-03-15 NOTE — Discharge Summary (Signed)
Patient ID: Cesar Herrera MRN: [id number] DOB/AGE: [birth year] 50 y.o.  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 He had initial high output ileostomy that was managed with fiber and initiation of Imodium. His diet was advanced. On 03/15/2021 he was noted to be doing well - we did increase his imodium but the output had thickened significantly in the prior 18 hrs - now chili consistency. He felt comfortable managing his ostomy. His pathology was discussed with him and his wife. They are comfortable with and he is stable for discharge home.    Allergies as of 03/15/2021       Reactions   Erythromycin    ? Reaction as child   Penicillins    ? Reaction as child        Medication List     TAKE these medications    loperamide 2 MG capsule Commonly known as: IMODIUM Take 1 capsule (2 mg total) by mouth 4 (four) times daily - after meals and at bedtime.   traMADol 50 MG tablet Commonly known as: Ultram Take 1 tablet (50 mg total) by mouth every 6 (six) hours as needed for up to 5 days (postop pain not controlled with tylenol and ibuprofen).       ASK your doctor about these medications    ALPRAZolam 0.25 MG tablet Commonly known as: XANAX Take 1 tablet (0.25 mg total) by mouth 2 (two) times daily as needed for anxiety.   multivitamin tablet Take 1 tablet by mouth  daily.          Follow-up Information     Leighton Ruff, MD. Schedule an appointment as soon as possible for a visit in 2 week(s).   Specialties: General Surgery, Colon and Rectal Surgery Contact information: [place] [address] [phone]                 Sharon Mt. Dema Severin, M.D. [facility]

## 2021-03-15 NOTE — Telephone Encounter (Signed)
Rescheduled upcoming appointment due to provider's breast clinic. Patient is aware of changes. 

## 2021-03-15 NOTE — Progress Notes (Signed)
MR and Mrs Barca both called stating that his biopsy results are on mychart and they were wanting to have Cesar Herrera or Dr Burr Medico call them to explain results.

## 2021-03-15 NOTE — Progress Notes (Signed)
Patient was given discharge instructions, and all questions were answered. Patient was stable for discharge and was walked to the main exit. 

## 2021-03-16 ENCOUNTER — Telehealth: Payer: Self-pay | Admitting: Hematology

## 2021-03-16 ENCOUNTER — Telehealth: Payer: Self-pay

## 2021-03-16 ENCOUNTER — Other Ambulatory Visit: Payer: Self-pay | Admitting: Hematology

## 2021-03-16 MED ORDER — CAPECITABINE 500 MG PO TABS
1000.0000 mg/m2 | ORAL_TABLET | Freq: Two times a day (BID) | ORAL | 0 refills | Status: DC
  Filled 2021-03-16: qty 100, 13d supply, fill #0

## 2021-03-16 NOTE — Telephone Encounter (Signed)
I called pt back. He is recovering well from surgery 03/10/2021. I reviewed his pathology results with him in detail. He had excellent response to chemoRT, he had ypT1N0. I would recommend 3-4 months Xeloda. He is happy with the prognosis and he is scheduled to see me on 03/31/21.   [name]  03/16/2021

## 2021-03-16 NOTE — Telephone Encounter (Signed)
Cesar Herrera and Cesar Herrera are requesting a call to discuss pathology results from his recent surgery  Forwarded to Dr Burr Medico

## 2021-03-17 ENCOUNTER — Other Ambulatory Visit (HOSPITAL_COMMUNITY): Payer: Self-pay

## 2021-03-17 ENCOUNTER — Telehealth: Payer: Self-pay | Admitting: Pharmacist

## 2021-03-17 DIAGNOSIS — C20 Malignant neoplasm of rectum: Secondary | ICD-10-CM

## 2021-03-17 NOTE — Telephone Encounter (Signed)
Oral Oncology Pharmacist Encounter  Received notification from [employer] that prior authorization for Xeloda (capecitabine) is required.   PA submitted on Covermymeds Key [id number] Status is pending   Oral Oncology Clinic will continue to follow.  Leron Croak, PharmD, BCPS Hematology/Oncology Clinical Pharmacist [facility] [phone] 03/17/2021 9:36 AM

## 2021-03-17 NOTE — Telephone Encounter (Signed)
Oral Oncology Pharmacist Encounter  Received new prescription for Xeloda (capecitabine) for the adjuvant treatment of stage IIIB rectal cancer, planned duration 3-4 months.  Prescription dose and frequency assessed for appropriateness.   BMP from 03/14/21 and CBC from 03/12/21 assessed, no dose adjustments required.  Current medication list in Epic reviewed, no relevant/significant DDIs with Xeloda identified.  Evaluated chart and no patient barriers to medication adherence noted.   Prescription has been e-scribed to the [facility] for benefits analysis and approval.  Oral Oncology Clinic will continue to follow for insurance authorization, copayment issues, initial counseling and start date.  Leron Croak, PharmD, BCPS Hematology/Oncology Clinical Pharmacist [facility] [phone] 03/17/2021 9:43 AM

## 2021-03-17 NOTE — Telephone Encounter (Signed)
Oral Oncology Pharmacist Encounter  Prior Authorization for Xeloda (capecitabine) has been approved.    PA# [id number] Effective dates: 03/17/21 through 03/17/22  Oral Oncology Clinic will continue to follow.   Leron Croak, PharmD, BCPS Hematology/Oncology Clinical Pharmacist [facility] [phone] 03/17/2021 12:01 PM

## 2021-03-20 ENCOUNTER — Other Ambulatory Visit (HOSPITAL_COMMUNITY): Payer: Self-pay

## 2021-03-23 DIAGNOSIS — Z85038 Personal history of other malignant neoplasm of large intestine: Secondary | ICD-10-CM | POA: Diagnosis not present

## 2021-03-23 DIAGNOSIS — Z932 Ileostomy status: Secondary | ICD-10-CM | POA: Diagnosis not present

## 2021-03-27 ENCOUNTER — Inpatient Hospital Stay: Payer: BC Managed Care – PPO | Attending: Nurse Practitioner

## 2021-03-27 ENCOUNTER — Telehealth: Payer: Self-pay | Admitting: Hematology

## 2021-03-27 ENCOUNTER — Other Ambulatory Visit: Payer: Self-pay

## 2021-03-27 ENCOUNTER — Ambulatory Visit (HOSPITAL_COMMUNITY)
Admission: RE | Admit: 2021-03-27 | Discharge: 2021-03-27 | Disposition: A | Discharge status: Home / Self Care | Payer: BC Managed Care – PPO | Source: Ambulatory Visit | Attending: Internal Medicine | Admitting: Internal Medicine

## 2021-03-27 ENCOUNTER — Encounter: Payer: Self-pay | Admitting: Hematology

## 2021-03-27 ENCOUNTER — Inpatient Hospital Stay (HOSPITAL_BASED_OUTPATIENT_CLINIC_OR_DEPARTMENT_OTHER): Payer: BC Managed Care – PPO | Admitting: Hematology

## 2021-03-27 VITALS — BP 110/69 | HR 78 | Temp 97.9°F | Resp 20

## 2021-03-27 DIAGNOSIS — D1803 Hemangioma of intra-abdominal structures: Secondary | ICD-10-CM | POA: Insufficient documentation

## 2021-03-27 DIAGNOSIS — C20 Malignant neoplasm of rectum: Secondary | ICD-10-CM | POA: Insufficient documentation

## 2021-03-27 DIAGNOSIS — L24B1 Irritant contact dermatitis related to digestive stoma or fistula: Secondary | ICD-10-CM

## 2021-03-27 DIAGNOSIS — Z432 Encounter for attention to ileostomy: Secondary | ICD-10-CM | POA: Diagnosis not present

## 2021-03-27 DIAGNOSIS — R197 Diarrhea, unspecified: Secondary | ICD-10-CM | POA: Insufficient documentation

## 2021-03-27 LAB — CBC WITH DIFFERENTIAL (CANCER CENTER ONLY)
Abs Immature Granulocytes: 0.01 10*3/uL (ref 0.00–0.07)
Basophils Absolute: 0 10*3/uL (ref 0.0–0.1)
Basophils Relative: 1 %
Eosinophils Absolute: 0.2 10*3/uL (ref 0.0–0.5)
Eosinophils Relative: 5 %
HCT: 41.8 % (ref 39.0–52.0)
Hemoglobin: 14.2 g/dL (ref 13.0–17.0)
Immature Granulocytes: 0 %
Lymphocytes Relative: 11 %
Lymphs Abs: 0.5 10*3/uL — ABNORMAL LOW (ref 0.7–4.0)
MCH: 30 pg (ref 26.0–34.0)
MCHC: 34 g/dL (ref 30.0–36.0)
MCV: 88.4 fL (ref 80.0–100.0)
Monocytes Absolute: 0.4 10*3/uL (ref 0.1–1.0)
Monocytes Relative: 10 %
Neutro Abs: 3 10*3/uL (ref 1.7–7.7)
Neutrophils Relative %: 73 %
Platelet Count: 317 10*3/uL (ref 150–400)
RBC: 4.73 MIL/uL (ref 4.22–5.81)
RDW: 11 % — ABNORMAL LOW (ref 11.5–15.5)
WBC Count: 4.1 10*3/uL (ref 4.0–10.5)
nRBC: 0 % (ref 0.0–0.2)

## 2021-03-27 LAB — IRON AND TIBC
Iron: 72 ug/dL (ref 42–163)
Saturation Ratios: 23 % (ref 20–55)
TIBC: 308 ug/dL (ref 202–409)
UIBC: 235 ug/dL (ref 117–376)

## 2021-03-27 LAB — FERRITIN: Ferritin: 139 ng/mL (ref 24–336)

## 2021-03-27 LAB — CMP (CANCER CENTER ONLY)
ALT: 24 U/L (ref 0–44)
AST: 15 U/L (ref 15–41)
Albumin: 3.5 g/dL (ref 3.5–5.0)
Alkaline Phosphatase: 69 U/L (ref 38–126)
Anion gap: 8 (ref 5–15)
BUN: 10 mg/dL (ref 6–20)
CO2: 28 mmol/L (ref 22–32)
Calcium: 9.9 mg/dL (ref 8.9–10.3)
Chloride: 103 mmol/L (ref 98–111)
Creatinine: 1.08 mg/dL (ref 0.61–1.24)
GFR, Estimated: >60 mL/min (ref >60)
Glucose, Bld: 93 mg/dL (ref 70–99)
Potassium: 4.6 mmol/L (ref 3.5–5.1)
Sodium: 139 mmol/L (ref 135–145)
Total Bilirubin: 0.4 mg/dL (ref 0.3–1.2)
Total Protein: 6.8 g/dL (ref 6.5–8.1)

## 2021-03-27 NOTE — Progress Notes (Signed)
[facility]   Reason for visit:  RMQ ileostomy.  No issues with leaks HPI:  Rectal adenocarcinoma with anterior resection and loop ileostomy ROS  Review of Systems  All other systems reviewed and are negative. Vital signs:  BP 118/73 (BP Location: Left Arm)   Pulse 71   Temp 98 F (36.7 C) (Oral)   Resp 17   Ht '5\' 7"'$  (1.702 m)   Wt 77.1 kg   SpO2 100%   BMI 26.63 kg/m  Exam:  Physical Exam Abdominal:     General: Abdomen is flat. The ostomy site is clean.    Skin:    General: Skin is warm.     Findings: Rash present.     Comments: Peristomal redness.   Neurological:     Mental Status: He is alert.  Psychiatric:        Mood and Affect: Mood normal.    Stoma type/location:  RMQ ileostomy Stomal assessment/size:  1 3/4" round pink and moist Peristomal assessment:  circumferential erythema, new onset Treatment options for stomal/peristomal skin: barrier ring, stoma powder and skin prep Output: soft brown stool.  Was taking immodium and got constipated, relieved with milk of magnesia.   Drinking plenty of fluids, discussed signs/symptoms of blockage and dehydration.  Patient would like to have a salad. I assured him this was ok, but to cut the food well and chew thoroughly.  Ostomy pouching: 2pc. 2 3/4" pouch with barrier ring .  Protect red skin with skin prep and stoma powder.  Education provided:  See above. They have just received shipment of pouches.  Due to redness, I would like to try a 1 piece convex pouch to promote seal.  I apply one and send home with 2 more.  Patient wears a band around abdomen/stoma for support/security.  Does not want to apply an ostomy belt at this time.      Impression/dx  Contact dermatitis Discussion  Protect peristomal skin with stoma powder and skin prep.  Barrier ring and 2 3/4" pouch.   Plan  Call office and let me know how pouch works and if I can order this pouch for them.     Visit time: 50 minutes.   Domenic Moras FNP-BC

## 2021-03-27 NOTE — Telephone Encounter (Signed)
Scheduled appointment per 07/25 sch msg. Patient is aware. 

## 2021-03-27 NOTE — Progress Notes (Signed)
Cesar Herrera   Telephone:([phone] Fax:([phone]   Clinic Follow up Note   Patient Care Team: [name], MD as PCP - General (Internal Medicine) [name], NP as Nurse Practitioner (Nurse Practitioner) [name], MD as Consulting Physician (Oncology)  Date of Service:  03/27/2021  CHIEF COMPLAINT: f/u of rectal cancer  SUMMARY OF ONCOLOGIC HISTORY: Oncology History Overview Note  Cancer Staging Rectal adenocarcinoma [facility]) Staging form: Colon and Rectum, AJCC 8th Edition - Clinical stage from 11/23/2020: Stage IIIB (cT3, cN1, cM0) - Signed by [name], NP on 11/23/2020 Stage prefix: Initial diagnosis    Rectal adenocarcinoma (Cesar Herrera)  11/11/2020 Procedure   Colonoscopy by Dr. Ardis Hughs Impression - Three 2 to 5 mm polyps in the descending colon, in the transverse colon and in the cecum, removed with a cold snare. Complete resection. 2/3 polyps retrieved, sent to pathology. - Rectal tumor that appears malignant, distal edge 7cm from the anal verge. This was biopsied and then labeled with Spot submucosal tattoo. - The examination was otherwise normal on direct and retroflexion views.   11/11/2020 Initial Biopsy   Diagnosis 1. Descending Colon Polyp - TUBULAR ADENOMA (3 OF 3 FRAGMENTS) - NO HIGH-GRADE DYSPLASIA OR MALIGNANCY IDENTIFIED 2. Rectum, biopsy - ADENOCARCINOMA, AT LEAST INTRAMUCOSAL   11/11/2020 Tumor Marker   CEA: 1.4 CA 19-9: 10   11/15/2020 Imaging   Pelvic MRI for local staging IMPRESSION: Signs of potential T3 B disease, tortuosity of the rectum at the level of the tumor makes assessment difficult, distorted by the polypoid eccentric tumor in the high rectum as described.   N1 disease.   No extramural venous invasion. Tumor extends just to the level of the APR in this center below this level.   11/17/2020 Imaging   CT CAP IMPRESSION: 1. Known rectal mass with a 5 mm in short axis left mesorectal lymph node better shown on  the prior exam and only faintly apparent on the CT. 2. 1.4 by 1.2 by 0.9 cm hypodense lesion in the right hepatic lobe on image 50 of series 2, poorly seen on the delayed images. This lesion is indeterminate by CT and could be benign or malignant. Imaging possibilities for with further workup might include hepatic protocol MRI with and without contrast or PET-CT. 3. Lucent lesion of the left posterior L2 vertebral body potentially extending slightly into the pedicle, measuring 1.9 by 1.4 cm on image 116 of series 5. This previously measured 1.6 by 1.3 cm on 03/28/2010, accordingly making it unlikely to be a metastatic lesion. 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 4. Small right middle lobe and right lower lobe pulmonary nodules are likely benign but merit surveillance in this context.   11/18/2020 Initial Diagnosis   Rectal adenocarcinoma (Cesar Herrera)    11/23/2020 Cancer Staging   Staging form: Colon and Rectum, AJCC 8th Edition - Clinical stage from 11/23/2020: Stage IIIB (cT3, cN1, cM0) - Signed by [name], NP on 11/23/2020  Stage prefix: Initial diagnosis    12/02/2020 Imaging   MRI Liver  IMPRESSION: Tiny benign hemangioma in the right hepatic lobe, which corresponds to the lesion seen on recent CT. No evidence of metastatic disease within the liver or abdomen.   12/05/2020 - 01/11/2021 Radiation Therapy   Concurrent chemoradiation with Dr Lisbeth Renshaw    12/05/2020 - 01/11/2021 Chemotherapy   Concurrent chemoradiation with Xeloda 2040m in the AM and 15090min  the PM M-F on days of Radiation   12/23/2020 Genetic Testing   Negative genetic testing:  No pathogenic variants detected on the [employer] CancerNext-Expanded + RNAinsight panel. 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 The report date is 12/23/2020.  The CancerNext-Expanded + RNAinsight gene panel offered by [employer] and includes sequencing and rearrangement analysis for the following 77 genes: AIP, ALK, APC, ATM, AXIN2, BAP1, BARD1, BLM, BMPR1A, BRCA1, BRCA2, BRIP1, CDC73, CDH1, CDK4, CDKN1B, CDKN2A, CHEK2, CTNNA1, DICER1, FANCC, FH, FLCN, GALNT12, KIF1B, LZTR1, MAX, MEN1, MET, MLH1, MSH2, MSH3, MSH6, MUTYH, NBN, NF1, NF2, NTHL1, PALB2, PHOX2B, PMS2, POT1, PRKAR1A, PTCH1, PTEN, RAD51C, RAD51D, RB1, RECQL, RET, SDHA, SDHAF2, SDHB, SDHC, SDHD, SMAD4, SMARCA4, SMARCB1, SMARCE1, STK11, SUFU, TMEM127, TP53, TSC1, TSC2, VHL and XRCC2 (sequencing and deletion/duplication); EGFR, EGLN1, HOXB13, KIT, MITF, PDGFRA, POLD1 and POLE (sequencing only); EPCAM and GREM1 (deletion/duplication only). RNA data is routinely analyzed for use in variant interpretation for all genes.    03/10/2021 Surgery   XI ROBOTIC ASSISTED LOWER ANTERIOR RESECTION WITH LOOP ILEOSTOMY, Dr. [name]  FINAL MICROSCOPIC DIAGNOSIS:   A. COLON, RECTOSIGMOID, RESECTION:  - Invasive adenocarcinoma, moderately differentiated, spanning 2 mm.  - Tumor invades the submucosa.  - Resection margins are negative.  - No carcinoma in thirteen of thirteen lymph nodes (0/13).  - See oncology table.   B. FINAL DISTAL MARGIN:  - Benign colonic mucosa.  - No dysplasia or malignancy.       CURRENT THERAPY:  Adjuvant Xeloda, 1500 mg in the morning/2000 mg in the evening, starting 04/03/21  INTERVAL HISTORY:  Cesar Herrera is here for a follow up of rectal cancer. He was last seen by me on 02/15/21. He presents to the clinic accompanied by his wife. He reports he is recovering well from surgery. His main complaint is pain to the incision site, partly due to the fact that it is located where his pants normally sit. He reports his energy is at about 70% (up from 40-50% last week). He reports some diarrhea, managed with 1-2 imodium each day. He notes some days he didn't have any, because of his diet. He also notes he was recommended to try Metamucil, but this  constipated him, and he had to use milk of magnesium for relief. They are walking as much as they can with the heat.   All other systems were reviewed with the patient and are negative.  MEDICAL HISTORY:  Past Medical History:  Diagnosis Date   Cancer [facility])    Family history of bladder cancer    Family history of lymphoma    H/O epididymitis 2011   Seasonal allergies     SURGICAL HISTORY: Past Surgical History:  Procedure Laterality Date   COLONOSCOPY     VASECTOMY     WISDOM TOOTH EXTRACTION     XI ROBOTIC ASSISTED LOWER ANTERIOR RESECTION N/A 03/10/2021   Procedure: XI ROBOTIC ASSISTED LOWER ANTERIOR RESECTION WITH LOOP ILEOSTOMY;  Surgeon: Leighton Ruff, MD;  Location: [facility];  Service: General;  Laterality: N/A;    I have reviewed the social history and family history with the patient and they are unchanged from previous note.  ALLERGIES:  is allergic to erythromycin and penicillins.  MEDICATIONS:  Current Outpatient Medications  Medication Sig Dispense Refill   ALPRAZolam (XANAX) 0.25 MG tablet Take 1 tablet (0.25 mg total) by mouth 2 (two) times daily as needed for anxiety. (Patient not taking: No sig reported)  30 tablet 0   capecitabine (XELODA) 500 MG tablet Take 4 tablets (2,000 mg total) by mouth 2 (two) times daily after a meal. 14 days on and 7 days off. Pt has 12 tabs at home from previous prescription. 100 tablet 0   loperamide (IMODIUM) 2 MG capsule Take 1 capsule (2 mg total) by mouth 4 (four) times daily - after meals and at bedtime. 120 capsule 1   Multiple Vitamins-Minerals (MULTIVITAMIN) tablet Take 1 tablet by mouth daily. (Patient not taking: No sig reported)     No current facility-administered medications for this visit.   Facility-Administered Medications Ordered in Other Visits  Medication Dose Route Frequency Provider Last Rate Last Admin   gentamicin (GARAMYCIN) 5 mg/kg in dextrose 5 % 50 mL IVPB  5 mg/kg Intravenous 60 min Pre-Op Leighton Ruff, MD         PHYSICAL EXAMINATION: ECOG PERFORMANCE STATUS: 1 - Symptomatic but completely ambulatory  Vitals:   03/27/21 0956  BP: 110/69  Pulse: 78  Resp: 20  Temp: 97.9 F (36.6 C)  SpO2: 100%   There were no vitals filed for this visit.  GENERAL:alert, no distress and comfortable SKIN: skin color, texture, turgor are normal, no rashes or significant lesions EYES: normal, Conjunctiva are pink and non-injected, sclera clear  ABDOMEN:abdomen soft, non-tender and normal bowel sounds; incision healing well Musculoskeletal:no cyanosis of digits and no clubbing  NEURO: alert & oriented x 3 with fluent speech, no focal motor/sensory deficits  LABORATORY DATA:  I have reviewed the data as listed CBC Latest Ref Rng & Units 03/27/2021 03/12/2021 03/11/2021  WBC 4.0 - 10.5 K/uL 4.1 8.5 5.8  Hemoglobin 13.0 - 17.0 g/dL 14.2 14.8 13.8  Hematocrit 39.0 - 52.0 % 41.8 43.6 40.1  Platelets 150 - 400 K/uL 317 219 194     CMP Latest Ref Rng & Units 03/27/2021 03/15/2021 03/14/2021  Glucose 70 - 99 mg/dL 93 - 109(H)  BUN 6 - 20 mg/dL 10 - 12  Creatinine 0.61 - 1.24 mg/dL 1.08 0.92 1.00  Sodium 135 - 145 mmol/L 139 - 135  Potassium 3.5 - 5.1 mmol/L 4.6 - 3.9  Chloride 98 - 111 mmol/L 103 - 104  CO2 22 - 32 mmol/L 28 - 25  Calcium 8.9 - 10.3 mg/dL 9.9 - 8.8(L)  Total Protein 6.5 - 8.1 g/dL 6.8 - -  Total Bilirubin 0.3 - 1.2 mg/dL 0.4 - -  Alkaline Phos 38 - 126 U/L 69 - -  AST 15 - 41 U/L 15 - -  ALT 0 - 44 U/L 24 - -      RADIOGRAPHIC STUDIES: I have personally reviewed the radiological images as listed and agreed with the findings in the report. No results found.   ASSESSMENT & PLAN:  Cesar Herrera is a 50 y.o. male with   1. Adenocarcinoma of the rectum, cT3N0/N1, stage II or III,ypT1N0 -He was diagnosed in 11/2020 by colonoscopy which showed upper/mid rectal adenocarcinoma. -Staging work-up shows 1.4 hypodense lesion in the right lobe, otherwise no evidence of distant metastatic  disease.  Liver MRI showed benign hemangioma in right lobe of liver. -Local staging pelvic MRI shows T3N1 disease, however the node is a single 5 mm mesorectal lymph node and could be reactive, could be N0. I spoke with radiology about this -He completed standard neoadjuvant concurrent chemo/RT with Xeloda 12/05/20-01/11/21.  -He proceeded to resection on 03/10/21 under Dr. [name]. Pathology showed 2 mm residual tumor, all negative lymph nodes (0/13). Loop  ileostomy performed at that time. -Plan to start Xeloda next week, 04/03/21, at 1500 mg in the morning, 2000 mg in the evening. -He is scheduled for f/u with Dr. [name] tomorrow, 03/28/21. -I discussed the risk of cancer recurrence in the future. I discussed the surveillance plan, which is a physical exam and lab test (including CBC, CMP and CEA) every 3 months for the first 2 years, then every 6-12 months, colonoscopy in one year, and surveilliance CT scan every 6-12 month for up to 5 year.     2. Genetics -His genetic testing from 12/23/20 was negative for pathogenetic mutations. 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   No orders of the defined types were placed in this encounter.  All questions were answered. The patient knows to call the clinic with any problems, questions or concerns. No barriers to learning was detected. The total time spent in the appointment was 30 minutes.     [name], MD 03/27/2021   I, Wilburn Mylar, am acting as scribe for [name], MD.   I have reviewed the above documentation for accuracy and completeness, and I agree with the above.

## 2021-03-28 NOTE — Discharge Instructions (Signed)
Call office and let me know if one piece pouch protects skin better.   Will update [facility] for supplies.

## 2021-03-29 ENCOUNTER — Ambulatory Visit: Payer: BC Managed Care – PPO | Admitting: Hematology

## 2021-03-29 ENCOUNTER — Other Ambulatory Visit (HOSPITAL_COMMUNITY): Payer: Self-pay

## 2021-03-29 ENCOUNTER — Other Ambulatory Visit: Payer: BC Managed Care – PPO

## 2021-03-29 NOTE — Telephone Encounter (Addendum)
Oral Chemotherapy Pharmacist Encounter  I spoke with patient for overview of: Xeloda (capecitabine) for the adjuvant treatment of stage IIIB rectal cancer, planned duration 4 months.  Counseled patient on administration, dosing, side effects, monitoring, drug-food interactions, safe handling, storage, and disposal.  Patient will take Xeloda '500mg'$  tablets, 3 tablets (1500) by mouth in AM and 4 tabs (2000) by mouth in PM, within 30 minutes of finishing meals, for 14 days on, 7 days off, repeated every 21 days.   Pending tolerance of first cycle, patient may increase 4 tablets by mouth twice daily, within 30 minutes of finishing meals, for 14 days on, 7 days off, repeated every 21 days with cycle 2.   Xeloda start date: 04/03/21  Adverse effects include but are not limited to: fatigue, decreased blood counts, GI upset, diarrhea, mouth sores, and hand-foot syndrome.  Patient has anti-emetic on hand and knows to take it if nausea develops.   Patient will obtain anti diarrheal and alert the office of 4 or more loose stools above baseline.  Reviewed with patient importance of keeping a medication schedule and plan for any missed doses. No barriers to medication adherence identified.  Medication reconciliation performed and medication/allergy list updated.  This will ship from the [facility] on 03/30/21 to deliver to patient's home on 03/31/21.  Patient informed the pharmacy will reach out 5-7 days prior to needing next fill of Xeloda to coordinate continued medication acquisition to prevent break in therapy.  All questions answered.  Mr. Wragg voiced understanding and appreciation.   Medication education handout placed in mail for patient. Patient knows to call the office with questions or concerns. Oral Chemotherapy Clinic phone number provided to patient.   Leron Croak, PharmD, BCPS Hematology/Oncology Clinical Pharmacist [facility] [phone] 03/30/2021 8:05 AM

## 2021-03-30 ENCOUNTER — Other Ambulatory Visit (HOSPITAL_COMMUNITY): Payer: Self-pay

## 2021-03-30 MED ORDER — CAPECITABINE 500 MG PO TABS
ORAL_TABLET | ORAL | 0 refills | Status: DC
Start: 2021-03-30 — End: 2021-04-07
  Filled 2021-03-30: qty 86, 21d supply, fill #0

## 2021-03-31 ENCOUNTER — Other Ambulatory Visit: Payer: BC Managed Care – PPO

## 2021-03-31 ENCOUNTER — Ambulatory Visit: Payer: BC Managed Care – PPO | Admitting: Hematology

## 2021-04-04 ENCOUNTER — Other Ambulatory Visit (HOSPITAL_COMMUNITY): Payer: Self-pay

## 2021-04-07 ENCOUNTER — Other Ambulatory Visit (HOSPITAL_COMMUNITY): Payer: Self-pay

## 2021-04-07 ENCOUNTER — Other Ambulatory Visit: Payer: Self-pay

## 2021-04-07 ENCOUNTER — Other Ambulatory Visit: Payer: Self-pay | Admitting: Pharmacist

## 2021-04-07 ENCOUNTER — Ambulatory Visit (HOSPITAL_BASED_OUTPATIENT_CLINIC_OR_DEPARTMENT_OTHER): Payer: BC Managed Care – PPO | Admitting: Hematology

## 2021-04-07 DIAGNOSIS — C20 Malignant neoplasm of rectum: Secondary | ICD-10-CM

## 2021-04-07 MED ORDER — CAPECITABINE 500 MG PO TABS
ORAL_TABLET | ORAL | 1 refills | Status: DC
  Filled 2021-04-07: qty 112, fill #0

## 2021-04-07 MED ORDER — CAPECITABINE 500 MG PO TABS
ORAL_TABLET | ORAL | 1 refills | Status: DC
  Filled 2021-04-07: qty 112, fill #0
  Filled 2021-04-17: qty 112, 21d supply, fill #0
  Filled 2021-05-04: qty 112, 21d supply, fill #1

## 2021-04-07 NOTE — Progress Notes (Signed)
[place]   Telephone:([phone]) [phone] Fax:(336) [phone]   Clinic Follow up Note   Patient Care Team: [name], MD as PCP - General (Internal Medicine) [name], NP as Nurse Practitioner (Nurse Practitioner) [name], MD as Consulting Physician (Oncology)  Date of Service:  04/07/2021  I connected with Cesar Herrera on 04/07/2021 at 12:50 PM EDT by telephone visit and verified that I am speaking with the correct person using two identifiers.  I discussed the limitations, risks, security and privacy concerns of performing an evaluation and management service by telephone and the availability of in person appointments. I also discussed with the patient that there may be a patient responsible charge related to this service. The patient expressed understanding and agreed to proceed.   Other persons participating in the visit and their role in the encounter:  none  Patient's location:  home Provider's location:  my office  CHIEF COMPLAINT: f/u of rectal cancer  SUMMARY OF ONCOLOGIC HISTORY: Oncology History Overview Note  Cancer Staging Rectal adenocarcinoma [facility]) Staging form: Colon and Rectum, AJCC 8th Edition - Clinical stage from 11/23/2020: Stage IIIB (cT3, cN1, cM0) - Signed by [name], NP on 11/23/2020 Stage prefix: Initial diagnosis    Rectal adenocarcinoma ([place])  11/11/2020 Procedure   Colonoscopy by Dr. Ardis Hughs Impression - Three 2 to 5 mm polyps in the descending colon, in the transverse colon and in the cecum, removed with a cold snare. Complete resection. 2/3 polyps retrieved, sent to pathology. - Rectal tumor that appears malignant, distal edge 7cm from the anal verge. This was biopsied and then labeled with Spot submucosal tattoo. - The examination was otherwise normal on direct and retroflexion views.   11/11/2020 Initial Biopsy   Diagnosis 1. Descending Colon Polyp - TUBULAR ADENOMA (3 OF 3 FRAGMENTS) - NO HIGH-GRADE DYSPLASIA OR  MALIGNANCY IDENTIFIED 2. Rectum, biopsy - ADENOCARCINOMA, AT LEAST INTRAMUCOSAL   11/11/2020 Tumor Marker   CEA: 1.4 CA 19-9: 10   11/15/2020 Imaging   Pelvic MRI for local staging IMPRESSION: Signs of potential T3 B disease, tortuosity of the rectum at the level of the tumor makes assessment difficult, distorted by the polypoid eccentric tumor in the high rectum as described.   N1 disease.   No extramural venous invasion. Tumor extends just to the level of the APR in this center below this level.   11/17/2020 Imaging   CT CAP IMPRESSION: 1. Known rectal mass with a 5 mm in short axis left mesorectal lymph node better shown on the prior exam and only faintly apparent on the CT. 2. 1.4 by 1.2 by 0.9 cm hypodense lesion in the right hepatic lobe on image 50 of series 2, poorly seen on the delayed images. This lesion is indeterminate by CT and could be benign or malignant. Imaging possibilities for with further workup might include hepatic protocol MRI with and without contrast or PET-CT. 3. Lucent lesion of the left posterior L2 vertebral body potentially extending slightly into the pedicle, measuring 1.9 by 1.4 cm on image 116 of series 5. This previously measured 1.6 by 1.3 cm on 03/28/2010, accordingly making it unlikely to be a metastatic lesion. 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 4. Small right middle lobe and right lower lobe pulmonary nodules are likely benign but merit surveillance in this context.   11/18/2020 Initial Diagnosis   Rectal adenocarcinoma ([place])    11/23/2020 Cancer  Staging   Staging form: Colon and Rectum, AJCC 8th Edition - Clinical stage from 11/23/2020: Stage IIIB (cT3, cN1, cM0) - Signed by [name], NP on 11/23/2020  Stage prefix: Initial diagnosis    12/02/2020 Imaging   MRI Liver  IMPRESSION: Tiny benign hemangioma in the right hepatic lobe, which corresponds to the lesion seen on  recent CT. No evidence of metastatic disease within the liver or abdomen.   12/05/2020 - 01/11/2021 Radiation Therapy   Concurrent chemoradiation with Dr Lisbeth Renshaw    12/05/2020 - 01/11/2021 Chemotherapy   Concurrent chemoradiation with Xeloda 2044m in the AM and 15084min the PM M-F on days of Radiation   12/23/2020 Genetic Testing   Negative genetic testing:  No pathogenic variants detected on the [employer] CancerNext-Expanded + RNAinsight panel. 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 The report date is 12/23/2020.  The CancerNext-Expanded + RNAinsight gene panel offered by [employer] includes sequencing and rearrangement analysis for the following 77 genes: AIP, ALK, APC, ATM, AXIN2, BAP1, BARD1, BLM, BMPR1A, BRCA1, BRCA2, BRIP1, CDC73, CDH1, CDK4, CDKN1B, CDKN2A, CHEK2, CTNNA1, DICER1, FANCC, FH, FLCN, GALNT12, KIF1B, LZTR1, MAX, MEN1, MET, MLH1, MSH2, MSH3, MSH6, MUTYH, NBN, NF1, NF2, NTHL1, PALB2, PHOX2B, PMS2, POT1, PRKAR1A, PTCH1, PTEN, RAD51C, RAD51D, RB1, RECQL, RET, SDHA, SDHAF2, SDHB, SDHC, SDHD, SMAD4, SMARCA4, SMARCB1, SMARCE1, STK11, SUFU, TMEM127, TP53, TSC1, TSC2, VHL and XRCC2 (sequencing and deletion/duplication); EGFR, EGLN1, HOXB13, KIT, MITF, PDGFRA, POLD1 and POLE (sequencing only); EPCAM and GREM1 (deletion/duplication only). RNA data is routinely analyzed for use in variant interpretation for all genes.    03/10/2021 Surgery   XI ROBOTIC ASSISTED LOWER ANTERIOR RESECTION WITH LOOP ILEOSTOMY, Dr. ThMarcello MooresFINAL MICROSCOPIC DIAGNOSIS:   A. COLON, RECTOSIGMOID, RESECTION:  - Invasive adenocarcinoma, moderately differentiated, spanning 2 mm.  - Tumor invades the submucosa.  - Resection margins are negative.  - No carcinoma in thirteen of thirteen lymph nodes (0/13).  - See oncology table.   B. FINAL DISTAL MARGIN:  - Benign colonic mucosa.  - No dysplasia or malignancy.       CURRENT THERAPY:  Adjuvant  Xeloda, 1500 mg in the morning/2000 mg in the evening, starting 04/03/21  INTERVAL HISTORY:  BrPRECILIANO CASTELLas contacted for a follow up of rectal cancer. He was last seen by me on 03/27/21.  He reports no change since starting Xeloda. He continues to recover well from surgery.  He notes he will reach out to the ostomy clinic due to some irritation at his colostomy site. He also notes some mucus to his rectum, which is manageable at this point.   All other systems were reviewed with the patient and are negative.  MEDICAL HISTORY:  Past Medical History:  [facility]osis [facility]   [facility]   Family history of [facility] cancer    Family history of lymphoma    H/O epididymitis 2011   Seasonal allergies     SURGICAL HISTORY: Past Surgical History:  Procedure Laterality Date   COLONOSCOPY     VASECTOMY     WISDOM TOOTH EXTRACTION     XI ROBOTIC ASSISTED LOWER ANTERIOR RESECTION N/A 03/10/2021   Procedure: XI ROBOTIC ASSISTED LOWER ANTERIOR RESECTION WITH LOOP ILEOSTOMY;  Surgeon: ThLeighton RuffMD;  Location: [facility];  Service: General;  Laterality: N/A;    I have reviewed the social history and family history with the patient and they are unchanged from previous note.  ALLERGIES:  is allergic to erythromycin and penicillins.  MEDICATIONS:  Current Outpatient Medications  Medication Sig Dispense Refill   ALPRAZolam (XANAX) 0.25 MG tablet Take 1 tablet (0.25 mg total) by mouth 2 (two) times daily as needed for anxiety. (Patient not taking: No sig reported) 30 tablet 0   capecitabine (XELODA) 500 MG tablet Take 3 tablets (1,500 mg total) by mouth in morning and 4 tablets (2,000 mg total) by mouth in evening. Take within 30 minutes after meals. Take for 14 days on and 7 days off. 86 tablet 0   loperamide (IMODIUM) 2 MG capsule Take 1 capsule (2 mg total) by mouth 4 (four) times daily - after meals and at bedtime. 120 capsule 1   Multiple Vitamins-Minerals (MULTIVITAMIN) tablet Take 1 tablet by  mouth daily. (Patient not taking: No sig reported)     No current facility-administered medications for this visit.   Facility-Administered Medications Ordered in Other Visits  Medication Dose Route Frequency Provider Last Rate Last Admin   gentamicin (GARAMYCIN) 5 mg/kg in dextrose 5 % 50 mL IVPB  5 mg/kg Intravenous 60 min Pre-Op Leighton Ruff, MD        PHYSICAL EXAMINATION: ECOG PERFORMANCE STATUS: 1 - Symptomatic but completely ambulatory  There were no vitals filed for this visit. Wt Readings from Last 3 Encounters:  03/27/21 170 lb (77.1 kg)  03/11/21 177 lb 12.8 oz (80.6 kg)  03/07/21 176 lb (79.8 kg)    No vitals taken today, Exam not performed today  LABORATORY DATA:  I have reviewed the data as listed CBC Latest Ref Rng & Units 03/27/2021 03/12/2021 03/11/2021  WBC 4.0 - 10.5 K/uL 4.1 8.5 5.8  Hemoglobin 13.0 - 17.0 g/dL 14.2 14.8 13.8  Hematocrit 39.0 - 52.0 % 41.8 43.6 40.1  Platelets 150 - 400 K/uL 317 219 194     CMP Latest Ref Rng & Units 03/27/2021 03/15/2021 03/14/2021  Glucose 70 - 99 mg/dL 93 - 109(H)  BUN 6 - 20 mg/dL 10 - 12  Creatinine 0.61 - 1.24 mg/dL 1.08 0.92 1.00  Sodium 135 - 145 mmol/L 139 - 135  Potassium 3.5 - 5.1 mmol/L 4.6 - 3.9  Chloride 98 - 111 mmol/L 103 - 104  CO2 22 - 32 mmol/L 28 - 25  Calcium 8.9 - 10.3 mg/dL 9.9 - 8.8(L)  Total Protein 6.5 - 8.1 g/dL 6.8 - -  Total Bilirubin 0.3 - 1.2 mg/dL 0.4 - -  Alkaline Phos 38 - 126 U/L 69 - -  AST 15 - 41 U/L 15 - -  ALT 0 - 44 U/L 24 - -      RADIOGRAPHIC STUDIES: I have personally reviewed the radiological images as listed and agreed with the findings in the report. No results found.   ASSESSMENT & PLAN:  TORIANO AIKEY is a 50 y.o. male with   1. Adenocarcinoma of the rectum, cT3N0/N1, stage II or III,ypT1N0 -He was diagnosed in 11/2020 by colonoscopy which showed upper/mid rectal adenocarcinoma. -Staging work-up shows 1.4 hypodense lesion in the right lobe, otherwise no evidence  of distant metastatic disease.  Liver MRI showed benign hemangioma in right lobe of liver. -Local staging pelvic MRI shows T3N1 disease, however the node is a single 5 mm mesorectal lymph node and could be reactive, could be N0. I spoke with radiology about this -He completed standard neoadjuvant concurrent chemo/RT with Xeloda 12/05/20-01/11/21.  -He proceeded to resection on 03/10/21 under Dr. [name]. Pathology showed 2 mm residual tumor, all negative lymph  nodes (0/13). Loop ileostomy performed at that time. He last saw Dr. [name] for f/u on 03/28/21. -He began adjuvant Xeloda on 04/03/21, at 1500 mg in the morning, 2000 mg in the evening for fist cycle. He is tolerating well so far.   2. Genetics -His genetic testing from 12/23/20 was negative for pathogenetic mutations. 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   No orders of the defined types were placed in this encounter.  All questions were answered. The patient knows to call the clinic with any problems, questions or concerns. No barriers to learning was detected. The total time spent in the appointment was 15 minutes.     YTruitt Merle MD 04/07/2021   I, KWilburn Mylar am acting as scribe for YTruitt Merle MD.   I have reviewed the above documentation for accuracy and completeness, and I agree with the above.

## 2021-04-08 ENCOUNTER — Encounter: Payer: Self-pay | Admitting: Hematology

## 2021-04-12 DIAGNOSIS — Z932 Ileostomy status: Secondary | ICD-10-CM | POA: Diagnosis not present

## 2021-04-12 DIAGNOSIS — Z85038 Personal history of other malignant neoplasm of large intestine: Secondary | ICD-10-CM | POA: Diagnosis not present

## 2021-04-14 ENCOUNTER — Other Ambulatory Visit (HOSPITAL_COMMUNITY): Payer: Self-pay

## 2021-04-17 ENCOUNTER — Other Ambulatory Visit: Payer: Self-pay

## 2021-04-17 ENCOUNTER — Other Ambulatory Visit (HOSPITAL_COMMUNITY): Payer: Self-pay

## 2021-04-17 ENCOUNTER — Encounter: Payer: Self-pay | Admitting: Hematology

## 2021-04-17 ENCOUNTER — Inpatient Hospital Stay: Payer: BC Managed Care – PPO | Attending: Nurse Practitioner | Admitting: Hematology

## 2021-04-17 ENCOUNTER — Inpatient Hospital Stay: Payer: BC Managed Care – PPO

## 2021-04-17 VITALS — BP 125/79 | HR 75 | Temp 98.2°F | Resp 17 | Ht 67.0 in | Wt 169.3 lb

## 2021-04-17 DIAGNOSIS — C20 Malignant neoplasm of rectum: Secondary | ICD-10-CM

## 2021-04-17 DIAGNOSIS — Z923 Personal history of irradiation: Secondary | ICD-10-CM | POA: Diagnosis not present

## 2021-04-17 DIAGNOSIS — Z9221 Personal history of antineoplastic chemotherapy: Secondary | ICD-10-CM | POA: Diagnosis not present

## 2021-04-17 LAB — CBC WITH DIFFERENTIAL (CANCER CENTER ONLY)
Abs Immature Granulocytes: 0.02 10*3/uL (ref 0.00–0.07)
Basophils Absolute: 0 10*3/uL (ref 0.0–0.1)
Basophils Relative: 0 %
Eosinophils Absolute: 0.1 10*3/uL (ref 0.0–0.5)
Eosinophils Relative: 2 %
HCT: 40.3 % (ref 39.0–52.0)
Hemoglobin: 13.8 g/dL (ref 13.0–17.0)
Immature Granulocytes: 0 %
Lymphocytes Relative: 10 %
Lymphs Abs: 0.5 10*3/uL — ABNORMAL LOW (ref 0.7–4.0)
MCH: 30.3 pg (ref 26.0–34.0)
MCHC: 34.2 g/dL (ref 30.0–36.0)
MCV: 88.6 fL (ref 80.0–100.0)
Monocytes Absolute: 0.4 10*3/uL (ref 0.1–1.0)
Monocytes Relative: 9 %
Neutro Abs: 3.5 10*3/uL (ref 1.7–7.7)
Neutrophils Relative %: 79 %
Platelet Count: 215 10*3/uL (ref 150–400)
RBC: 4.55 MIL/uL (ref 4.22–5.81)
RDW: 13.1 % (ref 11.5–15.5)
WBC Count: 4.5 10*3/uL (ref 4.0–10.5)
nRBC: 0 % (ref 0.0–0.2)

## 2021-04-17 LAB — CMP (CANCER CENTER ONLY)
ALT: 17 U/L (ref 0–44)
AST: 15 U/L (ref 15–41)
Albumin: 3.7 g/dL (ref 3.5–5.0)
Alkaline Phosphatase: 76 U/L (ref 38–126)
Anion gap: 12 (ref 5–15)
BUN: 11 mg/dL (ref 6–20)
CO2: 23 mmol/L (ref 22–32)
Calcium: 9.4 mg/dL (ref 8.9–10.3)
Chloride: 105 mmol/L (ref 98–111)
Creatinine: 1.05 mg/dL (ref 0.61–1.24)
GFR, Estimated: >60 mL/min (ref >60)
Glucose, Bld: 109 mg/dL — ABNORMAL HIGH (ref 70–99)
Potassium: 4.4 mmol/L (ref 3.5–5.1)
Sodium: 140 mmol/L (ref 135–145)
Total Bilirubin: 0.3 mg/dL (ref 0.3–1.2)
Total Protein: 6.9 g/dL (ref 6.5–8.1)

## 2021-04-17 NOTE — Progress Notes (Signed)
[place]   Telephone:(336) [phone] Fax:(336) [phone]   Clinic Follow up Note   Patient Care Team: [name], MD as PCP - General (Internal Medicine) [name], NP as Nurse Practitioner (Nurse Practitioner) [name], MD as Consulting Physician (Oncology)  Date of Service:  04/17/2021  CHIEF COMPLAINT: f/u of rectal cancer  SUMMARY OF ONCOLOGIC HISTORY: Oncology History Overview Note  Cancer Staging Rectal adenocarcinoma [facility]) Staging form: Colon and Rectum, AJCC 8th Edition - Clinical stage from 11/23/2020: Stage IIIB (cT3, cN1, cM0) - Signed by [name], NP on 11/23/2020 Stage prefix: Initial diagnosis    Rectal adenocarcinoma ([place])  11/11/2020 Procedure   Colonoscopy by Dr. Ardis Hughs Impression - Three 2 to 5 mm polyps in the descending colon, in the transverse colon and in the cecum, removed with a cold snare. Complete resection. 2/3 polyps retrieved, sent to pathology. - Rectal tumor that appears malignant, distal edge 7cm from the anal verge. This was biopsied and then labeled with Spot submucosal tattoo. - The examination was otherwise normal on direct and retroflexion views.   11/11/2020 Initial Biopsy   Diagnosis 1. Descending Colon Polyp - TUBULAR ADENOMA (3 OF 3 FRAGMENTS) - NO HIGH-GRADE DYSPLASIA OR MALIGNANCY IDENTIFIED 2. Rectum, biopsy - ADENOCARCINOMA, AT LEAST INTRAMUCOSAL   11/11/2020 Tumor Marker   CEA: 1.4 CA 19-9: 10   11/15/2020 Imaging   Pelvic MRI for local staging IMPRESSION: Signs of potential T3 B disease, tortuosity of the rectum at the level of the tumor makes assessment difficult, distorted by the polypoid eccentric tumor in the high rectum as described.   N1 disease.   No extramural venous invasion. Tumor extends just to the level of the APR in this center below this level.   11/17/2020 Imaging   CT CAP IMPRESSION: 1. Known rectal mass with a 5 mm in short axis left mesorectal lymph node better shown on  the prior exam and only faintly apparent on the CT. 2. 1.4 by 1.2 by 0.9 cm hypodense lesion in the right hepatic lobe on image 50 of series 2, poorly seen on the delayed images. This lesion is indeterminate by CT and could be benign or malignant. Imaging possibilities for with further workup might include hepatic protocol MRI with and without contrast or PET-CT. 3. Lucent lesion of the left posterior L2 vertebral body potentially extending slightly into the pedicle, measuring 1.9 by 1.4 cm on image 116 of series 5. This previously measured 1.6 by 1.3 cm on 03/28/2010, accordingly making it unlikely to be a metastatic lesion. 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 4. Small right middle lobe and right lower lobe pulmonary nodules are likely benign but merit surveillance in this context.   11/18/2020 Initial Diagnosis   Rectal adenocarcinoma ([place])   11/23/2020 Cancer Staging   Staging form: Colon and Rectum, AJCC 8th Edition - Clinical stage from 11/23/2020: Stage IIIB (cT3, cN1, cM0) - Signed by [name], NP on 11/23/2020 Stage prefix: Initial diagnosis   12/02/2020 Imaging   MRI Liver  IMPRESSION: Tiny benign hemangioma in the right hepatic lobe, which corresponds to the lesion seen on recent CT. No evidence of metastatic disease within the liver or abdomen.   12/05/2020 - 01/11/2021 Radiation Therapy   Concurrent chemoradiation with Dr Lisbeth Renshaw    12/05/2020 - 01/11/2021 Chemotherapy   Concurrent chemoradiation with Xeloda 2038m in the AM and 15052min the PM M-F  on days of Radiation   12/23/2020 Genetic Testing   Negative genetic testing:  No pathogenic variants detected on the [employer] CancerNext-Expanded + RNAinsight panel. 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 The report date is 12/23/2020.  The CancerNext-Expanded + RNAinsight gene panel offered by [employer]  and includes sequencing and rearrangement analysis for the following 77 genes: AIP, ALK, APC, ATM, AXIN2, BAP1, BARD1, BLM, BMPR1A, BRCA1, BRCA2, BRIP1, CDC73, CDH1, CDK4, CDKN1B, CDKN2A, CHEK2, CTNNA1, DICER1, FANCC, FH, FLCN, GALNT12, KIF1B, LZTR1, MAX, MEN1, MET, MLH1, MSH2, MSH3, MSH6, MUTYH, NBN, NF1, NF2, NTHL1, PALB2, PHOX2B, PMS2, POT1, PRKAR1A, PTCH1, PTEN, RAD51C, RAD51D, RB1, RECQL, RET, SDHA, SDHAF2, SDHB, SDHC, SDHD, SMAD4, SMARCA4, SMARCB1, SMARCE1, STK11, SUFU, TMEM127, TP53, TSC1, TSC2, VHL and XRCC2 (sequencing and deletion/duplication); EGFR, EGLN1, HOXB13, KIT, MITF, PDGFRA, POLD1 and POLE (sequencing only); EPCAM and GREM1 (deletion/duplication only). RNA data is routinely analyzed for use in variant interpretation for all genes.    03/10/2021 Surgery   XI ROBOTIC ASSISTED LOWER ANTERIOR RESECTION WITH LOOP ILEOSTOMY, Dr. [name]  FINAL MICROSCOPIC DIAGNOSIS:   A. COLON, RECTOSIGMOID, RESECTION:  - Invasive adenocarcinoma, moderately differentiated, spanning 2 mm.  - Tumor invades the submucosa.  - Resection margins are negative.  - No carcinoma in thirteen of thirteen lymph nodes (0/13).  - See oncology table.   B. FINAL DISTAL MARGIN:  - Benign colonic mucosa.  - No dysplasia or malignancy.       CURRENT THERAPY:  Adjuvant Xeloda, 1500 mg in the morning/2000 mg in the evening, starting 04/03/21; increase to 2000 mg BID 04/24/21  INTERVAL HISTORY:  Cesar Herrera is here for a follow up of rectal cancer. He was last seen by me on 03/28/21, with a telephone visit on 04/07/21 in the interim. He presents to the clinic alone. He reports doing well overall. He notes most of his issues are related to his ostomy. He notes a buildup of mucus to his rectum over the last two weeks. He reports he was given a suppository but got little relief. He notes it has been painful and notes bowel urgency. He reports he will see Dr. [name] today as well.   All other systems were reviewed with the  patient and are negative.  MEDICAL HISTORY:  Past Medical History:  Diagnosis Date   Cancer [facility])    Family history of bladder cancer    Family history of lymphoma    H/O epididymitis 2011   Seasonal allergies     SURGICAL HISTORY: Past Surgical History:  Procedure Laterality Date   COLONOSCOPY     VASECTOMY     WISDOM TOOTH EXTRACTION     XI ROBOTIC ASSISTED LOWER ANTERIOR RESECTION N/A 03/10/2021   Procedure: XI ROBOTIC ASSISTED LOWER ANTERIOR RESECTION WITH LOOP ILEOSTOMY;  Surgeon: Leighton Ruff, MD;  Location: [facility];  Service: General;  Laterality: N/A;    I have reviewed the social history and family history with the patient and they are unchanged from previous note.  ALLERGIES:  is allergic to erythromycin and penicillins.  MEDICATIONS:  Current Outpatient Medications  Medication Sig Dispense Refill   ALPRAZolam (XANAX) 0.25 MG tablet Take 1 tablet (0.25 mg total) by mouth 2 (two) times daily as needed for anxiety. (Patient not taking: No sig reported) 30 tablet 0   capecitabine (XELODA) 500 MG tablet Take 4 tablets (2,000 mg total) by mouth in morning and 4 tablets (2,000 mg total) by mouth in evening.  Take within 30 minutes after meals. Take for 14 days on and 7 days off. 112 tablet 1   loperamide (IMODIUM) 2 MG capsule Take 1 capsule (2 mg total) by mouth 4 (four) times daily - after meals and at bedtime. 120 capsule 1   Multiple Vitamins-Minerals (MULTIVITAMIN) tablet Take 1 tablet by mouth daily. (Patient not taking: No sig reported)     No current facility-administered medications for this visit.   Facility-Administered Medications Ordered in Other Visits  Medication Dose Route Frequency Provider Last Rate Last Admin   gentamicin (GARAMYCIN) 5 mg/kg in dextrose 5 % 50 mL IVPB  5 mg/kg Intravenous 60 min Pre-Op Leighton Ruff, MD        PHYSICAL EXAMINATION: ECOG PERFORMANCE STATUS: 0 - Asymptomatic  Vitals:   04/17/21 1315  BP: 125/79  Pulse: 75  Resp: 17   Temp: 98.2 F (36.8 C)  SpO2: 100%   Wt Readings from Last 3 Encounters:  04/17/21 169 lb 4.8 oz (76.8 kg)  03/27/21 170 lb (77.1 kg)  03/11/21 177 lb 12.8 oz (80.6 kg)     GENERAL:alert, no distress and comfortable SKIN: skin color normal, no rashes or significant lesions EYES: normal, Conjunctiva are pink and non-injected, sclera clear  NEURO: alert & oriented x 3 with fluent speech  LABORATORY DATA:  I have reviewed the data as listed CBC Latest Ref Rng & Units 04/17/2021 03/27/2021 03/12/2021  WBC 4.0 - 10.5 K/uL 4.5 4.1 8.5  Hemoglobin 13.0 - 17.0 g/dL 13.8 14.2 14.8  Hematocrit 39.0 - 52.0 % 40.3 41.8 43.6  Platelets 150 - 400 K/uL 215 317 219     CMP Latest Ref Rng & Units 04/17/2021 03/27/2021 03/15/2021  Glucose 70 - 99 mg/dL 109(H) 93 -  BUN 6 - 20 mg/dL 11 10 -  Creatinine 0.61 - 1.24 mg/dL 1.05 1.08 0.92  Sodium 135 - 145 mmol/L 140 139 -  Potassium 3.5 - 5.1 mmol/L 4.4 4.6 -  Chloride 98 - 111 mmol/L 105 103 -  CO2 22 - 32 mmol/L 23 28 -  Calcium 8.9 - 10.3 mg/dL 9.4 9.9 -  Total Protein 6.5 - 8.1 g/dL 6.9 6.8 -  Total Bilirubin 0.3 - 1.2 mg/dL 0.3 0.4 -  Alkaline Phos 38 - 126 U/L 76 69 -  AST 15 - 41 U/L 15 15 -  ALT 0 - 44 U/L 17 24 -      RADIOGRAPHIC STUDIES: I have personally reviewed the radiological images as listed and agreed with the findings in the report. No results found.   ASSESSMENT & PLAN:  Cesar Herrera is a 50 y.o. male with   1. Adenocarcinoma of the rectum, cT3N0/N1, stage II or III,ypT1N0 -He was diagnosed in 11/2020 by colonoscopy which showed upper/mid rectal adenocarcinoma. -Staging work-up shows 1.4 hypodense lesion in the right lobe, otherwise no evidence of distant metastatic disease.  Liver MRI showed benign hemangioma in right lobe of liver. -Local staging pelvic MRI shows T3N1 disease, however the node is a single 5 mm mesorectal lymph node and could be reactive, could be N0. I spoke with radiology about this -He completed  standard neoadjuvant concurrent chemo/RT with Xeloda 12/05/20-01/11/21.  -He proceeded to resection on 03/10/21 under Dr. [name]. Pathology showed 2 mm residual tumor, all negative lymph nodes (0/13). Loop ileostomy performed at that time.  -He began adjuvant Xeloda on 04/03/21, at 1500 mg in the morning, 2000 mg in the evening for fist cycle. He is tolerating well so  far. We plan to increase to full dose, 2000 mg q12h for 14 days on and 7 days off from next cycle.  2. Symptom management: bowel urgency -he has had mucus buildup in the rectum for the last two weeks -he reports bowel urgency causing pain -he has tried a suppository with minimal relief. -he is scheduled to meet with Dr. [name] today. We can decide if we need to proceed with scan after their visit.   3. Genetics -His genetic testing from 12/23/20 was negative for pathogenetic mutations. Did have VUS with DICER1 [id number]     PLAN:  -proceed with f/u and exam with Dr. [name] today -start cycle 2 Xeloda with increased dose 2050m q12h for 14 days on 04/24/21, I will refill for him   -labs and f/u on 05/09/21    No problem-specific Assessment & Plan notes found for this encounter.   No orders of the defined types were placed in this encounter.  All questions were answered. The patient knows to call the clinic with any problems, questions or concerns. No barriers to learning was detected. The total time spent in the appointment was 30 minutes.     YTruitt Merle MD 04/17/2021   I, KWilburn Mylar am acting as scribe for YTruitt Merle MD.   I have reviewed the above documentation for accuracy and completeness, and I agree with the above.

## 2021-04-19 ENCOUNTER — Other Ambulatory Visit: Payer: Self-pay | Admitting: General Surgery

## 2021-04-19 DIAGNOSIS — C20 Malignant neoplasm of rectum: Secondary | ICD-10-CM

## 2021-04-21 DIAGNOSIS — Z85038 Personal history of other malignant neoplasm of large intestine: Secondary | ICD-10-CM | POA: Diagnosis not present

## 2021-04-21 DIAGNOSIS — Z932 Ileostomy status: Secondary | ICD-10-CM | POA: Diagnosis not present

## 2021-04-26 ENCOUNTER — Other Ambulatory Visit: Payer: Self-pay | Admitting: General Surgery

## 2021-04-26 DIAGNOSIS — M533 Sacrococcygeal disorders, not elsewhere classified: Secondary | ICD-10-CM

## 2021-05-02 DIAGNOSIS — Z932 Ileostomy status: Secondary | ICD-10-CM | POA: Diagnosis not present

## 2021-05-02 DIAGNOSIS — Z85038 Personal history of other malignant neoplasm of large intestine: Secondary | ICD-10-CM | POA: Diagnosis not present

## 2021-05-04 ENCOUNTER — Other Ambulatory Visit (HOSPITAL_COMMUNITY): Payer: Self-pay

## 2021-05-05 ENCOUNTER — Ambulatory Visit
Admission: RE | Admit: 2021-05-05 | Discharge: 2021-05-05 | Disposition: A | Discharge status: Home / Self Care | Payer: BC Managed Care – PPO | Source: Ambulatory Visit | Attending: General Surgery | Admitting: General Surgery

## 2021-05-05 DIAGNOSIS — C20 Malignant neoplasm of rectum: Secondary | ICD-10-CM

## 2021-05-05 IMAGING — RF DG BE SINGLE CONTRAST
2 series · 15 of 19 positions shown · non-contrast
Comparison: CT abdomen pelvis [DATE]

CLINICAL DATA: Rectal carcinoma with primary resection [DATE].
Ileostomy. Rule out anastomotic leak.

EXAM:
WATER SOLUBLE CONTRAST ENEMA
TECHNIQUE: Rectal catheter was placed. Dilute Gastrografin utilized for the
enema.
FLUOROSCOPY TIME:  Fluoroscopy Time:  2 minutes 6 seconds
Radiation Exposure Index (if provided by the fluoroscopic device):
Number of Acquired Spot Images: 12

[Series 1: one shot · 0.14mm/px · 14 of 18 slices shown]
[im 1/18]
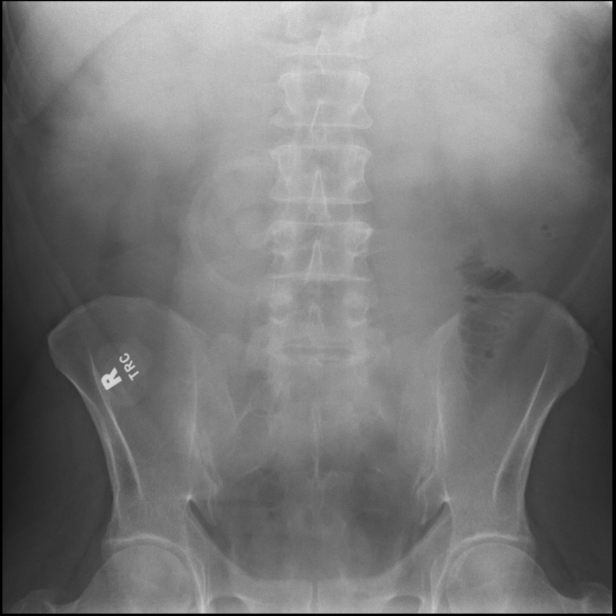
[im 2/18]
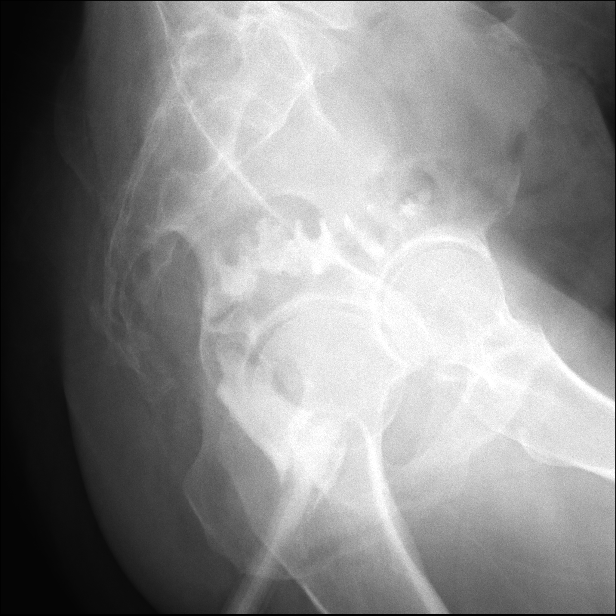
[im 4/18]
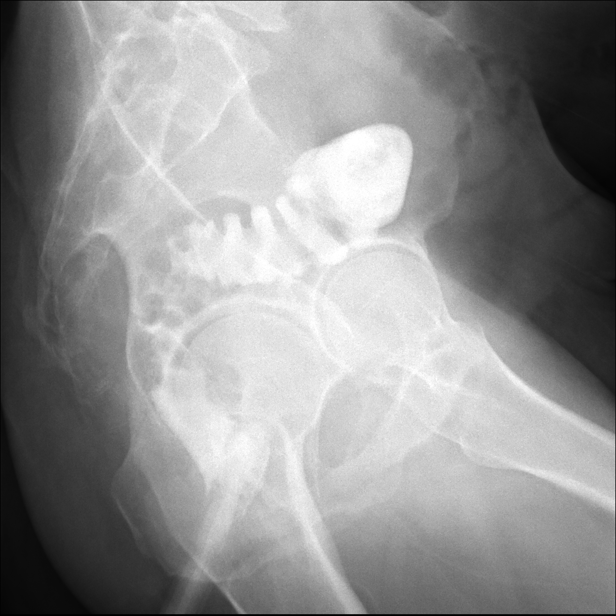
[im 5/18]
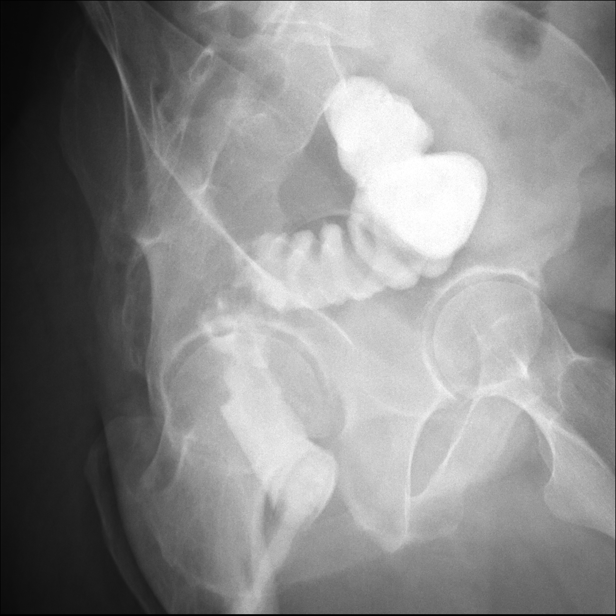
[im 6/18]
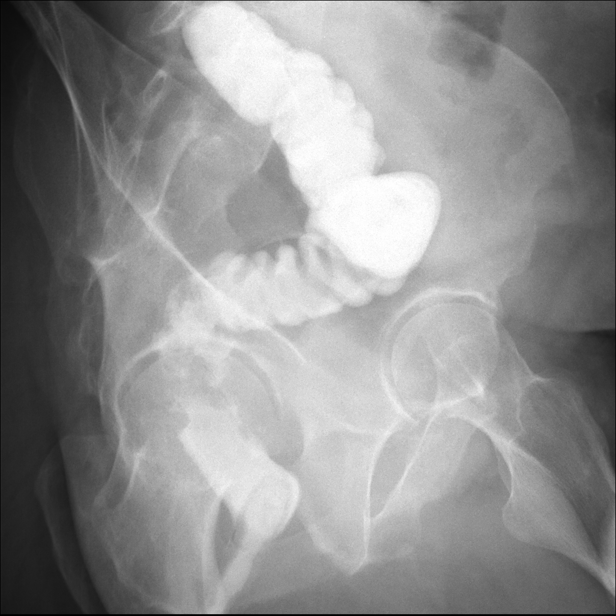
[im 7/18]
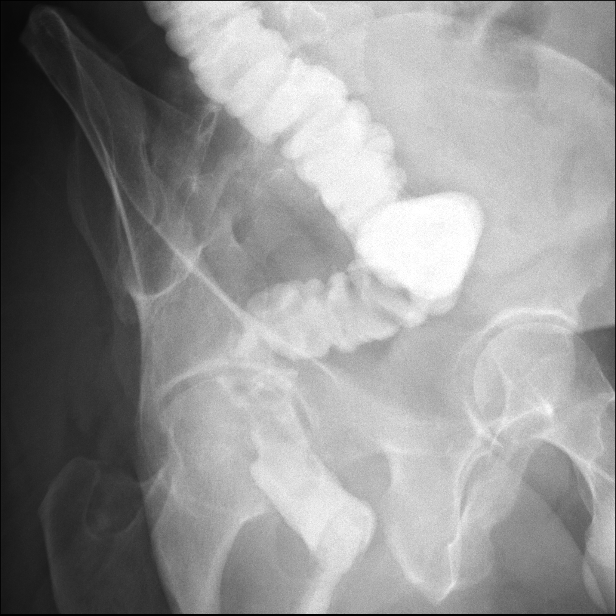
[im 9/18]
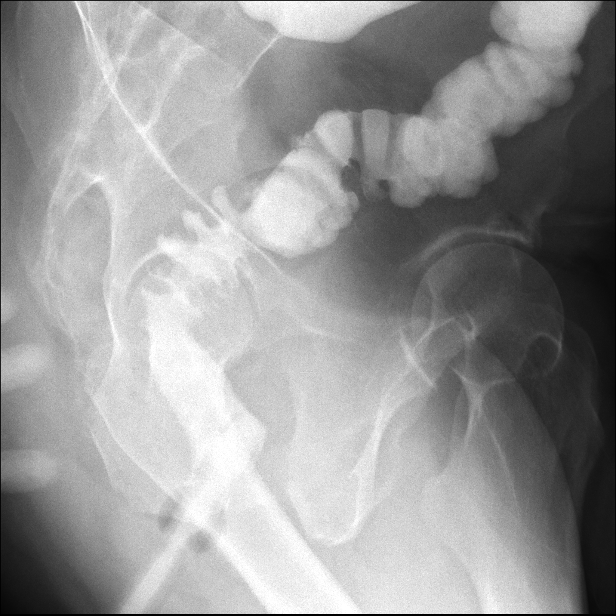
[im 10/18]
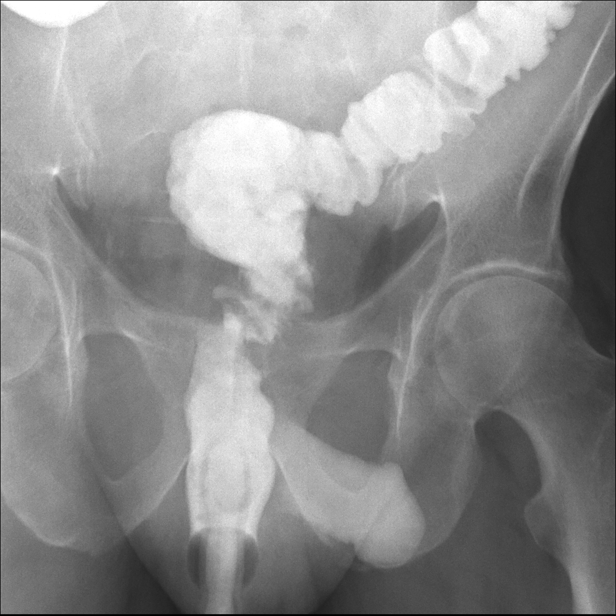
[im 11/18]
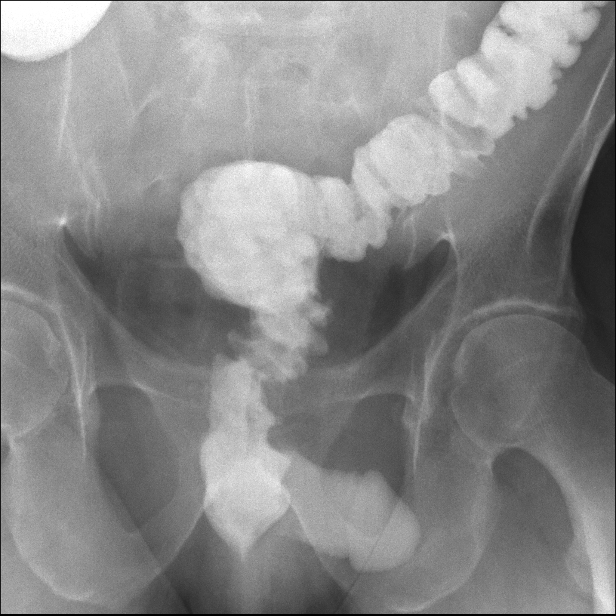
[im 13/18]
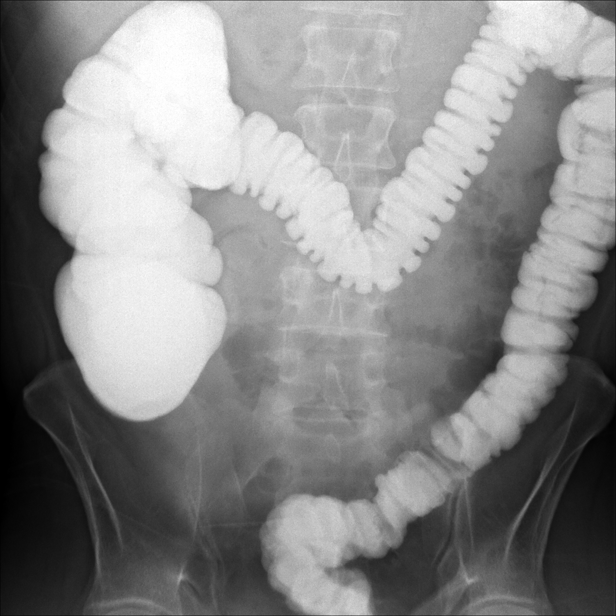
[im 14/18]
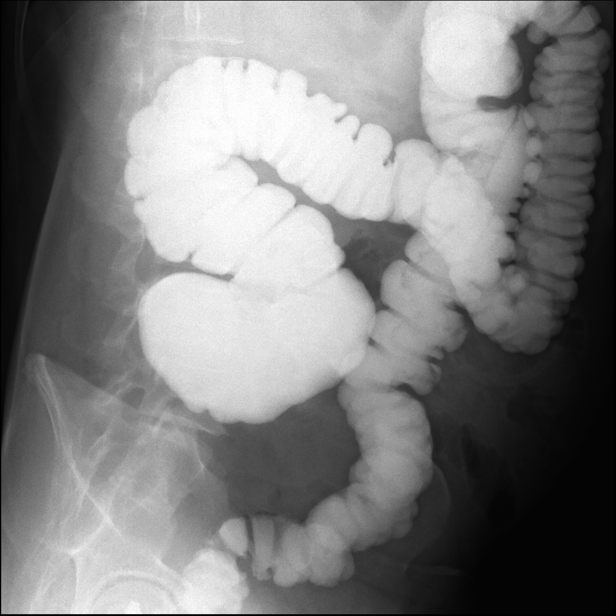
[im 15/18]
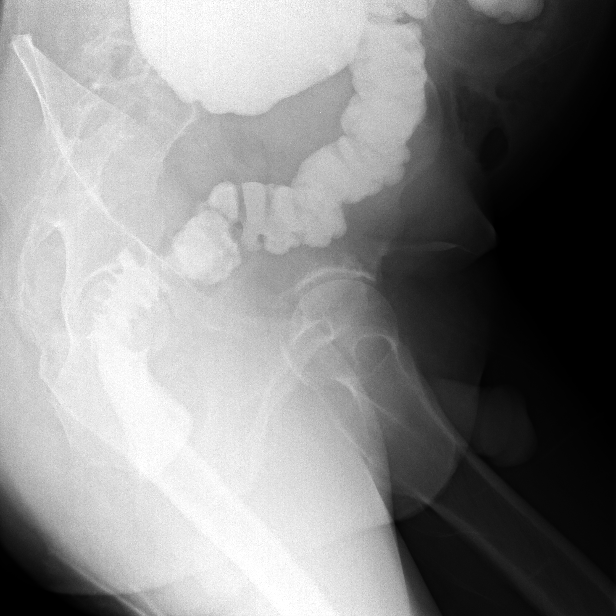
[im 16/18]
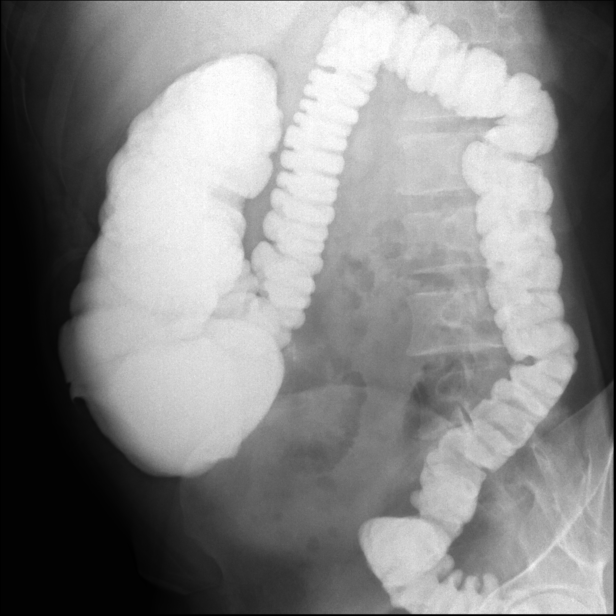
[im 18/18]
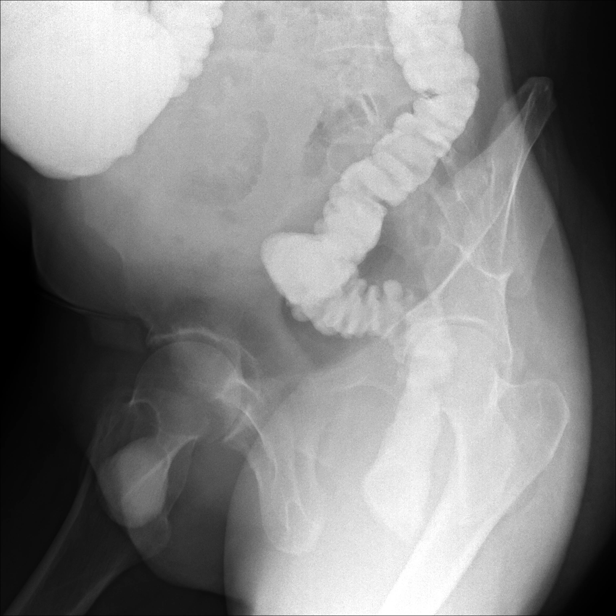

[Series 1: dg abd 1 view · U · 0.14mm/px · 1 of 1 slices shown]
[im 1/1]
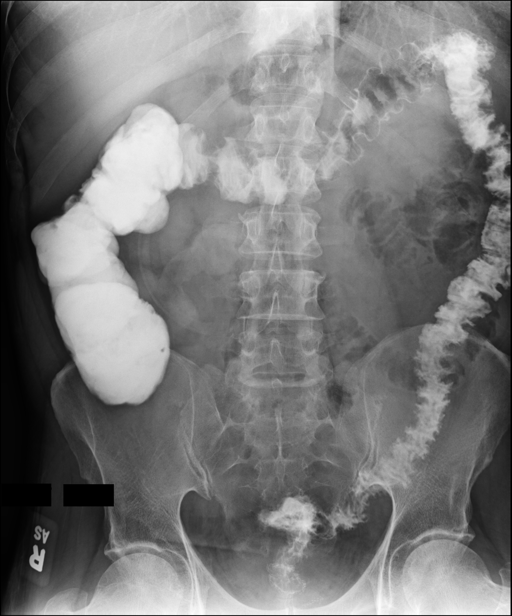

[15 of 19 positions shown; findings below may reference images not displayed]

FINDINGS: Preliminary KUB demonstrates normal bowel gas pattern. Ileostomy
noted.

The patient had some pain while placing the rectal catheter with
tight anal sphincter. Contrast was allowed to flow into the rectum
and filled the remainder of the colon. Patient continued to have
pain during most of the study the catheter was subsequently removed
after opacification of the cecum.

The rectal anastomosis is widely patent without stricture or
narrowing. No leak identified. No mass lesion.

The remainder of the colon shows normal mucosa without mass or
polyp. No significant diverticular change. Stranding density in the
left colon compatible with mucus.
IMPRESSION: Rectal anastomosis widely patent.  No leak.

Remainder of the colon filled with contrast and shows no significant
abnormality.

## 2021-05-09 ENCOUNTER — Other Ambulatory Visit (HOSPITAL_COMMUNITY): Payer: Self-pay

## 2021-05-09 ENCOUNTER — Encounter: Payer: Self-pay | Admitting: Hematology

## 2021-05-09 ENCOUNTER — Other Ambulatory Visit: Payer: Self-pay

## 2021-05-09 ENCOUNTER — Inpatient Hospital Stay: Payer: BC Managed Care – PPO | Attending: Nurse Practitioner | Admitting: Hematology

## 2021-05-09 ENCOUNTER — Inpatient Hospital Stay: Payer: BC Managed Care – PPO

## 2021-05-09 VITALS — BP 125/88 | HR 83 | Temp 98.1°F | Resp 18 | Ht 67.0 in | Wt 168.1 lb

## 2021-05-09 DIAGNOSIS — C20 Malignant neoplasm of rectum: Secondary | ICD-10-CM | POA: Diagnosis not present

## 2021-05-09 DIAGNOSIS — Z79899 Other long term (current) drug therapy: Secondary | ICD-10-CM | POA: Diagnosis not present

## 2021-05-09 DIAGNOSIS — R5383 Other fatigue: Secondary | ICD-10-CM | POA: Insufficient documentation

## 2021-05-09 LAB — CBC WITH DIFFERENTIAL (CANCER CENTER ONLY)
Abs Immature Granulocytes: 0.03 10*3/uL (ref 0.00–0.07)
Basophils Absolute: 0 10*3/uL (ref 0.0–0.1)
Basophils Relative: 0 %
Eosinophils Absolute: 0.1 10*3/uL (ref 0.0–0.5)
Eosinophils Relative: 1 %
HCT: 41.6 % (ref 39.0–52.0)
Hemoglobin: 14.8 g/dL (ref 13.0–17.0)
Immature Granulocytes: 1 %
Lymphocytes Relative: 9 %
Lymphs Abs: 0.5 10*3/uL — ABNORMAL LOW (ref 0.7–4.0)
MCH: 31 pg (ref 26.0–34.0)
MCHC: 35.6 g/dL (ref 30.0–36.0)
MCV: 87.2 fL (ref 80.0–100.0)
Monocytes Absolute: 0.5 10*3/uL (ref 0.1–1.0)
Monocytes Relative: 10 %
Neutro Abs: 4.1 10*3/uL (ref 1.7–7.7)
Neutrophils Relative %: 79 %
Platelet Count: 238 10*3/uL (ref 150–400)
RBC: 4.77 MIL/uL (ref 4.22–5.81)
RDW: 15.5 % (ref 11.5–15.5)
WBC Count: 5.2 10*3/uL (ref 4.0–10.5)
nRBC: 0 % (ref 0.0–0.2)

## 2021-05-09 LAB — CMP (CANCER CENTER ONLY)
ALT: 17 U/L (ref 0–44)
AST: 15 U/L (ref 15–41)
Albumin: 4.1 g/dL (ref 3.5–5.0)
Alkaline Phosphatase: 92 U/L (ref 38–126)
Anion gap: 9 (ref 5–15)
BUN: 11 mg/dL (ref 6–20)
CO2: 23 mmol/L (ref 22–32)
Calcium: 9.5 mg/dL (ref 8.9–10.3)
Chloride: 106 mmol/L (ref 98–111)
Creatinine: 1.19 mg/dL (ref 0.61–1.24)
GFR, Estimated: >60 mL/min (ref >60)
Glucose, Bld: 93 mg/dL (ref 70–99)
Potassium: 4.2 mmol/L (ref 3.5–5.1)
Sodium: 138 mmol/L (ref 135–145)
Total Bilirubin: 0.5 mg/dL (ref 0.3–1.2)
Total Protein: 7.5 g/dL (ref 6.5–8.1)

## 2021-05-09 LAB — IRON AND TIBC
Iron: 67 ug/dL (ref 42–163)
Saturation Ratios: 14 % — ABNORMAL LOW (ref 20–55)
TIBC: 465 ug/dL — ABNORMAL HIGH (ref 202–409)
UIBC: 398 ug/dL — ABNORMAL HIGH (ref 117–376)

## 2021-05-09 LAB — FERRITIN: Ferritin: 42 ng/mL (ref 24–336)

## 2021-05-09 MED ORDER — CAPECITABINE 500 MG PO TABS
ORAL_TABLET | ORAL | 1 refills | Status: DC
  Filled 2021-05-09: qty 112, 21d supply, fill #0
  Filled 2021-06-01: qty 112, 21d supply, fill #1

## 2021-05-09 NOTE — Progress Notes (Signed)
[place]   Telephone:(336) [phone] Fax:(336) [phone]   Clinic Follow up Note   Patient Care Team: [name], MD as PCP - General (Internal Medicine) [name], NP as Nurse Practitioner (Nurse Practitioner) [name], MD as Consulting Physician (Oncology)  Date of Service:  05/09/2021  CHIEF COMPLAINT: f/u of rectal cancer  CURRENT THERAPY:  Adjuvant Xeloda, 1500 mg in the morning/2000 mg in the evening, starting 04/03/21; increase to 2000 mg BID 04/24/21  ASSESSMENT & PLAN:  Cesar Herrera is a 50 y.o. male with   1. Adenocarcinoma of the rectum, cT3N0/N1, stage II or III,ypT1N0 -He was diagnosed in 11/2020 by colonoscopy which showed upper/mid rectal adenocarcinoma. -Staging work-up shows no evidence of distant metastatic disease (and 1.4 cm benign hemangioma in right lobe of liver) -Local staging pelvic MRI shows T3N1 disease, however the node is a single 5 mm mesorectal lymph node and could be reactive, could be N0. I spoke with radiology about this -He completed standard neoadjuvant concurrent chemo/RT with Xeloda 12/05/20-01/11/21.  -He proceeded to resection on 03/10/21 under Dr. [name]. Pathology showed 2 mm residual tumor, all negative lymph nodes (0/13). Loop ileostomy performed at that time.  -He began adjuvant Xeloda on 04/03/21, dose reduced for first cycle. He is now up to full dose, 2000 mg q12h for 14 days on and 7 days off. -he tolerated cycle 2 well with mild fatigue  -plan for a total of 5 cycles    2. Symptom management: bowel urgency -he reports bowel urgency causing pain -he has tried a suppository with minimal relief. -he met with Dr. [name] on 04/17/21 and underwent enema on 05/05/21. He reports some relief since then.   3. Genetics -His genetic testing from 12/23/20 was negative for pathogenetic mutations. Did have VUS with DICER1 [id number]     PLAN:  -continue Xeloda at 2048m q12h, 14 days on/7 days off. Start C3 05/15/21 -labs and f/u in  3 weeks before cycle 4    No problem-specific Assessment & Plan notes found for this encounter.   SUMMARY OF ONCOLOGIC HISTORY: Oncology History Overview Note  Cancer Staging Rectal adenocarcinoma ([facility] Staging form: Colon and Rectum, AJCC 8th Edition - Clinical stage from 11/23/2020: Stage IIIB (cT3, cN1, cM0) - Signed by BAlla Feeling NP on 11/23/2020 Stage prefix: Initial diagnosis    [place]  11/11/[place] Procedure   Colonoscopy by Dr. JArdis HughsImpression - Three 2 to 5 mm polyps in the descending colon, in the transverse colon and in the cecum, removed with a cold snare. Complete resection. 2/3 polyps retrieved, sent to pathology. - Rectal tumor that appears malignant, distal edge 7cm from the anal verge. This was biopsied and then labeled with Spot submucosal tattoo. - The examination was otherwise normal on direct and retroflexion views.   11/11/2020 Initial Biopsy   Diagnosis 1. Descending Colon Polyp - TUBULAR ADENOMA (3 OF 3 FRAGMENTS) - NO HIGH-GRADE DYSPLASIA OR MALIGNANCY IDENTIFIED 2. Rectum, biopsy - ADENOCARCINOMA, AT LEAST INTRAMUCOSAL   11/11/2020 Tumor Marker   CEA: 1.4 CA 19-9: 10   11/15/2020 Imaging   Pelvic MRI for local staging IMPRESSION: Signs of potential T3 B disease, tortuosity of the rectum at the level of the tumor makes assessment difficult, distorted by the polypoid eccentric tumor in the high rectum as described.   N1 disease.   No extramural venous invasion. Tumor extends just to the level of the APR in this center below this level.  11/17/2020 Imaging   CT CAP IMPRESSION: 1. Known rectal mass with a 5 mm in short axis left mesorectal lymph node better shown on the prior exam and only faintly apparent on the CT. 2. 1.4 by 1.2 by 0.9 cm hypodense lesion in the right hepatic lobe on image 50 of series 2, poorly seen on the delayed images. This lesion is indeterminate by CT and could be benign or malignant. Imaging  possibilities for with further workup might include hepatic protocol MRI with and without contrast or PET-CT. 3. Lucent lesion of the left posterior L2 vertebral body potentially extending slightly into the pedicle, measuring 1.9 by 1.4 cm on image 116 of series 5. This previously measured 1.6 by 1.3 cm on 03/28/2010, accordingly making it unlikely to be a metastatic lesion. 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 4. Small right middle lobe and right lower lobe pulmonary nodules are likely benign but merit surveillance in this context.   11/18/2020 Initial Diagnosis   Rectal adenocarcinoma ([place])   11/23/2020 Cancer Staging   Staging form: Colon and Rectum, AJCC 8th Edition - Clinical stage from 11/23/2020: Stage IIIB (cT3, cN1, cM0) - Signed by [name], NP on 11/23/2020 Stage prefix: Initial diagnosis   12/02/2020 Imaging   MRI Liver  IMPRESSION: Tiny benign hemangioma in the right hepatic lobe, which corresponds to the lesion seen on recent CT. No evidence of metastatic disease within the liver or abdomen.   12/05/2020 - 01/11/2021 Radiation Therapy   Concurrent chemoradiation with Dr Lisbeth Renshaw    12/05/2020 - 01/11/2021 Chemotherapy   Concurrent chemoradiation with Xeloda 2063m in the AM and 15075min the PM M-F on days of Radiation   12/23/2020 Genetic Testing   Negative genetic testing:  No pathogenic variants detected on the [employer] CancerNext-Expanded + RNAinsight panel. 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 The report date is 12/23/2020.  The CancerNext-Expanded + RNAinsight gene panel offered by [employer] includes sequencing and rearrangement analysis for the following 77 genes: AIP, ALK, APC, ATM, AXIN2, BAP1, BARD1, BLM, BMPR1A, BRCA1, BRCA2, BRIP1, CDC73, CDH1, CDK4, CDKN1B, CDKN2A, CHEK2, CTNNA1, DICER1, FANCC, FH, FLCN, GALNT12, KIF1B, LZTR1, MAX,  MEN1, MET, MLH1, MSH2, MSH3, MSH6, MUTYH, NBN, NF1, NF2, NTHL1, PALB2, PHOX2B, PMS2, POT1, PRKAR1A, PTCH1, PTEN, RAD51C, RAD51D, RB1, RECQL, RET, SDHA, SDHAF2, SDHB, SDHC, SDHD, SMAD4, SMARCA4, SMARCB1, SMARCE1, STK11, SUFU, TMEM127, TP53, TSC1, TSC2, VHL and XRCC2 (sequencing and deletion/duplication); EGFR, EGLN1, HOXB13, KIT, MITF, PDGFRA, POLD1 and POLE (sequencing only); EPCAM and GREM1 (deletion/duplication only). RNA data is routinely analyzed for use in variant interpretation for all genes.    03/10/2021 Surgery   XI ROBOTIC ASSISTED LOWER ANTERIOR RESECTION WITH LOOP ILEOSTOMY, Dr. ThMarcello MooresFINAL MICROSCOPIC DIAGNOSIS:   A. COLON, RECTOSIGMOID, RESECTION:  - Invasive adenocarcinoma, moderately differentiated, spanning 2 mm.  - Tumor invades the submucosa.  - Resection margins are negative.  - No carcinoma in thirteen of thirteen lymph nodes (0/13).  - See oncology table.   B. FINAL DISTAL MARGIN:  - Benign colonic mucosa.  - No dysplasia or malignancy.       INTERVAL HISTORY:  Cesar Herrera here for a follow up of rectal cancer. He was last seen by me on 04/17/21. He presents to the clinic alone. He reports doing well overall, with his main complaint being fatigue. He notes the enema procedure on 05/05/21  was not pleasant, but he reports some improvement to his bowel urgency since.   All other systems were reviewed with the patient and are negative.  MEDICAL HISTORY:  Past Medical History:  Diagnosis Date   Cancer [facility])    Family history of bladder cancer    Family history of lymphoma    H/O epididymitis 2011   Seasonal allergies     SURGICAL HISTORY: Past Surgical History:  Procedure Laterality Date   COLONOSCOPY     VASECTOMY     WISDOM TOOTH EXTRACTION     XI ROBOTIC ASSISTED LOWER ANTERIOR RESECTION N/A 03/10/2021   Procedure: XI ROBOTIC ASSISTED LOWER ANTERIOR RESECTION WITH LOOP ILEOSTOMY;  Surgeon: Leighton Ruff, MD;  Location: [facility];  Service: General;   Laterality: N/A;    I have reviewed the social history and family history with the patient and they are unchanged from previous note.  ALLERGIES:  is allergic to erythromycin and penicillins.  MEDICATIONS:  Current Outpatient Medications  Medication Sig Dispense Refill   ALPRAZolam (XANAX) 0.25 MG tablet Take 1 tablet (0.25 mg total) by mouth 2 (two) times daily as needed for anxiety. (Patient not taking: No sig reported) 30 tablet 0   capecitabine (XELODA) 500 MG tablet Take 4 tablets (2,000 mg total) by mouth in morning and 4 tablets (2,000 mg total) by mouth in evening. Take within 30 minutes after meals. Take for 14 days on and 7 days off. 112 tablet 1   loperamide (IMODIUM) 2 MG capsule Take 1 capsule (2 mg total) by mouth 4 (four) times daily - after meals and at bedtime. 120 capsule 1   Multiple Vitamins-Minerals (MULTIVITAMIN) tablet Take 1 tablet by mouth daily. (Patient not taking: No sig reported)     No current facility-administered medications for this visit.   Facility-Administered Medications Ordered in Other Visits  Medication Dose Route Frequency Provider Last Rate Last Admin   gentamicin (GARAMYCIN) 5 mg/kg in dextrose 5 % 50 mL IVPB  5 mg/kg Intravenous 60 min Pre-Op Leighton Ruff, MD        PHYSICAL EXAMINATION: ECOG PERFORMANCE STATUS: 1 - Symptomatic but completely ambulatory  Vitals:   05/09/21 1406  BP: 125/88  Pulse: 83  Resp: 18  Temp: 98.1 F (36.7 C)  SpO2: 98%   Wt Readings from Last 3 Encounters:  05/09/21 168 lb 1.6 oz (76.2 kg)  04/17/21 169 lb 4.8 oz (76.8 kg)  03/27/21 170 lb (77.1 kg)     GENERAL:alert, no distress and comfortable SKIN: skin color normal, no rashes or significant lesions EYES: normal, Conjunctiva are pink and non-injected, sclera clear  NEURO: alert & oriented x 3 with fluent speech  LABORATORY DATA:  I have reviewed the data as listed CBC Latest Ref Rng & Units 05/09/2021 04/17/2021 03/27/2021  WBC 4.0 - 10.5 K/uL 5.2  4.5 4.1  Hemoglobin 13.0 - 17.0 g/dL 14.8 13.8 14.2  Hematocrit 39.0 - 52.0 % 41.6 40.3 41.8  Platelets 150 - 400 K/uL 238 215 317     CMP Latest Ref Rng & Units 05/09/2021 04/17/2021 03/27/2021  Glucose 70 - 99 mg/dL 93 109(H) 93  BUN 6 - 20 mg/dL _0 Creatinine 0.61 - 1.24 mg/dL 1.19 1.05 1.08  Sodium 135 - 145 mmol/L 138 140 139  Potassium 3.5 - 5.1 mmol/L 4.2 4.4 4.6  Chloride 98 - 111 mmol/L 106 105 103  CO2 22 - 32 mmol/L _1 Calcium 8.9 - 10.3 mg/dL 9.5 9.4  9.9  Total Protein 6.5 - 8.1 g/dL 7.5 6.9 6.8  Total Bilirubin 0.3 - 1.2 mg/dL 0.5 0.3 0.4  Alkaline Phos 38 - 126 U/L 92 76 69  AST 15 - 41 U/L _0 ALT 0 - 44 U/L _1 RADIOGRAPHIC STUDIES: I have personally reviewed the radiological images as listed and agreed with the findings in the report. No results found.    No orders of the defined types were placed in this encounter.  All questions were answered. The patient knows to call the clinic with any problems, questions or concerns. No barriers to learning was detected. The total time spent in the appointment was 30 minutes.     [name], MD 05/09/2021   I, Cesar Herrera, am acting as scribe for [name], MD.   I have reviewed the above documentation for accuracy and completeness, and I agree with the above.

## 2021-05-11 ENCOUNTER — Ambulatory Visit: Payer: Self-pay | Admitting: General Surgery

## 2021-05-11 MED ORDER — DEXTROSE 5 % IV SOLN
900.0000 mg | INTRAVENOUS | Status: AC
  Administered 2021-08-03: 900 mg via INTRAVENOUS

## 2021-05-11 MED ORDER — GENTAMICIN SULFATE 40 MG/ML IJ SOLN
5.0000 mg/kg | INTRAVENOUS | Status: AC
  Administered 2021-08-03: 400 mg via INTRAVENOUS

## 2021-05-26 DIAGNOSIS — Z85038 Personal history of other malignant neoplasm of large intestine: Secondary | ICD-10-CM | POA: Diagnosis not present

## 2021-05-26 DIAGNOSIS — Z932 Ileostomy status: Secondary | ICD-10-CM | POA: Diagnosis not present

## 2021-05-29 ENCOUNTER — Ambulatory Visit: Payer: BC Managed Care – PPO | Admitting: Hematology

## 2021-05-29 ENCOUNTER — Other Ambulatory Visit: Payer: BC Managed Care – PPO

## 2021-05-31 ENCOUNTER — Other Ambulatory Visit: Payer: Self-pay

## 2021-05-31 ENCOUNTER — Inpatient Hospital Stay (HOSPITAL_BASED_OUTPATIENT_CLINIC_OR_DEPARTMENT_OTHER): Payer: BC Managed Care – PPO | Admitting: Hematology

## 2021-05-31 ENCOUNTER — Inpatient Hospital Stay: Payer: BC Managed Care – PPO

## 2021-05-31 ENCOUNTER — Encounter: Payer: Self-pay | Admitting: Hematology

## 2021-05-31 VITALS — BP 127/82 | HR 79 | Temp 97.7°F | Resp 18 | Ht 67.0 in | Wt 169.9 lb

## 2021-05-31 DIAGNOSIS — Z79899 Other long term (current) drug therapy: Secondary | ICD-10-CM | POA: Diagnosis not present

## 2021-05-31 DIAGNOSIS — C20 Malignant neoplasm of rectum: Secondary | ICD-10-CM

## 2021-05-31 DIAGNOSIS — R5383 Other fatigue: Secondary | ICD-10-CM | POA: Diagnosis not present

## 2021-05-31 LAB — CMP (CANCER CENTER ONLY)
ALT: 18 U/L (ref 0–44)
AST: 16 U/L (ref 15–41)
Albumin: 3.9 g/dL (ref 3.5–5.0)
Alkaline Phosphatase: 93 U/L (ref 38–126)
Anion gap: 9 (ref 5–15)
BUN: 12 mg/dL (ref 6–20)
CO2: 24 mmol/L (ref 22–32)
Calcium: 9.8 mg/dL (ref 8.9–10.3)
Chloride: 105 mmol/L (ref 98–111)
Creatinine: 1.21 mg/dL (ref 0.61–1.24)
GFR, Estimated: >60 mL/min (ref >60)
Glucose, Bld: 133 mg/dL — ABNORMAL HIGH (ref 70–99)
Potassium: 4.1 mmol/L (ref 3.5–5.1)
Sodium: 138 mmol/L (ref 135–145)
Total Bilirubin: 0.5 mg/dL (ref 0.3–1.2)
Total Protein: 7 g/dL (ref 6.5–8.1)

## 2021-05-31 LAB — CBC WITH DIFFERENTIAL (CANCER CENTER ONLY)
Abs Immature Granulocytes: 0.01 10*3/uL (ref 0.00–0.07)
Basophils Absolute: 0 10*3/uL (ref 0.0–0.1)
Basophils Relative: 1 %
Eosinophils Absolute: 0.1 10*3/uL (ref 0.0–0.5)
Eosinophils Relative: 3 %
HCT: 42.7 % (ref 39.0–52.0)
Hemoglobin: 14.8 g/dL (ref 13.0–17.0)
Immature Granulocytes: 0 %
Lymphocytes Relative: 10 %
Lymphs Abs: 0.5 10*3/uL — ABNORMAL LOW (ref 0.7–4.0)
MCH: 31.2 pg (ref 26.0–34.0)
MCHC: 34.7 g/dL (ref 30.0–36.0)
MCV: 90.1 fL (ref 80.0–100.0)
Monocytes Absolute: 0.4 10*3/uL (ref 0.1–1.0)
Monocytes Relative: 8 %
Neutro Abs: 3.7 10*3/uL (ref 1.7–7.7)
Neutrophils Relative %: 78 %
Platelet Count: 218 10*3/uL (ref 150–400)
RBC: 4.74 MIL/uL (ref 4.22–5.81)
RDW: 16.1 % — ABNORMAL HIGH (ref 11.5–15.5)
WBC Count: 4.8 10*3/uL (ref 4.0–10.5)
nRBC: 0 % (ref 0.0–0.2)

## 2021-05-31 NOTE — Progress Notes (Signed)
[place]   [phone] [phone]   Clinic Follow up Note   Patient Care Team: [name], MD as PCP - General (Internal Medicine) [name], NP as Nurse Practitioner (Nurse Practitioner) [name], MD as Consulting Physician (Oncology)  Date of Service:  05/31/2021  CHIEF COMPLAINT: f/u of rectal cancer  CURRENT THERAPY:  Adjuvant Xeloda, 1500 mg in the morning/2000 mg in the evening, starting 04/03/21; increase to 2000 mg BID 04/24/21  ASSESSMENT & PLAN:  Cesar Herrera is a 50 y.o. male with   1. Adenocarcinoma of the rectum, cT3N0/N1, stage II or III,ypT1N0 -He was diagnosed in 11/2020 by colonoscopy which showed upper/mid rectal adenocarcinoma. -Staging work-up shows no evidence of distant metastatic disease (and 1.4 cm benign hemangioma in right lobe of liver) -Local staging pelvic MRI shows T3N1 disease, however the node is a single 5 mm mesorectal lymph node and could be reactive, could be N0. I spoke with radiology about this -He completed standard neoadjuvant concurrent chemo/RT with Xeloda 12/05/20-01/11/21.  -He proceeded to resection on 03/10/21 under Dr. [name]. Pathology showed 2 mm residual tumor, all negative lymph nodes (0/13). Loop ileostomy performed at that time.  -He began adjuvant Xeloda on 04/03/21, dose reduced for first cycle. He is now up to full dose, 2000 mg q12h for 14 days on and 7 days off. -he tolerates Xeloda well with mild fatigue. -labs reviewed, he is on oral iron. CBC no concern, CMP pending. We will complete repeat iron panel at his next visit. -he has two remaining cycles. I will order posttreatment MRI at his next visit. He will plan to proceed with ileostomy reversal in early 08/2021. -I will see him in 3 weeks before his final cycle.   2. Symptom management: bowel urgency -he reports bowel urgency causing pain -he has tried a suppository with minimal relief. -he met with Dr. [name] on 04/17/21 and  underwent enema on 05/05/21. He reports some relief since then.   3. Genetics -His genetic testing from 12/23/20 was negative for pathogenetic mutations. 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 Iron deficiency -he has started oral iron a few weeks ago -will monitor    PLAN:  -continue Xeloda at 2042m q12h, 14 days on/7 days off. Start C4 06/05/21 -labs and f/u in 3 weeks before cycle 5 (last cycle), will order posttreatment scan at that visit  -include iron panel with lab work on next visit    No problem-specific Assessment & Plan notes found for this encounter.   SUMMARY OF ONCOLOGIC HISTORY: Oncology History Overview Note  Cancer Staging Rectal adenocarcinoma ([facility] Staging form: Colon and Rectum, AJCC 8th Edition - Clinical stage from 11/23/2020: Stage IIIB (cT3, cN1, cM0) - Signed by BAlla Feeling NP on 11/23/2020 Stage prefix: Initial diagnosis    [place]al adenocarcinoma ([place]  11/11/2020 Procedure   Colonoscopy by Dr. JArdis HughsImpression - Three 2 to 5 mm polyps in the descending colon, in the transverse colon and in the cecum, removed with a cold snare. Complete resection. 2/3 polyps retrieved, sent to pathology. - Rectal tumor that appears malignant, distal edge 7cm from the anal verge. This was biopsied and then labeled with Spot submucosal tattoo. - The examination was otherwise normal on direct and retroflexion views.   11/11/2020 Initial Biopsy   Diagnosis 1. Descending Colon Polyp - TUBULAR ADENOMA (3 OF 3 FRAGMENTS) - NO HIGH-GRADE DYSPLASIA OR MALIGNANCY IDENTIFIED 2. Rectum, biopsy - ADENOCARCINOMA, AT LEAST INTRAMUCOSAL  11/11/2020 Tumor Marker   CEA: 1.4 CA 19-9: 10   11/15/2020 Imaging   Pelvic MRI for local staging IMPRESSION: Signs of potential T3 B disease, tortuosity of the rectum at the level of the tumor makes assessment difficult, distorted by the polypoid eccentric tumor in the high rectum as described.   N1 disease.   No extramural venous  invasion. Tumor extends just to the level of the APR in this center below this level.   11/17/2020 Imaging   CT CAP IMPRESSION: 1. Known rectal mass with a 5 mm in short axis left mesorectal lymph node better shown on the prior exam and only faintly apparent on the CT. 2. 1.4 by 1.2 by 0.9 cm hypodense lesion in the right hepatic lobe on image 50 of series 2, poorly seen on the delayed images. This lesion is indeterminate by CT and could be benign or malignant. Imaging possibilities for with further workup might include hepatic protocol MRI with and without contrast or PET-CT. 3. Lucent lesion of the left posterior L2 vertebral body potentially extending slightly into the pedicle, measuring 1.9 by 1.4 cm on image 116 of series 5. This previously measured 1.6 by 1.3 cm on 03/28/2010, accordingly making it unlikely to be a metastatic lesion. 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 4. Small right middle lobe and right lower lobe pulmonary nodules are likely benign but merit surveillance in this context.   11/18/2020 Initial Diagnosis   Rectal adenocarcinoma ([place])   11/23/2020 Cancer Staging   Staging form: Colon and Rectum, AJCC 8th Edition - Clinical stage from 11/23/2020: Stage IIIB (cT3, cN1, cM0) - Signed by [name], NP on 11/23/2020 Stage prefix: Initial diagnosis   12/02/2020 Imaging   MRI Liver  IMPRESSION: Tiny benign hemangioma in the right hepatic lobe, which corresponds to the lesion seen on recent CT. No evidence of metastatic disease within the liver or abdomen.   12/05/2020 - 01/11/2021 Radiation Therapy   Concurrent chemoradiation with Dr Lisbeth Renshaw    12/05/2020 - 01/11/2021 Chemotherapy   Concurrent chemoradiation with Xeloda 2033m in the AM and 15044min the PM M-F on days of Radiation   12/23/2020 Genetic Testing   Negative genetic testing:  No pathogenic variants detected on the [employer] CancerNext-Expanded +  RNAinsight panel. 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 The report date is 12/23/2020.  The CancerNext-Expanded + RNAinsight gene panel offered by [employer] includes sequencing and rearrangement analysis for the following 77 genes: AIP, ALK, APC, ATM, AXIN2, BAP1, BARD1, BLM, BMPR1A, BRCA1, BRCA2, BRIP1, CDC73, CDH1, CDK4, CDKN1B, CDKN2A, CHEK2, CTNNA1, DICER1, FANCC, FH, FLCN, GALNT12, KIF1B, LZTR1, MAX, MEN1, MET, MLH1, MSH2, MSH3, MSH6, MUTYH, NBN, NF1, NF2, NTHL1, PALB2, PHOX2B, PMS2, POT1, PRKAR1A, PTCH1, PTEN, RAD51C, RAD51D, RB1, RECQL, RET, SDHA, SDHAF2, SDHB, SDHC, SDHD, SMAD4, SMARCA4, SMARCB1, SMARCE1, STK11, SUFU, TMEM127, TP53, TSC1, TSC2, VHL and XRCC2 (sequencing and deletion/duplication); EGFR, EGLN1, HOXB13, KIT, MITF, PDGFRA, POLD1 and POLE (sequencing only); EPCAM and GREM1 (deletion/duplication only). RNA data is routinely analyzed for use in variant interpretation for all genes.    03/10/2021 Surgery   XI ROBOTIC ASSISTED LOWER ANTERIOR RESECTION WITH LOOP ILEOSTOMY, Dr. ThMarcello MooresFINAL MICROSCOPIC DIAGNOSIS:   A. COLON, RECTOSIGMOID, RESECTION:  - Invasive adenocarcinoma, moderately differentiated, spanning 2 mm.  - Tumor invades the submucosa.  - Resection margins are negative.  - No carcinoma in thirteen  of thirteen lymph nodes (0/13).  - See oncology table.   B. FINAL DISTAL MARGIN:  - Benign colonic mucosa.  - No dysplasia or malignancy.    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 He  was last seen by me on 05/09/21. He presents to the clinic alone. He denies any new problems from Xeloda. He notes continued fatigue while on the Xeloda. He notes Dr. [name] plans to perform surgery in the beginning of December, approximately 4 weeks after he finished Xeloda.   All other systems were reviewed with the patient and are negative.  MEDICAL HISTORY:  Past Medical History:  Diagnosis Date   Cancer [facility])    Family history of bladder cancer    Family history of lymphoma    H/O epididymitis 2011   Seasonal allergies     SURGICAL HISTORY: Past Surgical History:  Procedure Laterality Date   COLONOSCOPY     VASECTOMY     WISDOM TOOTH EXTRACTION     XI ROBOTIC ASSISTED LOWER ANTERIOR RESECTION N/A 03/10/2021   Procedure: XI ROBOTIC ASSISTED LOWER ANTERIOR RESECTION WITH LOOP ILEOSTOMY;  Surgeon: Leighton Ruff, MD;  Location: [facility];  Service: General;  Laterality: N/A;    I have reviewed the social history and family history with the patient and they are unchanged from previous note.  ALLERGIES:  is allergic to erythromycin and penicillins.  MEDICATIONS:  Current Outpatient Medications  Medication Sig Dispense Refill   ALPRAZolam (XANAX) 0.25 MG tablet Take 1 tablet (0.25 mg total) by mouth 2 (two) times daily as needed for anxiety. (Patient not taking: No sig reported) 30 tablet 0   capecitabine (XELODA) 500 MG tablet Take 4 tablets (2,000 mg total) by mouth in morning and 4 tablets (2,000 mg total) by mouth in evening. Take within 30 minutes after meals. Take for 14 days on and 7 days off. 112 tablet 1   loperamide (IMODIUM) 2 MG capsule Take 1 capsule (2 mg total) by mouth 4 (four) times daily - after meals and at bedtime. 120 capsule 1   Multiple Vitamins-Minerals (MULTIVITAMIN) tablet Take 1 tablet by mouth daily. (Patient not taking: No sig reported)     No current facility-administered medications for this visit.   Facility-Administered Medications Ordered in Other  Visits  Medication Dose Route Frequency Provider Last Rate Last Admin   clindamycin (CLEOCIN) 900 mg in dextrose 5 % 50 mL IVPB  900 mg Intravenous 60 min Pre-Op Leighton Ruff, MD       And   gentamicin (GARAMYCIN) 380 mg in dextrose 5 % 50 mL IVPB  5 mg/kg Intravenous 60 min Pre-Op Leighton Ruff, MD       gentamicin (GARAMYCIN) 5 mg/kg in dextrose 5 % 50 mL IVPB  5 mg/kg Intravenous 60 min Pre-Op Leighton Ruff, MD        PHYSICAL EXAMINATION: ECOG PERFORMANCE STATUS: 1 - Symptomatic but completely ambulatory  Vitals:   05/31/21 1405  BP: 127/82  Pulse: 79  Resp: 18  Temp: 97.7 F (36.5 C)  SpO2: 100%   Wt Readings  from Last 3 Encounters:  05/31/21 169 lb 14.4 oz (77.1 kg)  05/09/21 168 lb 1.6 oz (76.2 kg)  04/17/21 169 lb 4.8 oz (76.8 kg)     GENERAL:alert, no distress and comfortable SKIN: skin color normal, no rashes or significant lesions EYES: normal, Conjunctiva are pink and non-injected, sclera clear  NEURO: alert & oriented x 3 with fluent speech  LABORATORY DATA:  I have reviewed the data as listed CBC Latest Ref Rng & Units 05/31/2021 05/09/2021 04/17/2021  WBC 4.0 - 10.5 K/uL 4.8 5.2 4.5  Hemoglobin 13.0 - 17.0 g/dL 14.8 14.8 13.8  Hematocrit 39.0 - 52.0 % 42.7 41.6 40.3  Platelets 150 - 400 K/uL 218 238 215     CMP Latest Ref Rng & Units 05/31/2021 05/09/2021 04/17/2021  Glucose 70 - 99 mg/dL 133(H) 93 109(H)  BUN 6 - 20 mg/dL 12 11 11   Creatinine 0.61 - 1.24 mg/dL 1.21 1.19 1.05  Sodium 135 - 145 mmol/L 138 138 140  Potassium 3.5 - 5.1 mmol/L 4.1 4.2 4.4  Chloride 98 - 111 mmol/L 105 106 105  CO2 22 - 32 mmol/L 24 23 23   Calcium 8.9 - 10.3 mg/dL 9.8 9.5 9.4  Total Protein 6.5 - 8.1 g/dL 7.0 7.5 6.9  Total Bilirubin 0.3 - 1.2 mg/dL 0.5 0.5 0.3  Alkaline Phos 38 - 126 U/L 93 92 76  AST 15 - 41 U/L 16 15 15   ALT 0 - 44 U/L 18 17 17       RADIOGRAPHIC STUDIES: I have personally reviewed the radiological images as listed and agreed with the findings in  the report. No results found.    No orders of the defined types were placed in this encounter.  All questions were answered. The patient knows to call the clinic with any problems, questions or concerns. No barriers to learning was detected.      [name], MD 05/31/2021   I, Wilburn Mylar, am acting as scribe for [name], MD.   I have reviewed the above documentation for accuracy and completeness, and I agree with the above.

## 2021-06-01 ENCOUNTER — Other Ambulatory Visit (HOSPITAL_COMMUNITY): Payer: Self-pay

## 2021-06-09 DIAGNOSIS — Z85038 Personal history of other malignant neoplasm of large intestine: Secondary | ICD-10-CM | POA: Diagnosis not present

## 2021-06-09 DIAGNOSIS — Z932 Ileostomy status: Secondary | ICD-10-CM | POA: Diagnosis not present

## 2021-06-12 DIAGNOSIS — Z932 Ileostomy status: Secondary | ICD-10-CM | POA: Diagnosis not present

## 2021-06-12 DIAGNOSIS — Z85038 Personal history of other malignant neoplasm of large intestine: Secondary | ICD-10-CM | POA: Diagnosis not present

## 2021-06-15 ENCOUNTER — Other Ambulatory Visit (HOSPITAL_COMMUNITY): Payer: Self-pay

## 2021-06-22 ENCOUNTER — Other Ambulatory Visit (HOSPITAL_COMMUNITY): Payer: Self-pay

## 2021-06-23 ENCOUNTER — Other Ambulatory Visit (HOSPITAL_COMMUNITY): Payer: Self-pay

## 2021-06-23 ENCOUNTER — Other Ambulatory Visit: Payer: Self-pay

## 2021-06-23 ENCOUNTER — Inpatient Hospital Stay (HOSPITAL_BASED_OUTPATIENT_CLINIC_OR_DEPARTMENT_OTHER): Payer: BC Managed Care – PPO | Admitting: Hematology

## 2021-06-23 ENCOUNTER — Inpatient Hospital Stay: Payer: BC Managed Care – PPO | Attending: Nurse Practitioner

## 2021-06-23 VITALS — BP 130/76 | HR 90 | Temp 98.7°F | Resp 18 | Wt 174.6 lb

## 2021-06-23 DIAGNOSIS — Z79899 Other long term (current) drug therapy: Secondary | ICD-10-CM | POA: Diagnosis not present

## 2021-06-23 DIAGNOSIS — E611 Iron deficiency: Secondary | ICD-10-CM | POA: Insufficient documentation

## 2021-06-23 DIAGNOSIS — C20 Malignant neoplasm of rectum: Secondary | ICD-10-CM | POA: Diagnosis not present

## 2021-06-23 LAB — CBC WITH DIFFERENTIAL (CANCER CENTER ONLY)
Abs Immature Granulocytes: 0.02 10*3/uL (ref 0.00–0.07)
Basophils Absolute: 0 10*3/uL (ref 0.0–0.1)
Basophils Relative: 1 %
Eosinophils Absolute: 0 10*3/uL (ref 0.0–0.5)
Eosinophils Relative: 1 %
HCT: 42.4 % (ref 39.0–52.0)
Hemoglobin: 14.7 g/dL (ref 13.0–17.0)
Immature Granulocytes: 0 %
Lymphocytes Relative: 6 %
Lymphs Abs: 0.4 10*3/uL — ABNORMAL LOW (ref 0.7–4.0)
MCH: 31.8 pg (ref 26.0–34.0)
MCHC: 34.7 g/dL (ref 30.0–36.0)
MCV: 91.8 fL (ref 80.0–100.0)
Monocytes Absolute: 0.3 10*3/uL (ref 0.1–1.0)
Monocytes Relative: 5 %
Neutro Abs: 5.4 10*3/uL (ref 1.7–7.7)
Neutrophils Relative %: 87 %
Platelet Count: 219 10*3/uL (ref 150–400)
RBC: 4.62 MIL/uL (ref 4.22–5.81)
RDW: 15.8 % — ABNORMAL HIGH (ref 11.5–15.5)
WBC Count: 6.2 10*3/uL (ref 4.0–10.5)
nRBC: 0 % (ref 0.0–0.2)

## 2021-06-23 LAB — CMP (CANCER CENTER ONLY)
ALT: 25 U/L (ref 0–44)
AST: 21 U/L (ref 15–41)
Albumin: 4 g/dL (ref 3.5–5.0)
Alkaline Phosphatase: 83 U/L (ref 38–126)
Anion gap: 8 (ref 5–15)
BUN: 15 mg/dL (ref 6–20)
CO2: 25 mmol/L (ref 22–32)
Calcium: 9.1 mg/dL (ref 8.9–10.3)
Chloride: 106 mmol/L (ref 98–111)
Creatinine: 1.04 mg/dL (ref 0.61–1.24)
GFR, Estimated: >60 mL/min (ref >60)
Glucose, Bld: 136 mg/dL — ABNORMAL HIGH (ref 70–99)
Potassium: 4.2 mmol/L (ref 3.5–5.1)
Sodium: 139 mmol/L (ref 135–145)
Total Bilirubin: 0.4 mg/dL (ref 0.3–1.2)
Total Protein: 7.4 g/dL (ref 6.5–8.1)

## 2021-06-23 LAB — FERRITIN: Ferritin: 81 ng/mL (ref 24–336)

## 2021-06-23 LAB — IRON AND TIBC
Iron: 67 ug/dL (ref 45–182)
Saturation Ratios: 14 % — ABNORMAL LOW (ref 17.9–39.5)
TIBC: 496 ug/dL — ABNORMAL HIGH (ref 250–450)
UIBC: 429 ug/dL

## 2021-06-23 MED ORDER — CAPECITABINE 500 MG PO TABS
ORAL_TABLET | ORAL | 0 refills | Status: DC
  Filled 2021-06-23: qty 112, 21d supply, fill #0

## 2021-06-23 NOTE — Progress Notes (Signed)
[place]   [phone] [phone]   Clinic Follow up Note   Patient Care Team: [name], MD as PCP - General (Internal Medicine) [name], NP as Nurse Practitioner (Nurse Practitioner) [name], MD as Consulting Physician (Oncology)  Date of Service:  06/23/2021  CHIEF COMPLAINT: f/u of rectal cancer  CURRENT THERAPY:  Adjuvant Xeloda, 1500 mg in the morning/2000 mg in the evening, starting 04/03/21; increase to 2000 mg BID 04/24/21  ASSESSMENT & PLAN:  Cesar Herrera is a 50 y.o. male with   1. Adenocarcinoma of the rectum, cT3N0/N1, stage II or III,ypT1N0 -He was diagnosed in 11/2020 by colonoscopy which showed upper/mid rectal adenocarcinoma. -Staging work-up shows no evidence of distant metastatic disease (and 1.4 cm benign hemangioma in right lobe of liver) -Local staging pelvic MRI shows T3N1 disease, however the node is a single 5 mm mesorectal lymph node and could be reactive, could be N0. I spoke with radiology about this -He completed standard neoadjuvant concurrent chemo/RT with Xeloda 12/05/20-01/11/21.  -He proceeded to resection on 03/10/21 under Dr. [name]. Pathology showed 2 mm residual tumor, all negative lymph nodes (0/13). Loop ileostomy performed at that time.  -He began adjuvant Xeloda on 04/03/21, dose reduced for first cycle. He is now up to full dose, 2000 mg q12h for 14 days on and 7 days off. He tolerates Xeloda well with mild fatigue. -labs reviewed-- CBC no concern, CMP and iron panel pending.  -he will start his last cycle of Xeloda on Monday (10/24). We will plan for posttreatment restaging scan before Thanksgiving. -he is scheduled for ileostomy reversal on 08/03/21.   2. Symptom management: bowel urgency -he reports bowel urgency causing pain -he has tried a suppository with minimal relief. -he met with Dr. [name] on 04/17/21 and underwent enema on 05/05/21. He reports some relief since then.   3. Genetics -His  genetic testing from 12/23/20 was negative for pathogenetic mutations. 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 Iron deficiency -he has started oral iron in 05/2021 -will monitor     PLAN:  -he will start last cycle Xeloda at 2075m q12h, 14 days on/7 days off on 10/24. I refilled for his last cycle today. -plan for restaging scan with lab the week before Thanksgiving -f/u in 4 weeks (after CT)   No problem-specific Assessment & Plan notes found for this encounter.   SUMMARY OF ONCOLOGIC HISTORY: Oncology History Overview Note  Cancer Staging Rectal adenocarcinoma ([facility] Staging form: Colon and Rectum, AJCC 8th Edition - Clinical stage from 11/23/2020: Stage IIIB (cT3, cN1, cM0) - Signed by BAlla Feeling NP on 11/23/2020 Stage prefix: Initial diagnosis    Rectal adenocarcinoma ([place]  11/11/2020 Procedure   Colonoscopy by Dr. JArdis HughsImpression - Three 2 to 5 mm polyps in the descending colon, in the transverse colon and in the cecum, removed with a cold snare. Complete resection. 2/3 polyps retrieved, sent to pathology. - Rectal tumor that appears malignant, distal edge 7cm from the anal verge. This was biopsied and then labeled with Spot submucosal tattoo. - The examination was otherwise normal on direct and retroflexion views.   11/11/2020 Initial Biopsy   Diagnosis 1. Descending Colon Polyp - TUBULAR ADENOMA (3 OF 3 FRAGMENTS) - NO HIGH-GRADE DYSPLASIA OR MALIGNANCY IDENTIFIED 2. Rectum, biopsy - ADENOCARCINOMA, AT LEAST INTRAMUCOSAL   11/11/2020 Tumor Marker   CEA: 1.4 CA 19-9: 10   11/15/2020 Imaging   Pelvic MRI for local staging  IMPRESSION: Signs of potential T3 B disease, tortuosity of the rectum at the level of the tumor makes assessment difficult, distorted by the polypoid eccentric tumor in the high rectum as described.   N1 disease.   No extramural venous invasion. Tumor extends just to the level of the APR in this center below this level.   11/17/2020  Imaging   CT CAP IMPRESSION: 1. Known rectal mass with a 5 mm in short axis left mesorectal lymph node better shown on the prior exam and only faintly apparent on the CT. 2. 1.4 by 1.2 by 0.9 cm hypodense lesion in the right hepatic lobe on image 50 of series 2, poorly seen on the delayed images. This lesion is indeterminate by CT and could be benign or malignant. Imaging possibilities for with further workup might include hepatic protocol MRI with and without contrast or PET-CT. 3. Lucent lesion of the left posterior L2 vertebral body potentially extending slightly into the pedicle, measuring 1.9 by 1.4 cm on image 116 of series 5. This previously measured 1.6 by 1.3 cm on 03/28/2010, accordingly making it unlikely to be a metastatic lesion. 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 4. Small right middle lobe and right lower lobe pulmonary nodules are likely benign but merit surveillance in this context.   11/18/2020 Initial Diagnosis   Rectal adenocarcinoma ([place])   11/23/2020 Cancer Staging   Staging form: Colon and Rectum, AJCC 8th Edition - Clinical stage from 11/23/2020: Stage IIIB (cT3, cN1, cM0) - Signed by [name], NP on 11/23/2020 Stage prefix: Initial diagnosis    12/02/2020 Imaging   MRI Liver  IMPRESSION: Tiny benign hemangioma in the right hepatic lobe, which corresponds to the lesion seen on recent CT. No evidence of metastatic disease within the liver or abdomen.   12/05/2020 - 01/11/2021 Radiation Therapy   Concurrent chemoradiation with Dr Lisbeth Renshaw    12/05/2020 - 01/11/2021 Chemotherapy   Concurrent chemoradiation with Xeloda 2057m in the AM and 15079min the PM M-F on days of Radiation   12/23/2020 Genetic Testing   Negative genetic testing:  No pathogenic variants detected on the [employer] CancerNext-Expanded + RNAinsight panel. 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 The report date is 12/23/2020.  The CancerNext-Expanded + RNAinsight gene panel offered by [employer] includes sequencing and rearrangement analysis for the following 77 genes: AIP, ALK, APC, ATM, AXIN2, BAP1, BARD1, BLM, BMPR1A, BRCA1, BRCA2, BRIP1, CDC73, CDH1, CDK4, CDKN1B, CDKN2A, CHEK2, CTNNA1, DICER1, FANCC, FH, FLCN, GALNT12, KIF1B, LZTR1, MAX, MEN1, MET, MLH1, MSH2, MSH3, MSH6, MUTYH, NBN, NF1, NF2, NTHL1, PALB2, PHOX2B, PMS2, POT1, PRKAR1A, PTCH1, PTEN, RAD51C, RAD51D, RB1, RECQL, RET, SDHA, SDHAF2, SDHB, SDHC, SDHD, SMAD4, SMARCA4, SMARCB1, SMARCE1, STK11, SUFU, TMEM127, TP53, TSC1, TSC2, VHL and XRCC2 (sequencing and deletion/duplication); EGFR, EGLN1, HOXB13, KIT, MITF, PDGFRA, POLD1 and POLE (sequencing only); EPCAM and GREM1 (deletion/duplication only). RNA data is routinely analyzed for use in variant interpretation for all genes.    03/10/2021 Surgery   XI ROBOTIC ASSISTED LOWER ANTERIOR RESECTION WITH LOOP ILEOSTOMY, Dr. ThMarcello MooresFINAL MICROSCOPIC DIAGNOSIS:   A. COLON, RECTOSIGMOID, RESECTION:  - Invasive adenocarcinoma, moderately differentiated, spanning 2 mm.  - Tumor invades the submucosa.  - Resection margins are negative.  - No carcinoma in thirteen of thirteen lymph nodes (0/13).  - See oncology table.   B. FINAL DISTAL MARGIN:  - Benign colonic  mucosa.  - No dysplasia or malignancy.    03/10/2021 Cancer Staging   Staging form: Colon and Rectum, AJCC 8th Edition - Pathologic stage from 03/10/2021: Stage I (ypT1, pN0, cM0) - Signed by [name], MD on 05/31/2021 Stage prefix: Post-therapy Response to neoadjuvant therapy: Partial response Total positive nodes: 0 Histologic grading system: 4 grade system Histologic grade (G): G2 Residual tumor (R): R0 - None       INTERVAL HISTORY:  Cesar Herrera is here for a follow up of rectal cancer. He was last seen by me on 05/31/21. He presents to the clinic alone. He reports he was "dragging" last  week. He notes feeling better off the medicine this week.   All other systems were reviewed with the patient and are negative.  MEDICAL HISTORY:  Past Medical History:  Diagnosis Date   Cancer [facility])    Family history of bladder cancer    Family history of lymphoma    H/O epididymitis 2011   Seasonal allergies     SURGICAL HISTORY: Past Surgical History:  Procedure Laterality Date   COLONOSCOPY     VASECTOMY     WISDOM TOOTH EXTRACTION     XI ROBOTIC ASSISTED LOWER ANTERIOR RESECTION N/A 03/10/2021   Procedure: XI ROBOTIC ASSISTED LOWER ANTERIOR RESECTION WITH LOOP ILEOSTOMY;  Surgeon: Leighton Ruff, MD;  Location: [facility]S;  Service: General;  Laterality: N/A;    I have reviewed the social history and family history with the patient and they are unchanged from previous note.  ALLERGIES:  is allergic to erythromycin and penicillins.  MEDICATIONS:  Current Outpatient Medications  Medication Sig Dispense Refill   ALPRAZolam (XANAX) 0.25 MG tablet Take 1 tablet (0.25 mg total) by mouth 2 (two) times daily as needed for anxiety. (Patient not taking: No sig reported) 30 tablet 0   capecitabine (XELODA) 500 MG tablet Take 4 tablets (2,000 mg total) by mouth in morning and 4 tablets (2,000 mg total) by mouth in evening. Take within 30 minutes after meals. Take for 14 days on and 7 days off. 112 tablet 1   loperamide (IMODIUM) 2 MG capsule Take 1 capsule (2 mg total) by mouth 4 (four) times daily - after meals and at bedtime. 120 capsule 1   Multiple Vitamins-Minerals (MULTIVITAMIN) tablet Take 1 tablet by mouth daily. (Patient not taking: No sig reported)     No current facility-administered medications for this visit.   Facility-Administered Medications Ordered in Other Visits  Medication Dose Route Frequency Provider Last Rate Last Admin   clindamycin (CLEOCIN) 900 mg in dextrose 5 % 50 mL IVPB  900 mg Intravenous 60 min Pre-Op Leighton Ruff, MD       And   gentamicin (GARAMYCIN)  380 mg in dextrose 5 % 50 mL IVPB  5 mg/kg Intravenous 60 min Pre-Op Leighton Ruff, MD       gentamicin (GARAMYCIN) 5 mg/kg in dextrose 5 % 50 mL IVPB  5 mg/kg Intravenous 60 min Pre-Op Leighton Ruff, MD        PHYSICAL EXAMINATION: ECOG PERFORMANCE STATUS: 1 - Symptomatic but completely ambulatory  There were no vitals filed for this visit. Wt Readings from Last 3 Encounters:  05/31/21 169 lb 14.4 oz (77.1 kg)  05/09/21 168 lb 1.6 oz (76.2 kg)  04/17/21 169 lb 4.8 oz (76.8 kg)     GENERAL:alert, no distress and comfortable SKIN: skin color normal, no rashes or significant lesions EYES: normal, Conjunctiva are pink and non-injected, sclera clear  NEURO: alert & oriented x 3 with fluent speech  LABORATORY DATA:  I have reviewed the data as listed CBC Latest Ref Rng & Units 06/23/2021 05/31/2021 05/09/2021  WBC 4.0 - 10.5 K/uL 6.2 4.8 5.2  Hemoglobin 13.0 - 17.0 g/dL 14.7 14.8 14.8  Hematocrit 39.0 - 52.0 % 42.4 42.7 41.6  Platelets 150 - 400 K/uL 219 218 238     CMP Latest Ref Rng & Units 05/31/2021 05/09/2021 04/17/2021  Glucose 70 - 99 mg/dL 133(H) 93 109(H)  BUN 6 - 20 mg/dL 12 11 11   Creatinine 0.61 - 1.24 mg/dL 1.21 1.19 1.05  Sodium 135 - 145 mmol/L 138 138 140  Potassium 3.5 - 5.1 mmol/L 4.1 4.2 4.4  Chloride 98 - 111 mmol/L 105 106 105  CO2 22 - 32 mmol/L 24 23 23   Calcium 8.9 - 10.3 mg/dL 9.8 9.5 9.4  Total Protein 6.5 - 8.1 g/dL 7.0 7.5 6.9  Total Bilirubin 0.3 - 1.2 mg/dL 0.5 0.5 0.3  Alkaline Phos 38 - 126 U/L 93 92 76  AST 15 - 41 U/L 16 15 15   ALT 0 - 44 U/L 18 17 17       RADIOGRAPHIC STUDIES: I have personally reviewed the radiological images as listed and agreed with the findings in the report. No results found.    Orders Placed This Encounter  Procedures   CT CHEST ABDOMEN PELVIS W CONTRAST    Standing Status:   Future    Standing Expiration Date:   06/23/2022    Order Specific Question:   Preferred imaging location?    Answer:   [facility]    Order Specific Question:   Release to patient    Answer:   Immediate    Order Specific Question:   Is Oral Contrast requested for this exam?    Answer:   Yes, Per Radiology protocol   All questions were answered. The patient knows to call the clinic with any problems, questions or concerns. No barriers to learning was detected.      [name], MD 06/23/2021   I, Wilburn Mylar, am acting as scribe for [name], MD.   I have reviewed the above documentation for accuracy and completeness, and I agree with the above.

## 2021-06-25 ENCOUNTER — Encounter: Payer: Self-pay | Admitting: Hematology

## 2021-06-29 ENCOUNTER — Other Ambulatory Visit (HOSPITAL_COMMUNITY): Payer: Self-pay

## 2021-07-06 ENCOUNTER — Other Ambulatory Visit (HOSPITAL_COMMUNITY): Payer: Self-pay

## 2021-07-11 ENCOUNTER — Encounter (HOSPITAL_COMMUNITY): Payer: Self-pay

## 2021-07-11 ENCOUNTER — Other Ambulatory Visit (HOSPITAL_COMMUNITY): Payer: Self-pay

## 2021-07-11 NOTE — Patient Instructions (Addendum)
DUE TO COVID-19 ONLY ONE VISITOR IS ALLOWED TO COME WITH YOU AND STAY IN THE WAITING ROOM ONLY DURING PRE OP AND PROCEDURE DAY OF SURGERY.   Up to two visitors ages 16+ are allowed at one time in a patient's room.  The visitors may rotate out with other people throughout the day.  Additionally, up to two children between the ages of 38 and 52 are allowed and do not count toward the number of allowed visitors.  Children within this age range must be accompanied by an adult visitor.  One adult visitor may remain with the patient overnight and must be in the room by 8 PM.  YOU NEED TO HAVE A COVID 19 TEST ON: 08/01/21  between 8am-3pm______, THIS TEST MUST BE DONE BEFORE SURGERY,     Please bring completed form with you to the COVID testing site   COVID TESTING SITE [place] TEST IS COMPLETED,  PLEASE Wear a mask when in public           Your procedure is scheduled on: 08-03-21   Report to [facility] Main  Entrance   Report to admitting at       [place]  AM    Call this number if you have problems the morning of surgery [phone]   Remember: Follow a clear liquid diet the day of your bowel prep to prevent dehydration  DRINK 2 PRESURGERY ENSURE DRINKS THE NIGHT BEFORE SURGERY AT  1000 PM AND 1 PRESURGERY DRINK THE DAY OF THE PROCEDURE 3 HOURS PRIOR TO SCHEDULED SURGERY. NO SOLIDS AFTER MIDNIGHT THE DAY PRIOR TO THE SURGERY. NOTHING BY MOUTH EXCEPT CLEAR LIQUIDS UNTIL THREE HOURS PRIOR TO SCHEDULED SURGERY. PLEASE FINISH PRESURGERY ENSURE DRINK PER SURGEON ORDER 3 HOURS PRIOR TO SCHEDULED SURGERY TIME WHICH NEEDS TO BE COMPLETED AT __0430 am then nothing by mouth_______.   CLEAR LIQUID DIET  Foods Allowed                                                                                                     Foods Excluded  Black Coffee and tea, regular and decaf  no creamer                                   liquids that you  cannot  Plain Jell-O any favor except red or purple                                           see through such as: Fruit ices (not with fruit pulp)  milk, soups, orange juice  Iced Popsicles                                                                          All solid food                                 Cranberry, grape and apple juices Sports drinks like Gatorade Lightly seasoned clear broth or consume(fat free) Sugar  Sample Menu Breakfast                                Lunch                                     Supper Cranberry juice                    Beef broth                            Chicken broth Jell-O                                     Grape juice                           Apple juice Coffee or tea                        Jell-O                                      Popsicle                                                Coffee or tea                        Coffee or tea   BRUSH YOUR TEETH MORNING OF SURGERY AND RINSE YOUR MOUTH OUT, NO CHEWING GUM CANDY OR MINTS.    Take these medicines the morning of surgery with A SIP OF WATER: xeloda  DO NOT TAKE ANY DIABETIC MEDICATIONS DAY OF YOUR SURGERY                               You may not have any metal on your body including hair pins and              piercings  Do not wear jewelry, make-up, lotions, powders,perfumes,     or   deodorant             Do not wear nail  polish on your fingernails or toenails .  Do not shave  48 hours prior to surgery.              Men may shave face and neck.   Do not bring valuables to the hospital. [place].  Contacts, dentures or bridgework may not be worn into surgery.  You may bring a small overnight bag with you    Patients discharged the day of surgery will not be allowed to drive home. IF YOU ARE HAVING SURGERY AND GOING HOME THE SAME DAY, YOU MUST HAVE AN  ADULT TO DRIVE YOU HOME AND BE WITH YOU FOR 24 HOURS. YOU MAY GO HOME BY TAXI OR UBER OR ORTHERWISE, BUT AN ADULT MUST ACCOMPANY YOU HOME AND STAY WITH YOU FOR 24 HOURS.  Name and phone number of your driver:  Special Instructions: N/A              Please read over the following fact sheets you were given: _____________________________________________________________________          [facility] - Preparing for Surgery Before surgery, you can play an important role.  Because skin is not sterile, your skin needs to be as free of germs as possible.  You can reduce the number of germs on your skin by washing with CHG (chlorahexidine gluconate) soap before surgery.  CHG is an antiseptic cleaner which kills germs and bonds with the skin to continue killing germs even after washing. Please DO NOT use if you have an allergy to CHG or antibacterial soaps.  If your skin becomes reddened/irritated stop using the CHG and inform your nurse when you arrive at Short Stay. Do not shave (including legs and underarms) for at least 48 hours prior to the first CHG shower.  You may shave your face/neck. Please follow these instructions carefully:  1.  Shower with CHG Soap the night before surgery and the  morning of Surgery.  2.  If you choose to wash your hair, wash your hair first as usual with your  normal  shampoo.  3.  After you shampoo, rinse your hair and body thoroughly to remove the  shampoo.                           4.  Use CHG as you would any other liquid soap.  You can apply chg directly  to the skin and wash                       Gently with a scrungie or clean washcloth.  5.  Apply the CHG Soap to your body ONLY FROM THE NECK DOWN.   Do not use on face/ open                           Wound or open sores. Avoid contact with eyes, ears mouth and genitals (private parts).                       Wash face,  Genitals (private parts) with your normal soap.             6.  Wash thoroughly, paying special  attention to the area where your surgery  will be performed.  7.  Thoroughly rinse your  body with warm water from the neck down.  8.  DO NOT shower/wash with your normal soap after using and rinsing off  the CHG Soap.                9.  Pat yourself dry with a clean towel.            10.  Wear clean pajamas.            11.  Place clean sheets on your bed the night of your first shower and do not  sleep with pets. Day of Surgery : Do not apply any lotions/deodorants the morning of surgery.  Please wear clean clothes to the hospital/surgery center.  FAILURE TO FOLLOW THESE INSTRUCTIONS MAY RESULT IN THE CANCELLATION OF YOUR SURGERY PATIENT SIGNATURE_________________________________  NURSE SIGNATURE__________________________________  ________________________________________________________________________    Adam Phenix  An incentive spirometer is a tool that can help keep your lungs clear and active. This tool measures how well you are filling your lungs with each breath. Taking long deep breaths may help reverse or decrease the chance of developing breathing (pulmonary) problems (especially infection) following: A long period of time when you are unable to move or be active. BEFORE THE PROCEDURE  If the spirometer includes an indicator to show your best effort, your nurse or respiratory therapist will set it to a desired goal. If possible, sit up straight or lean slightly forward. Try not to slouch. Hold the incentive spirometer in an upright position. INSTRUCTIONS FOR USE  Sit on the edge of your bed if possible, or sit up as far as you can in bed or on a chair. Hold the incentive spirometer in an upright position. Breathe out normally. Place the mouthpiece in your mouth and seal your lips tightly around it. Breathe in slowly and as deeply as possible, raising the piston or the ball toward the top of the column. Hold your breath for 3-5 seconds or for as long as possible. Allow  the piston or ball to fall to the bottom of the column. Remove the mouthpiece from your mouth and breathe out normally. Rest for a few seconds and repeat Steps 1 through 7 at least 10 times every 1-2 hours when you are awake. Take your time and take a few normal breaths between deep breaths. The spirometer may include an indicator to show your best effort. Use the indicator as a goal to work toward during each repetition. After each set of 10 deep breaths, practice coughing to be sure your lungs are clear. If you have an incision (the cut made at the time of surgery), support your incision when coughing by placing a pillow or rolled up towels firmly against it. Once you are able to get out of bed, walk around indoors and cough well. You may stop using the incentive spirometer when instructed by your caregiver.  RISKS AND COMPLICATIONS Take your time so you do not get dizzy or light-headed. If you are in pain, you may need to take or ask for pain medication before doing incentive spirometry. It is harder to take a deep breath if you are having pain. AFTER USE Rest and breathe slowly and easily. It can be helpful to keep track of a log of your progress. Your caregiver can provide you with a simple table to help with this. If you are using the spirometer at home, follow these instructions: [place] IF:  You are having difficultly using the spirometer. You have trouble  using the spirometer as often as instructed. Your pain medication is not giving enough relief while using the spirometer. You develop fever of 100.5 F (38.1 C) or higher. SEEK IMMEDIATE MEDICAL CARE IF:  You cough up bloody sputum that had not been present before. You develop fever of 102 F (38.9 C) or greater. You develop worsening pain at or near the incision site. MAKE SURE YOU:  Understand these instructions. Will watch your condition. Will get help right away if you are not doing well or get worse. Document  Released: 12/31/2006 Document Revised: 11/12/2011 Document Reviewed: 03/03/2007 [facility] Patient Information 2014 [place], [place].   ________________________________________________________________________

## 2021-07-13 ENCOUNTER — Encounter (HOSPITAL_COMMUNITY)
Admission: RE | Admit: 2021-07-13 | Discharge: 2021-07-13 | Disposition: A | Discharge status: Home / Self Care | Payer: BC Managed Care – PPO | Source: Ambulatory Visit | Attending: General Surgery | Admitting: General Surgery

## 2021-07-13 ENCOUNTER — Other Ambulatory Visit: Payer: Self-pay

## 2021-07-13 ENCOUNTER — Encounter (HOSPITAL_COMMUNITY): Payer: Self-pay

## 2021-07-13 VITALS — BP 126/82 | HR 79 | Temp 97.8°F | Ht 68.0 in | Wt 173.0 lb

## 2021-07-13 DIAGNOSIS — Z932 Ileostomy status: Secondary | ICD-10-CM | POA: Insufficient documentation

## 2021-07-13 DIAGNOSIS — Z01812 Encounter for preprocedural laboratory examination: Secondary | ICD-10-CM | POA: Diagnosis not present

## 2021-07-13 LAB — CBC
HCT: 41.9 % (ref 39.0–52.0)
Hemoglobin: 14.4 g/dL (ref 13.0–17.0)
MCH: 32.4 pg (ref 26.0–34.0)
MCHC: 34.4 g/dL (ref 30.0–36.0)
MCV: 94.4 fL (ref 80.0–100.0)
Platelets: 206 10*3/uL (ref 150–400)
RBC: 4.44 MIL/uL (ref 4.22–5.81)
RDW: 14.9 % (ref 11.5–15.5)
WBC: 4.8 10*3/uL (ref 4.0–10.5)
nRBC: 0 % (ref 0.0–0.2)

## 2021-07-13 NOTE — Progress Notes (Signed)
COVID Vaccine Completed: Yes Date COVID Vaccine completed: 08/06/20 x 3 COVID vaccine manufacturer: [employer] Test: 08/01/21  PCP - Dr. Billey Gosling Cardiologist -   Chest x-ray - CT Chest: 11/17/20 EKG -  Stress Test -  ECHO -  Cardiac Cath -  Pacemaker/ICD device last checked:  Sleep Study -  CPAP -   Fasting Blood Sugar -  Checks Blood Sugar _____ times a day  Blood Thinner Instructions: Aspirin Instructions: Last Dose:  Anesthesia review:   Patient denies shortness of breath, fever, cough and chest pain at PAT appointment   Patient verbalized understanding of instructions that were given to them at the PAT appointment. Patient was also instructed that they will need to review over the PAT instructions again at home before surgery.

## 2021-07-18 ENCOUNTER — Ambulatory Visit (HOSPITAL_COMMUNITY)
Admission: RE | Admit: 2021-07-18 | Discharge: 2021-07-18 | Disposition: A | Discharge status: Home / Self Care | Payer: BC Managed Care – PPO | Source: Ambulatory Visit | Attending: Hematology | Admitting: Hematology

## 2021-07-18 ENCOUNTER — Inpatient Hospital Stay: Payer: BC Managed Care – PPO | Attending: Nurse Practitioner

## 2021-07-18 ENCOUNTER — Other Ambulatory Visit: Payer: Self-pay

## 2021-07-18 DIAGNOSIS — E611 Iron deficiency: Secondary | ICD-10-CM | POA: Insufficient documentation

## 2021-07-18 DIAGNOSIS — Z9889 Other specified postprocedural states: Secondary | ICD-10-CM | POA: Diagnosis not present

## 2021-07-18 DIAGNOSIS — R918 Other nonspecific abnormal finding of lung field: Secondary | ICD-10-CM | POA: Diagnosis not present

## 2021-07-18 DIAGNOSIS — D1803 Hemangioma of intra-abdominal structures: Secondary | ICD-10-CM | POA: Diagnosis not present

## 2021-07-18 DIAGNOSIS — C20 Malignant neoplasm of rectum: Secondary | ICD-10-CM | POA: Insufficient documentation

## 2021-07-18 DIAGNOSIS — M160 Bilateral primary osteoarthritis of hip: Secondary | ICD-10-CM | POA: Diagnosis not present

## 2021-07-18 LAB — CBC WITH DIFFERENTIAL (CANCER CENTER ONLY)
Abs Immature Granulocytes: 0.02 10*3/uL (ref 0.00–0.07)
Basophils Absolute: 0 10*3/uL (ref 0.0–0.1)
Basophils Relative: 0 %
Eosinophils Absolute: 0.1 10*3/uL (ref 0.0–0.5)
Eosinophils Relative: 2 %
HCT: 41.1 % (ref 39.0–52.0)
Hemoglobin: 14.3 g/dL (ref 13.0–17.0)
Immature Granulocytes: 0 %
Lymphocytes Relative: 16 %
Lymphs Abs: 0.7 10*3/uL (ref 0.7–4.0)
MCH: 32.3 pg (ref 26.0–34.0)
MCHC: 34.8 g/dL (ref 30.0–36.0)
MCV: 92.8 fL (ref 80.0–100.0)
Monocytes Absolute: 0.5 10*3/uL (ref 0.1–1.0)
Monocytes Relative: 10 %
Neutro Abs: 3.3 10*3/uL (ref 1.7–7.7)
Neutrophils Relative %: 72 %
Platelet Count: 191 10*3/uL (ref 150–400)
RBC: 4.43 MIL/uL (ref 4.22–5.81)
RDW: 14.5 % (ref 11.5–15.5)
WBC Count: 4.7 10*3/uL (ref 4.0–10.5)
nRBC: 0 % (ref 0.0–0.2)

## 2021-07-18 LAB — CMP (CANCER CENTER ONLY)
ALT: 21 U/L (ref 0–44)
AST: 20 U/L (ref 15–41)
Albumin: 3.9 g/dL (ref 3.5–5.0)
Alkaline Phosphatase: 93 U/L (ref 38–126)
Anion gap: 10 (ref 5–15)
BUN: 13 mg/dL (ref 6–20)
CO2: 23 mmol/L (ref 22–32)
Calcium: 9.1 mg/dL (ref 8.9–10.3)
Chloride: 106 mmol/L (ref 98–111)
Creatinine: 1.15 mg/dL (ref 0.61–1.24)
GFR, Estimated: >60 mL/min (ref >60)
Glucose, Bld: 97 mg/dL (ref 70–99)
Potassium: 4.2 mmol/L (ref 3.5–5.1)
Sodium: 139 mmol/L (ref 135–145)
Total Bilirubin: 0.4 mg/dL (ref 0.3–1.2)
Total Protein: 7 g/dL (ref 6.5–8.1)

## 2021-07-18 MED ORDER — IOHEXOL 350 MG/ML SOLN
80.0000 mL | Freq: Once | INTRAVENOUS | Status: AC | PRN
  Administered 2021-07-18: 80 mL via INTRAVENOUS

## 2021-07-20 ENCOUNTER — Other Ambulatory Visit (HOSPITAL_COMMUNITY): Payer: Self-pay

## 2021-07-21 ENCOUNTER — Encounter: Payer: Self-pay | Admitting: Hematology

## 2021-07-21 ENCOUNTER — Inpatient Hospital Stay (HOSPITAL_BASED_OUTPATIENT_CLINIC_OR_DEPARTMENT_OTHER): Payer: BC Managed Care – PPO | Admitting: Hematology

## 2021-07-21 ENCOUNTER — Other Ambulatory Visit: Payer: Self-pay

## 2021-07-21 VITALS — BP 138/87 | HR 82 | Temp 98.0°F | Resp 18 | Ht 68.0 in | Wt 176.5 lb

## 2021-07-21 DIAGNOSIS — E611 Iron deficiency: Secondary | ICD-10-CM | POA: Diagnosis not present

## 2021-07-21 DIAGNOSIS — C20 Malignant neoplasm of rectum: Secondary | ICD-10-CM

## 2021-07-21 NOTE — Progress Notes (Signed)
[place]   [phone] Fax:([phone]   Clinic Follow up Note   Patient Care Team: [name], MD as PCP - General (Internal Medicine) [name], NP as Nurse Practitioner (Nurse Practitioner) [name], MD as Consulting Physician (Oncology)  Date of Service:  07/21/2021  CHIEF COMPLAINT: f/u of rectal cancer  CURRENT THERAPY:  Adjuvant Xeloda, 1500 mg in the morning/2000 mg in the evening, starting 04/03/21; increase to 2000 mg BID 04/24/21  ASSESSMENT & PLAN:  Cesar Herrera is a 50 y.o. male with   1. Adenocarcinoma of the rectum, cT3N0/N1, stage II or III,ypT1N0 -He was diagnosed in 11/2020 by colonoscopy which showed upper/mid rectal adenocarcinoma. -Staging work-up shows no evidence of distant metastatic disease (and 1.4 cm benign hemangioma in right lobe of liver) -Local staging pelvic MRI shows T3N1 disease, however the node is a single 5 mm mesorectal lymph node and could be reactive, could be N0. I spoke with radiology about this -He completed standard neoadjuvant concurrent chemo/RT with Xeloda 12/05/20-01/11/21.  -He proceeded to resection on 03/10/21 under Dr. [name]. Pathology showed 2 mm residual tumor, all negative lymph nodes (0/13). Loop ileostomy performed at that time.  -He began adjuvant Xeloda on 04/03/21. He tolerated well with mild fatigue. He completed Xeloda on 07/09/21 -posttreatment CT CAP 07/18/21 showed only postsurgical change, the presacral soft tissue is likely scar tissue, although cancer recurrence can not be completely ruled out.  I personally reviewed the scan image and discussed with patient and his wife in detail.  Plan to review in our GI tumor board next week also.  I recommend I close follow-up with CT scan in 3 to 4 months, unless tumor board recommends differently.  -he is scheduled for ileostomy reversal on 08/03/21.   3. Genetics -His genetic testing from 12/23/20 was negative for pathogenetic mutations. 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 Iron deficiency -he has started oral iron in 05/2021 -will monitor      PLAN:  -proceed with ileostomy reversal on 08/03/21 -lab and f/u in 6-8 weeks -will review his recent CT scan in our GI tumor board next week.   No problem-specific Assessment & Plan notes found for this encounter.   SUMMARY OF ONCOLOGIC HISTORY: Oncology History Overview Note   Cancer Staging  Rectal adenocarcinoma [facility]) Staging form: Colon and Rectum, AJCC 8th Edition - Clinical stage from 11/23/2020: Stage IIIB (cT3, cN1, cM0) - Signed by [name], NP on 11/23/2020 Stage prefix: Initial diagnosis - Pathologic stage from 03/10/2021: Stage I (ypT1, pN0, cM0) - Signed by [name], MD on 05/31/2021 Stage prefix: Post-therapy Response to neoadjuvant therapy: Partial response Total positive nodes: 0 Histologic grading system: 4 grade system Histologic grade (G): G2 Residual tumor (R): R0 - None    Rectal adenocarcinoma ([place])  11/11/2020 Procedure   Colonoscopy by Dr. Ardis Hughs Impression - Three 2 to 5 mm polyps in the descending colon, in the transverse colon and in the cecum, removed with a cold snare. Complete resection. 2/3 polyps retrieved, sent to pathology. - Rectal tumor that appears malignant, distal edge 7cm from the anal verge. This was biopsied and then labeled with Spot submucosal tattoo. - The examination was otherwise normal on direct and retroflexion views.   11/11/2020 Initial Biopsy   Diagnosis 1. Descending Colon Polyp - TUBULAR ADENOMA (3 OF 3 FRAGMENTS) - NO HIGH-GRADE DYSPLASIA OR MALIGNANCY IDENTIFIED 2. Rectum, biopsy - ADENOCARCINOMA, AT LEAST INTRAMUCOSAL   11/11/2020  Tumor Marker   CEA: 1.4 CA 19-9: 10   11/15/2020 Imaging   Pelvic MRI for local staging IMPRESSION: Signs of potential T3 B disease, tortuosity of the rectum at the level of the tumor makes assessment difficult, distorted by the polypoid eccentric tumor in the high rectum as  described.   N1 disease.   No extramural venous invasion. Tumor extends just to the level of the APR in this center below this level.   11/17/2020 Imaging   CT CAP IMPRESSION: 1. Known rectal mass with a 5 mm in short axis left mesorectal lymph node better shown on the prior exam and only faintly apparent on the CT. 2. 1.4 by 1.2 by 0.9 cm hypodense lesion in the right hepatic lobe on image 50 of series 2, poorly seen on the delayed images. This lesion is indeterminate by CT and could be benign or malignant. Imaging possibilities for with further workup might include hepatic protocol MRI with and without contrast or PET-CT. 3. Lucent lesion of the left posterior L2 vertebral body potentially extending slightly into the pedicle, measuring 1.9 by 1.4 cm on image 116 of series 5. This previously measured 1.6 by 1.3 cm on 03/28/2010, accordingly making it unlikely to be a metastatic lesion. 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 4. Small right middle lobe and right lower lobe pulmonary nodules are likely benign but merit surveillance in this context.   11/18/2020 Initial Diagnosis   Rectal adenocarcinoma ([place])   11/23/2020 Cancer Staging   Staging form: Colon and Rectum, AJCC 8th Edition - Clinical stage from 11/23/2020: Stage IIIB (cT3, cN1, cM0) - Signed by [name], NP on 11/23/2020 Stage prefix: Initial diagnosis    12/02/2020 Imaging   MRI Liver  IMPRESSION: Tiny benign hemangioma in the right hepatic lobe, which corresponds to the lesion seen on recent CT. No evidence of metastatic disease within the liver or abdomen.   12/05/2020 - 01/11/2021 Radiation Therapy   Concurrent chemoradiation with Dr Lisbeth Renshaw    12/05/2020 - 01/11/2021 Chemotherapy   Concurrent chemoradiation with Xeloda 2050m in the AM and 15061min the PM M-F on days of Radiation   12/23/2020 Genetic Testing   Negative genetic testing:  No pathogenic  variants detected on the [facility] CancerNext-Expanded + RNAinsight panel. 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 The report date is 12/23/2020.  The CancerNext-Expanded + RNAinsight gene panel offered by [employer] includes sequencing and rearrangement analysis for the following 77 genes: AIP, ALK, APC, ATM, AXIN2, BAP1, BARD1, BLM, BMPR1A, BRCA1, BRCA2, BRIP1, CDC73, CDH1, CDK4, CDKN1B, CDKN2A, CHEK2, CTNNA1, DICER1, FANCC, FH, FLCN, GALNT12, KIF1B, LZTR1, MAX, MEN1, MET, MLH1, MSH2, MSH3, MSH6, MUTYH, NBN, NF1, NF2, NTHL1, PALB2, PHOX2B, PMS2, POT1, PRKAR1A, PTCH1, PTEN, RAD51C, RAD51D, RB1, RECQL, RET, SDHA, SDHAF2, SDHB, SDHC, SDHD, SMAD4, SMARCA4, SMARCB1, SMARCE1, STK11, SUFU, TMEM127, TP53, TSC1, TSC2, VHL and XRCC2 (sequencing and deletion/duplication); EGFR, EGLN1, HOXB13, KIT, MITF, PDGFRA, POLD1 and POLE (sequencing only); EPCAM and GREM1 (deletion/duplication only). RNA data is routinely analyzed for use in variant interpretation for all genes.    03/10/2021 Surgery   XI ROBOTIC ASSISTED LOWER ANTERIOR RESECTION WITH LOOP ILEOSTOMY, Dr. ThMarcello MooresFINAL MICROSCOPIC DIAGNOSIS:   A. COLON, RECTOSIGMOID, RESECTION:  - Invasive adenocarcinoma, moderately differentiated, spanning 2 mm.  - Tumor invades the submucosa.  - Resection margins are negative.  - No carcinoma in thirteen  of thirteen lymph nodes (0/13).  - See oncology table.   B. FINAL DISTAL MARGIN:  - Benign colonic mucosa.  - No dysplasia or malignancy.    03/10/2021 Cancer Staging   Staging form: Colon and Rectum, AJCC 8th Edition - Pathologic stage from 03/10/2021: Stage I (ypT1, pN0, cM0) - Signed by [name], MD on 05/31/2021 Stage prefix: Post-therapy Response to neoadjuvant therapy: Partial response Total positive nodes: 0 Histologic grading system: 4 grade system Histologic grade (G): G2 Residual tumor (R): R0 - None    07/18/2021 Imaging   EXAM: CT  CHEST, ABDOMEN, AND PELVIS WITH CONTRAST  IMPRESSION: 1. Postsurgical changes of low anterior resection and loop ileostomy formation with edema/soft tissue thickening along the presacral space measuring up to 12 mm in thickness, favored to reflect postsurgical/post treatment change. Attention on follow-up exams is recommended. 2. No evidence of metastatic disease within the chest, abdomen, or pelvis. 3. Stable small right middle lobe and right lower lobe pulmonary nodules, likely benign. 4. Stable 1.4 cm benign hepatic hemangioma.      INTERVAL HISTORY:  HEMAN QUE is here for a follow up of rectal cancer. He was last seen by me on 06/23/21. He presents to the clinic accompanied by his wife. He reports doing well overall.   All other systems were reviewed with the patient and are negative.  MEDICAL HISTORY:  Past Medical History:  Diagnosis Date   Cancer [facility])    Family history of bladder cancer    Family history of lymphoma    H/O epididymitis 2011   Seasonal allergies     SURGICAL HISTORY: Past Surgical History:  Procedure Laterality Date   COLONOSCOPY     VASECTOMY     WISDOM TOOTH EXTRACTION     XI ROBOTIC ASSISTED LOWER ANTERIOR RESECTION N/A 03/10/2021   Procedure: XI ROBOTIC ASSISTED LOWER ANTERIOR RESECTION WITH LOOP ILEOSTOMY;  Surgeon: Leighton Ruff, MD;  Location: [facility]S;  Service: General;  Laterality: N/A;    I have reviewed the social history and family history with the patient and they are unchanged from previous note.  ALLERGIES:  is allergic to erythromycin and penicillins.  MEDICATIONS:  Current Outpatient Medications  Medication Sig Dispense Refill   ALPRAZolam (XANAX) 0.25 MG tablet Take 1 tablet (0.25 mg total) by mouth 2 (two) times daily as needed for anxiety. 30 tablet 0   capecitabine (XELODA) 500 MG tablet Take 4 tablets (2,000 mg total) by mouth in morning and 4 tablets (2,000 mg total) by mouth in evening. Take within 30 minutes after  meals. Take for 14 days on and 7 days off. (Patient not taking: No sig reported) 112 tablet 0   ferrous sulfate 325 (65 FE) MG tablet Take 325 mg by mouth daily with breakfast.     loperamide (IMODIUM) 2 MG capsule Take 1 capsule (2 mg total) by mouth 4 (four) times daily - after meals and at bedtime. (Patient not taking: No sig reported) 120 capsule 1   Multiple Vitamins-Minerals (MULTIVITAMIN) tablet Take 1 tablet by mouth daily.     No current facility-administered medications for this visit.   Facility-Administered Medications Ordered in Other Visits  Medication Dose Route Frequency Provider Last Rate Last Admin   clindamycin (CLEOCIN) 900 mg in dextrose 5 % 50 mL IVPB  900 mg Intravenous 60 min Pre-Op Leighton Ruff, MD       And   gentamicin (GARAMYCIN) 380 mg in dextrose 5 % 50 mL IVPB  5 mg/kg  Intravenous 60 min Pre-Op Leighton Ruff, MD       gentamicin (GARAMYCIN) 5 mg/kg in dextrose 5 % 50 mL IVPB  5 mg/kg Intravenous 60 min Pre-Op Leighton Ruff, MD        PHYSICAL EXAMINATION: ECOG PERFORMANCE STATUS: 0 - Asymptomatic  Vitals:   07/21/21 1412  BP: 138/87  Pulse: 82  Resp: 18  Temp: 98 F (36.7 C)  SpO2: 99%   Wt Readings from Last 3 Encounters:  07/21/21 176 lb 8 oz (80.1 kg)  07/13/21 173 lb (78.5 kg)  06/23/21 174 lb 9.6 oz (79.2 kg)     GENERAL:alert, no distress and comfortable SKIN: skin color normal, no rashes or significant lesions EYES: normal, Conjunctiva are pink and non-injected, sclera clear  NEURO: alert & oriented x 3 with fluent speech  LABORATORY DATA:  I have reviewed the data as listed CBC Latest Ref Rng & Units 07/18/2021 07/13/2021 06/23/2021  WBC 4.0 - 10.5 K/uL 4.7 4.8 6.2  Hemoglobin 13.0 - 17.0 g/dL 14.3 14.4 14.7  Hematocrit 39.0 - 52.0 % 41.1 41.9 42.4  Platelets 150 - 400 K/uL 191 206 219     CMP Latest Ref Rng & Units 07/18/2021 06/23/2021 05/31/2021  Glucose 70 - 99 mg/dL 97 136(H) 133(H)  BUN 6 - 20 mg/dL 13 15 12    Creatinine 0.61 - 1.24 mg/dL 1.15 1.04 1.21  Sodium 135 - 145 mmol/L 139 139 138  Potassium 3.5 - 5.1 mmol/L 4.2 4.2 4.1  Chloride 98 - 111 mmol/L 106 106 105  CO2 22 - 32 mmol/L 23 25 24   Calcium 8.9 - 10.3 mg/dL 9.1 9.1 9.8  Total Protein 6.5 - 8.1 g/dL 7.0 7.4 7.0  Total Bilirubin 0.3 - 1.2 mg/dL 0.4 0.4 0.5  Alkaline Phos 38 - 126 U/L 93 83 93  AST 15 - 41 U/L 20 21 16   ALT 0 - 44 U/L 21 25 18       RADIOGRAPHIC STUDIES: I have personally reviewed the radiological images as listed and agreed with the findings in the report. No results found.    No orders of the defined types were placed in this encounter.  All questions were answered. The patient knows to call the clinic with any problems, questions or concerns. No barriers to learning was detected. The total time spent in the appointment was 30 minutes.     [name], MD 07/21/2021   I, Wilburn Mylar, am acting as scribe for [name], MD.   I have reviewed the above documentation for accuracy and completeness, and I agree with the above.

## 2021-07-26 ENCOUNTER — Other Ambulatory Visit: Payer: Self-pay

## 2021-07-26 NOTE — Progress Notes (Signed)
Cesar Herrera called inquiring to the out come of [facility] discussion related to his Ct scan.  I let him know the group felt the edema was treatment related and not disease.  All questions were answered.  He verbalized understanding.

## 2021-07-26 NOTE — Progress Notes (Signed)
The proposed treatment discussed in conference is for discussion purpose only and is not a binding recommendation.  The patients have not been physically examined, or presented with their treatment options.  Therefore, final treatment plans cannot be decided.  

## 2021-08-01 ENCOUNTER — Other Ambulatory Visit: Payer: Self-pay | Admitting: General Surgery

## 2021-08-01 LAB — SARS CORONAVIRUS 2 (TAT 6-24 HRS): SARS Coronavirus 2: NEGATIVE

## 2021-08-03 ENCOUNTER — Inpatient Hospital Stay (HOSPITAL_COMMUNITY): Payer: BC Managed Care – PPO | Admitting: Certified Registered Nurse Anesthetist

## 2021-08-03 ENCOUNTER — Encounter (HOSPITAL_COMMUNITY): Payer: Self-pay | Admitting: General Surgery

## 2021-08-03 ENCOUNTER — Encounter (HOSPITAL_COMMUNITY)
Admission: RE | Disposition: A | Discharge status: Home / Self Care | Payer: Self-pay | Source: Home / Self Care | Attending: General Surgery

## 2021-08-03 ENCOUNTER — Other Ambulatory Visit: Payer: Self-pay

## 2021-08-03 ENCOUNTER — Inpatient Hospital Stay (HOSPITAL_COMMUNITY)
Admission: RE | Admit: 2021-08-03 | Discharge: 2021-08-07 | DRG: 331 | Disposition: A | Discharge status: Home / Self Care | Payer: BC Managed Care – PPO | Attending: General Surgery | Admitting: General Surgery

## 2021-08-03 ENCOUNTER — Other Ambulatory Visit (HOSPITAL_COMMUNITY): Payer: Self-pay

## 2021-08-03 DIAGNOSIS — Z833 Family history of diabetes mellitus: Secondary | ICD-10-CM

## 2021-08-03 DIAGNOSIS — Z8371 Family history of colonic polyps: Secondary | ICD-10-CM | POA: Diagnosis not present

## 2021-08-03 DIAGNOSIS — R110 Nausea: Secondary | ICD-10-CM | POA: Diagnosis not present

## 2021-08-03 DIAGNOSIS — Z87891 Personal history of nicotine dependence: Secondary | ICD-10-CM

## 2021-08-03 DIAGNOSIS — C20 Malignant neoplasm of rectum: Secondary | ICD-10-CM | POA: Diagnosis not present

## 2021-08-03 DIAGNOSIS — Z932 Ileostomy status: Secondary | ICD-10-CM

## 2021-08-03 DIAGNOSIS — Z8719 Personal history of other diseases of the digestive system: Secondary | ICD-10-CM

## 2021-08-03 DIAGNOSIS — Z432 Encounter for attention to ileostomy: Principal | ICD-10-CM

## 2021-08-03 DIAGNOSIS — Z800 Family history of malignant neoplasm of digestive organs: Secondary | ICD-10-CM | POA: Diagnosis not present

## 2021-08-03 DIAGNOSIS — Z23 Encounter for immunization: Secondary | ICD-10-CM

## 2021-08-03 DIAGNOSIS — Z85048 Personal history of other malignant neoplasm of rectum, rectosigmoid junction, and anus: Secondary | ICD-10-CM | POA: Diagnosis not present

## 2021-08-03 DIAGNOSIS — K9410 Enterostomy complication, unspecified: Secondary | ICD-10-CM | POA: Diagnosis not present

## 2021-08-03 HISTORY — PX: LAPAROSCOPY: SHX197

## 2021-08-03 HISTORY — PX: ILEOSTOMY CLOSURE: SHX1784

## 2021-08-03 SURGERY — CLOSURE, ILEOSTOMY
Anesthesia: General | Site: Abdomen

## 2021-08-03 MED ORDER — CLINDAMYCIN PHOSPHATE 900 MG/50ML IV SOLN
INTRAVENOUS | Status: AC
  Filled 2021-08-03: qty 50

## 2021-08-03 MED ORDER — ROCURONIUM BROMIDE 10 MG/ML (PF) SYRINGE
PREFILLED_SYRINGE | INTRAVENOUS | Status: AC
  Filled 2021-08-03: qty 10

## 2021-08-03 MED ORDER — FENTANYL CITRATE (PF) 100 MCG/2ML IJ SOLN
INTRAMUSCULAR | Status: AC
  Filled 2021-08-03: qty 2

## 2021-08-03 MED ORDER — DEXAMETHASONE SODIUM PHOSPHATE 10 MG/ML IJ SOLN
INTRAMUSCULAR | Status: AC
  Filled 2021-08-03: qty 1

## 2021-08-03 MED ORDER — ALVIMOPAN 12 MG PO CAPS
12.0000 mg | ORAL_CAPSULE | ORAL | Status: AC
  Administered 2021-08-03: 12 mg via ORAL
  Filled 2021-08-03: qty 1

## 2021-08-03 MED ORDER — CLINDAMYCIN PHOSPHATE 900 MG/50ML IV SOLN
900.0000 mg | Freq: Three times a day (TID) | INTRAVENOUS | Status: AC
  Administered 2021-08-03: 900 mg via INTRAVENOUS
  Filled 2021-08-03: qty 50

## 2021-08-03 MED ORDER — MIDAZOLAM HCL 5 MG/5ML IJ SOLN
INTRAMUSCULAR | Status: DC | PRN
  Administered 2021-08-03: 4 mg via INTRAVENOUS

## 2021-08-03 MED ORDER — MIDAZOLAM HCL 2 MG/2ML IJ SOLN
INTRAMUSCULAR | Status: AC
  Filled 2021-08-03: qty 2

## 2021-08-03 MED ORDER — ENSURE SURGERY PO LIQD
237.0000 mL | Freq: Two times a day (BID) | ORAL | Status: DC
  Administered 2021-08-04 – 2021-08-06 (×2): 237 mL via ORAL

## 2021-08-03 MED ORDER — BUPIVACAINE-EPINEPHRINE (PF) 0.25% -1:200000 IJ SOLN
INTRAMUSCULAR | Status: AC
  Filled 2021-08-03: qty 30

## 2021-08-03 MED ORDER — DEXAMETHASONE SODIUM PHOSPHATE 10 MG/ML IJ SOLN
INTRAMUSCULAR | Status: DC | PRN
  Administered 2021-08-03: 8 mg via INTRAVENOUS

## 2021-08-03 MED ORDER — LIDOCAINE HCL (PF) 2 % IJ SOLN
INTRAMUSCULAR | Status: DC | PRN
  Administered 2021-08-03: 40 mg via INTRADERMAL

## 2021-08-03 MED ORDER — ALUM & MAG HYDROXIDE-SIMETH 200-200-20 MG/5ML PO SUSP
30.0000 mL | Freq: Four times a day (QID) | ORAL | Status: DC | PRN
  Administered 2021-08-03: 30 mL via ORAL
  Filled 2021-08-03: qty 30

## 2021-08-03 MED ORDER — HYDROMORPHONE HCL 1 MG/ML IJ SOLN: 0.5000 mg | INTRAMUSCULAR | Status: DC | PRN

## 2021-08-03 MED ORDER — GABAPENTIN 300 MG PO CAPS
300.0000 mg | ORAL_CAPSULE | Freq: Two times a day (BID) | ORAL | Status: DC
  Administered 2021-08-03 – 2021-08-07 (×8): 300 mg via ORAL
  Filled 2021-08-03 (×8): qty 1

## 2021-08-03 MED ORDER — LIDOCAINE HCL (PF) 2 % IJ SOLN
INTRAMUSCULAR | Status: AC
  Filled 2021-08-03: qty 5

## 2021-08-03 MED ORDER — KETAMINE HCL 10 MG/ML IJ SOLN
INTRAMUSCULAR | Status: AC
  Filled 2021-08-03: qty 1

## 2021-08-03 MED ORDER — OXYCODONE HCL 5 MG PO TABS: 5.0000 mg | ORAL_TABLET | Freq: Once | ORAL | Status: DC | PRN

## 2021-08-03 MED ORDER — ALVIMOPAN 12 MG PO CAPS
12.0000 mg | ORAL_CAPSULE | Freq: Two times a day (BID) | ORAL | Status: DC
  Administered 2021-08-04 – 2021-08-05 (×4): 12 mg via ORAL
  Filled 2021-08-03 (×4): qty 1

## 2021-08-03 MED ORDER — PROPOFOL 10 MG/ML IV BOLUS
INTRAVENOUS | Status: DC | PRN
  Administered 2021-08-03: 130 mg via INTRAVENOUS

## 2021-08-03 MED ORDER — ONDANSETRON HCL 4 MG/2ML IJ SOLN
4.0000 mg | Freq: Four times a day (QID) | INTRAMUSCULAR | Status: DC | PRN
  Administered 2021-08-03: 4 mg via INTRAVENOUS
  Filled 2021-08-03: qty 2

## 2021-08-03 MED ORDER — AMISULPRIDE (ANTIEMETIC) 5 MG/2ML IV SOLN
10.0000 mg | Freq: Once | INTRAVENOUS | Status: AC
  Administered 2021-08-03: 10 mg via INTRAVENOUS

## 2021-08-03 MED ORDER — INFLUENZA VAC SPLIT QUAD 0.5 ML IM SUSY: 0.5000 mL | PREFILLED_SYRINGE | INTRAMUSCULAR | Status: DC

## 2021-08-03 MED ORDER — ENOXAPARIN SODIUM 40 MG/0.4ML IJ SOSY
40.0000 mg | PREFILLED_SYRINGE | INTRAMUSCULAR | Status: DC
  Administered 2021-08-04 – 2021-08-07 (×4): 40 mg via SUBCUTANEOUS
  Filled 2021-08-03 (×4): qty 0.4

## 2021-08-03 MED ORDER — ACETAMINOPHEN 160 MG/5ML PO SOLN: 1000.0000 mg | Freq: Once | ORAL | Status: DC | PRN

## 2021-08-03 MED ORDER — FENTANYL CITRATE PF 50 MCG/ML IJ SOSY
PREFILLED_SYRINGE | INTRAMUSCULAR | Status: AC
  Filled 2021-08-03: qty 1

## 2021-08-03 MED ORDER — ONDANSETRON HCL 4 MG PO TABS: 4.0000 mg | ORAL_TABLET | Freq: Four times a day (QID) | ORAL | Status: DC | PRN

## 2021-08-03 MED ORDER — FENTANYL CITRATE PF 50 MCG/ML IJ SOSY
25.0000 ug | PREFILLED_SYRINGE | INTRAMUSCULAR | Status: DC | PRN
  Administered 2021-08-03: 25 ug via INTRAVENOUS

## 2021-08-03 MED ORDER — DIPHENHYDRAMINE HCL 25 MG PO CAPS: 25.0000 mg | ORAL_CAPSULE | Freq: Four times a day (QID) | ORAL | Status: DC | PRN

## 2021-08-03 MED ORDER — CLINDAMYCIN PHOSPHATE 900 MG/50ML IV SOLN: 900.0000 mg | Freq: Once | INTRAVENOUS | Status: DC

## 2021-08-03 MED ORDER — ENSURE PRE-SURGERY PO LIQD
296.0000 mL | Freq: Once | ORAL | Status: DC
  Filled 2021-08-03: qty 296

## 2021-08-03 MED ORDER — GABAPENTIN 300 MG PO CAPS
300.0000 mg | ORAL_CAPSULE | ORAL | Status: AC
  Administered 2021-08-03: 300 mg via ORAL
  Filled 2021-08-03: qty 1

## 2021-08-03 MED ORDER — GENTAMICIN SULFATE 40 MG/ML IJ SOLN
5.0000 mg/kg | Freq: Once | INTRAVENOUS | Status: DC
  Filled 2021-08-03: qty 10

## 2021-08-03 MED ORDER — SODIUM CHLORIDE 0.9 % IV SOLN
12.5000 mg | Freq: Four times a day (QID) | INTRAVENOUS | Status: DC | PRN
  Administered 2021-08-03: 12.5 mg via INTRAVENOUS
  Filled 2021-08-03: qty 12.5
  Filled 2021-08-03: qty 0.5

## 2021-08-03 MED ORDER — KCL IN DEXTROSE-NACL 20-5-0.45 MEQ/L-%-% IV SOLN
INTRAVENOUS | Status: DC
  Filled 2021-08-03: qty 1000

## 2021-08-03 MED ORDER — BUPIVACAINE-EPINEPHRINE (PF) 0.25% -1:200000 IJ SOLN
INTRAMUSCULAR | Status: DC | PRN
  Administered 2021-08-03: 30 mL

## 2021-08-03 MED ORDER — LACTATED RINGERS IV SOLN: INTRAVENOUS | Status: DC

## 2021-08-03 MED ORDER — FENTANYL CITRATE (PF) 100 MCG/2ML IJ SOLN
INTRAMUSCULAR | Status: DC | PRN
  Administered 2021-08-03 (×4): 50 ug via INTRAVENOUS

## 2021-08-03 MED ORDER — 0.9 % SODIUM CHLORIDE (POUR BTL) OPTIME
TOPICAL | Status: DC | PRN
  Administered 2021-08-03: 2000 mL

## 2021-08-03 MED ORDER — PROPOFOL 10 MG/ML IV BOLUS
INTRAVENOUS | Status: AC
  Filled 2021-08-03: qty 20

## 2021-08-03 MED ORDER — PHENYLEPHRINE HCL (PRESSORS) 10 MG/ML IV SOLN
INTRAVENOUS | Status: DC | PRN
  Administered 2021-08-03: 120 ug via INTRAVENOUS
  Administered 2021-08-03: 40 ug via INTRAVENOUS

## 2021-08-03 MED ORDER — CHLORHEXIDINE GLUCONATE 0.12 % MT SOLN
15.0000 mL | Freq: Once | OROMUCOSAL | Status: AC
  Administered 2021-08-03: 15 mL via OROMUCOSAL

## 2021-08-03 MED ORDER — TRAMADOL HCL 50 MG PO TABS
50.0000 mg | ORAL_TABLET | Freq: Four times a day (QID) | ORAL | Status: DC | PRN
  Administered 2021-08-04: 50 mg via ORAL
  Administered 2021-08-04 – 2021-08-06 (×3): 100 mg via ORAL
  Filled 2021-08-03: qty 2
  Filled 2021-08-03: qty 1
  Filled 2021-08-03 (×3): qty 2

## 2021-08-03 MED ORDER — ACETAMINOPHEN 500 MG PO TABS
1000.0000 mg | ORAL_TABLET | ORAL | Status: AC
  Administered 2021-08-03: 1000 mg via ORAL
  Filled 2021-08-03: qty 2

## 2021-08-03 MED ORDER — ENSURE PRE-SURGERY PO LIQD
592.0000 mL | Freq: Once | ORAL | Status: DC
  Filled 2021-08-03: qty 592

## 2021-08-03 MED ORDER — BUPIVACAINE LIPOSOME 1.3 % IJ SUSP
INTRAMUSCULAR | Status: AC
  Filled 2021-08-03: qty 20

## 2021-08-03 MED ORDER — PHENYLEPHRINE 40 MCG/ML (10ML) SYRINGE FOR IV PUSH (FOR BLOOD PRESSURE SUPPORT)
PREFILLED_SYRINGE | INTRAVENOUS | Status: AC
  Filled 2021-08-03: qty 10

## 2021-08-03 MED ORDER — ACETAMINOPHEN 500 MG PO TABS: 1000.0000 mg | ORAL_TABLET | Freq: Once | ORAL | Status: DC | PRN

## 2021-08-03 MED ORDER — ONDANSETRON HCL 4 MG/2ML IJ SOLN
INTRAMUSCULAR | Status: AC
  Filled 2021-08-03: qty 2

## 2021-08-03 MED ORDER — BUPIVACAINE LIPOSOME 1.3 % IJ SUSP
INTRAMUSCULAR | Status: DC | PRN
  Administered 2021-08-03: 20 mL

## 2021-08-03 MED ORDER — AMISULPRIDE (ANTIEMETIC) 5 MG/2ML IV SOLN
INTRAVENOUS | Status: AC
  Filled 2021-08-03: qty 2

## 2021-08-03 MED ORDER — ORAL CARE MOUTH RINSE: 15.0000 mL | Freq: Once | OROMUCOSAL | Status: AC

## 2021-08-03 MED ORDER — OXYCODONE HCL 5 MG/5ML PO SOLN: 5.0000 mg | Freq: Once | ORAL | Status: DC | PRN

## 2021-08-03 MED ORDER — ROCURONIUM BROMIDE 10 MG/ML (PF) SYRINGE
PREFILLED_SYRINGE | INTRAVENOUS | Status: DC | PRN
  Administered 2021-08-03: 60 mg via INTRAVENOUS
  Administered 2021-08-03: 40 mg via INTRAVENOUS

## 2021-08-03 MED ORDER — ONDANSETRON HCL 4 MG/2ML IJ SOLN
INTRAMUSCULAR | Status: DC | PRN
  Administered 2021-08-03: 4 mg via INTRAVENOUS

## 2021-08-03 MED ORDER — KETAMINE HCL 10 MG/ML IJ SOLN
INTRAMUSCULAR | Status: DC | PRN
  Administered 2021-08-03: 30 mg via INTRAVENOUS
  Administered 2021-08-03: 20 mg via INTRAVENOUS

## 2021-08-03 MED ORDER — SUGAMMADEX SODIUM 200 MG/2ML IV SOLN
INTRAVENOUS | Status: DC | PRN
  Administered 2021-08-03: 200 mg via INTRAVENOUS

## 2021-08-03 MED ORDER — ACETAMINOPHEN 500 MG PO TABS
1000.0000 mg | ORAL_TABLET | Freq: Four times a day (QID) | ORAL | Status: DC
  Administered 2021-08-03 – 2021-08-07 (×13): 1000 mg via ORAL
  Filled 2021-08-03 (×13): qty 2

## 2021-08-03 MED ORDER — DIPHENHYDRAMINE HCL 50 MG/ML IJ SOLN: 25.0000 mg | Freq: Four times a day (QID) | INTRAMUSCULAR | Status: DC | PRN

## 2021-08-03 MED ORDER — SIMETHICONE 80 MG PO CHEW
40.0000 mg | CHEWABLE_TABLET | Freq: Four times a day (QID) | ORAL | Status: DC | PRN
  Administered 2021-08-04 – 2021-08-06 (×3): 40 mg via ORAL
  Filled 2021-08-03 (×6): qty 1

## 2021-08-03 MED ORDER — SACCHAROMYCES BOULARDII 250 MG PO CAPS
250.0000 mg | ORAL_CAPSULE | Freq: Two times a day (BID) | ORAL | Status: DC
  Administered 2021-08-03 – 2021-08-07 (×8): 250 mg via ORAL
  Filled 2021-08-03 (×8): qty 1

## 2021-08-03 MED ORDER — ACETAMINOPHEN 10 MG/ML IV SOLN: 1000.0000 mg | Freq: Once | INTRAVENOUS | Status: DC | PRN

## 2021-08-03 MED ORDER — BUPIVACAINE LIPOSOME 1.3 % IJ SUSP: 20.0000 mL | Freq: Once | INTRAMUSCULAR | Status: DC

## 2021-08-03 SURGICAL SUPPLY — 72 items
APPLIER CLIP 5 13 M/L LIGAMAX5 (MISCELLANEOUS)
APPLIER CLIP ROT 10 11.4 M/L (STAPLE)
BAG COUNTER SPONGE SURGICOUNT (BAG) IMPLANT
BLADE EXTENDED COATED 6.5IN (ELECTRODE) IMPLANT
BLADE HEX COATED 2.75 (ELECTRODE) ×2 IMPLANT
CHLORAPREP W/TINT 26 (MISCELLANEOUS) ×2 IMPLANT
CLIP APPLIE 5 13 M/L LIGAMAX5 (MISCELLANEOUS) IMPLANT
CLIP APPLIE ROT 10 11.4 M/L (STAPLE) IMPLANT
COVER MAYO STAND STRL (DRAPES) ×2 IMPLANT
DECANTER SPIKE VIAL GLASS SM (MISCELLANEOUS) ×2 IMPLANT
DERMABOND ADVANCED (GAUZE/BANDAGES/DRESSINGS)
DERMABOND ADVANCED .7 DNX12 (GAUZE/BANDAGES/DRESSINGS) IMPLANT
DRAPE LAPAROSCOPIC ABDOMINAL (DRAPES) ×2 IMPLANT
DRAPE UTILITY XL STRL (DRAPES) IMPLANT
DRAPE WARM FLUID 44X44 (DRAPES) ×2 IMPLANT
DRSG OPSITE POSTOP 4X10 (GAUZE/BANDAGES/DRESSINGS) IMPLANT
DRSG OPSITE POSTOP 4X6 (GAUZE/BANDAGES/DRESSINGS) IMPLANT
DRSG OPSITE POSTOP 4X8 (GAUZE/BANDAGES/DRESSINGS) IMPLANT
DRSG TELFA 3X8 NADH (GAUZE/BANDAGES/DRESSINGS) IMPLANT
ELECT REM PT RETURN 15FT ADLT (MISCELLANEOUS) ×2 IMPLANT
GAUZE SPONGE 4X4 12PLY STRL (GAUZE/BANDAGES/DRESSINGS) ×2 IMPLANT
GLOVE SURG ENC MOIS LTX SZ6.5 (GLOVE) ×4 IMPLANT
GLOVE SURG UNDER POLY LF SZ7 (GLOVE) ×2 IMPLANT
GOWN STRL REUS W/TWL XL LVL3 (GOWN DISPOSABLE) ×6 IMPLANT
HANDLE SUCTION POOLE (INSTRUMENTS) ×1 IMPLANT
HOLDER FOLEY CATH W/STRAP (MISCELLANEOUS) IMPLANT
IRRIG SUCT STRYKERFLOW 2 WTIP (MISCELLANEOUS) ×2
IRRIGATION SUCT STRKRFLW 2 WTP (MISCELLANEOUS) ×1 IMPLANT
KIT BASIN OR (CUSTOM PROCEDURE TRAY) ×2 IMPLANT
KIT TURNOVER KIT A (KITS) IMPLANT
MANIFOLD NEPTUNE II (INSTRUMENTS) ×2 IMPLANT
NEEDLE HYPO 22GX1.5 SAFETY (NEEDLE) ×2 IMPLANT
PACK COLON (CUSTOM PROCEDURE TRAY) ×2 IMPLANT
PACK GENERAL/GYN (CUSTOM PROCEDURE TRAY) ×2 IMPLANT
RELOAD PROXIMATE 75MM BLUE (ENDOMECHANICALS) ×4 IMPLANT
SEALER TISSUE G2 STRG ARTC 35C (ENDOMECHANICALS) IMPLANT
SET TUBE SMOKE EVAC HIGH FLOW (TUBING) ×2 IMPLANT
SLEEVE XCEL OPT CAN 5 100 (ENDOMECHANICALS) ×2 IMPLANT
SPONGE T-LAP 18X18 ~~LOC~~+RFID (SPONGE) IMPLANT
STAPLER GUN LINEAR PROX 60 (STAPLE) ×2 IMPLANT
STAPLER PROXIMATE 75MM BLUE (STAPLE) ×2 IMPLANT
STAPLER VISISTAT 35W (STAPLE) IMPLANT
SUCTION POOLE HANDLE (INSTRUMENTS) ×2
SUT NOVA 1 T20/GS 25DT (SUTURE) IMPLANT
SUT NOVA NAB DX-16 0-1 5-0 T12 (SUTURE) IMPLANT
SUT NOVA NAB GS-21 0 18 T12 DT (SUTURE) IMPLANT
SUT NOVA NAB GS-21 1 T12 (SUTURE) ×10 IMPLANT
SUT PDS AB 1 CT1 27 (SUTURE) ×4 IMPLANT
SUT PDS AB 1 TP1 96 (SUTURE) IMPLANT
SUT PROLENE 2 0 BLUE (SUTURE) IMPLANT
SUT PROLENE 2 0 KS (SUTURE) IMPLANT
SUT PROLENE 2 0 SH DA (SUTURE) IMPLANT
SUT SILK 2 0 (SUTURE) ×2
SUT SILK 2 0 SH CR/8 (SUTURE) ×2 IMPLANT
SUT SILK 2-0 18XBRD TIE 12 (SUTURE) ×1 IMPLANT
SUT SILK 3 0 (SUTURE) ×2
SUT SILK 3 0 SH CR/8 (SUTURE) ×2 IMPLANT
SUT SILK 3-0 18XBRD TIE 12 (SUTURE) ×1 IMPLANT
SUT VIC AB 2-0 SH 18 (SUTURE) IMPLANT
SUT VIC AB 2-0 SH 27 (SUTURE) ×4
SUT VIC AB 2-0 SH 27X BRD (SUTURE) ×2 IMPLANT
SUT VIC AB 4-0 PS2 18 (SUTURE) ×4 IMPLANT
SUT VICRYL 0 UR6 27IN ABS (SUTURE) ×2 IMPLANT
SYR 20ML LL LF (SYRINGE) ×2 IMPLANT
TOWEL OR 17X26 10 PK STRL BLUE (TOWEL DISPOSABLE) ×4 IMPLANT
TOWEL OR NON WOVEN STRL DISP B (DISPOSABLE) ×4 IMPLANT
TRAY FOLEY MTR SLVR 16FR STAT (SET/KITS/TRAYS/PACK) ×2 IMPLANT
TROCAR BLADELESS OPT 5 100 (ENDOMECHANICALS) ×2 IMPLANT
TROCAR XCEL BLUNT TIP 100MML (ENDOMECHANICALS) IMPLANT
TROCAR XCEL NON-BLD 11X100MML (ENDOMECHANICALS) ×2 IMPLANT
TUBING CONNECTING 10 (TUBING) ×4 IMPLANT
YANKAUER SUCT BULB TIP NO VENT (SUCTIONS) ×2 IMPLANT

## 2021-08-03 NOTE — Anesthesia Preprocedure Evaluation (Signed)
Anesthesia Evaluation  Patient identified by MRN, date of birth, ID band Patient awake    Reviewed: Allergy & Precautions, NPO status , Patient's Chart, lab work & pertinent test results  Airway Mallampati: II  TM Distance: >3 FB Neck ROM: Full    Dental  (+) Teeth Intact, Dental Advisory Given   Pulmonary neg shortness of breath, neg sleep apnea, neg COPD, neg recent URI, former smoker,    breath sounds clear to auscultation       Cardiovascular negative cardio ROS   Rhythm:Regular     Neuro/Psych PSYCHIATRIC DISORDERS Anxiety negative neurological ROS     GI/Hepatic Neg liver ROS, RECTAL CANCER   Endo/Other  negative endocrine ROS  Renal/GU negative Renal ROS     Musculoskeletal negative musculoskeletal ROS (+)   Abdominal   Peds  Hematology negative hematology ROS (+)   Anesthesia Other Findings   Reproductive/Obstetrics                             Anesthesia Physical Anesthesia Plan  ASA: 2  Anesthesia Plan: General   Post-op Pain Management: Celebrex PO (pre-op) and Gabapentin PO (pre-op)   Induction: Intravenous  PONV Risk Score and Plan: 2 and Ondansetron and Dexamethasone  Airway Management Planned: Oral ETT  Additional Equipment: None  Intra-op Plan:   Post-operative Plan: Extubation in OR  Informed Consent: I have reviewed the patients History and Physical, chart, labs and discussed the procedure including the risks, benefits and alternatives for the proposed anesthesia with the patient or authorized representative who has indicated his/her understanding and acceptance.     Dental advisory given  Plan Discussed with: CRNA and Anesthesiologist  Anesthesia Plan Comments:         Anesthesia Quick Evaluation

## 2021-08-03 NOTE — Anesthesia Procedure Notes (Signed)
Procedure Name: Intubation Date/Time: 08/03/2021 7:43 AM Performed by: Rosaland Lao, CRNA Pre-anesthesia Checklist: Patient identified, Emergency Drugs available, Suction available and Patient being monitored Patient Re-evaluated:Patient Re-evaluated prior to induction Oxygen Delivery Method: Circle system utilized Preoxygenation: Pre-oxygenation with 100% oxygen Induction Type: IV induction Ventilation: Mask ventilation without difficulty Laryngoscope Size: Miller and 2 Grade View: Grade I Tube type: Oral Number of attempts: 1 Airway Equipment and Method: Stylet Placement Confirmation: ETT inserted through vocal cords under direct vision, positive ETCO2 and breath sounds checked- equal and bilateral Secured at: 22 cm Tube secured with: Tape Dental Injury: Teeth and Oropharynx as per pre-operative assessment

## 2021-08-03 NOTE — Op Note (Signed)
08/03/2021  8:43 AM  PATIENT:  Cesar Herrera  50 y.o. male  Patient Care Team: [name], MD as PCP - General (Internal Medicine) [name], NP as Nurse Practitioner (Nurse Practitioner) [name], MD as Consulting Physician (Oncology)  PRE-OPERATIVE DIAGNOSIS:  RECTAL CANCER  POST-OPERATIVE DIAGNOSIS:  RECTAL CANCER  PROCEDURE:   ILEOSTOMY CLOSURE LAPAROSCOPY DIAGNOSTIC  Surgeon(s): Leighton Ruff, MD Michael Boston, MD  ASSISTANT: Dr Johney Maine   ANESTHESIA:   local and general  EBL:  84ml  DRAINS: none   SPECIMEN:  Source of Specimen:  ileostomy  DISPOSITION OF SPECIMEN:  PATHOLOGY  COUNTS:  YES  PLAN OF CARE: Admit to inpatient   PATIENT DISPOSITION:  PACU - hemodynamically stable.  INDICATION: 50 year old male status post low anterior resection for rectal cancer with diverting ileostomy.  He is here today for ileostomy reversal.  Gastrografin enema shows no sign of anastomotic leak and patient's anastomosis is patent without stricture   OR FINDINGS: Ileostomy present.  No signs of carcinomatosis in the proximal pelvis  DESCRIPTION: the patient was identified in the preoperative holding area and taken to the OR where they were laid supine on the operating room table.  General anesthesia was induced without difficulty. SCDs were also noted to be in place prior to the initiation of anesthesia.  A digital rectal exam revealed a patent anastomosis without stricture or recurrence.  The patient was then prepped and draped in the usual sterile fashion.   A surgical timeout was performed indicating the correct patient, procedure, positioning and need for preoperative antibiotics.   I began by making an incision around the ilio cutaneous junction using electrocautery.  Dissection was carried down through the subcutaneous tissues to the level of the fascia using blunt dissection and electrocautery.  The ostomy was separated from the fascia using electrocautery.  This  allowed mobilization of the small bowel out of the abdomen.  The ostomy was excised using a 75 mm GIA stapler x2.  An anastomosis was created between the proximal and distal limbs using a 75 mm blue load GIA stapler.  The common enterotomy was then closed using a 60 mm TA blue load stapler.  The mesenteric edges were closed using interrupted 2-0 silk sutures.  An antitension suture was placed in the crotch of the anastomosis using a 3-0 silk suture.  This was then placed back into the abdomen.  An Alexis wound protector was placed and the cap was placed over this.  The abdomen was insufflated.  We evaluated the entire abdomen in all 4 quadrants.  There were minimal adhesions.  There were significant pelvic adhesions which did not allow [place] to see deep into the pelvis but the proximal pelvis appeared without recurrence or masses.  The abdomen was then desufflated.  The [place] wound protector was removed.  The fascial edges were mobilized using electrocautery.  The fascia of the ileostomy site was then closed using interrupted #1 Novafil sutures.  The subcutaneous tissue was reapproximated over this using a 2-0 Vicryl pursestring suture.  A Telfa wick was placed in the middle and a sterile dressing was applied.  The Foley was removed.  The patient was then awakened from anesthesia and sent to the postanesthesia care unit in stable condition.  All counts were correct per operating room staff.

## 2021-08-03 NOTE — H&P (Signed)
The patient is a 50 year old male who presents with colorectal cancer.50 year old male who presented to the office for evaluation of a newly diagnosed rectal cancer earlier this year.  This was seen on colonoscopy performed due to change in bowel habits and occasional rectal bleeding.  A medium size mass was found in the mid rectum approximately 7 cm from the anal verge.  He underwent a LAR and diverting ileostomy.  He has healed well from surgery and is ready for ileostomy closure.  He did undergo a CT recently which showed some presacral thickening.     Problem List/Past Medical  RECTAL CANCER ([facility])    Past Surgical History  Colon Polyp Removal - Colonoscopy  Oral Surgery  Vasectomy    Diagnostic Studies History  Colonoscopy  within last year   Allergies  Penicillins  Erythromycin *DERMATOLOGICALS*  Allergies Reconciled    Medication History  Multiple Vitamin  (Oral) Active. No Current Medications  (Taken starting 01/18/2021) ALPRAZolam  (0.5MG  Tablet, Oral) Active. Capecitabine  (150MG  Tablet, Oral) Active.     Social History  Alcohol use  Moderate alcohol use. Caffeine use  Coffee. No drug use  Tobacco use  Former smoker.   Family History  Colon Polyps  Father. Diabetes Mellitus  Father.   Other Problems  Back Pain  Rectal Cancer          Review of Systems  General Not Present- Appetite Loss, Chills, Fatigue, Fever, Night Sweats, Weight Gain and Weight Loss. Skin Not Present- Change in Wart/Mole, Dryness, Hives, Jaundice, New Lesions, Non-Healing Wounds, Rash and Ulcer. HEENT Not Present- Earache, Hearing Loss, Hoarseness, Nose Bleed, Oral Ulcers, Ringing in the Ears, Seasonal Allergies, Sinus Pain, Sore Throat, Visual Disturbances, Wears glasses/contact lenses and Yellow Eyes. Respiratory Not Present- Bloody sputum, Chronic Cough, Difficulty Breathing, Snoring and Wheezing. Breast Not Present- Breast Mass, Breast Pain, Nipple Discharge and Skin  Changes. Cardiovascular Not Present- Chest Pain, Difficulty Breathing Lying Down, Leg Cramps, Palpitations, Rapid Heart Rate, Shortness of Breath and Swelling of Extremities. Gastrointestinal Present- Change in Bowel Habits. Not Present- Abdominal Pain, Bloating, Bloody Stool, Chronic diarrhea, Constipation, Difficulty Swallowing, Excessive gas, Gets full quickly at meals, Hemorrhoids, Indigestion, Nausea, Rectal Pain and Vomiting. Male Genitourinary Not Present- Blood in Urine, Change in Urinary Stream, Frequency, Impotence, Nocturia, Painful Urination, Urgency and Urine Leakage. Musculoskeletal Present- Back Pain. Not Present- Joint Pain, Joint Stiffness, Muscle Pain, Muscle Weakness and Swelling of Extremities. Neurological Not Present- Decreased Memory, Fainting, Headaches, Numbness, Seizures, Tingling, Tremor, Trouble walking and Weakness. Psychiatric Not Present- Anxiety, Bipolar, Change in Sleep Pattern, Depression, Fearful and Frequent crying. Endocrine Not Present- Cold Intolerance, Excessive Hunger, Hair Changes, Heat Intolerance and New Diabetes. Hematology Not Present- Blood Thinners, Easy Bruising, Excessive bleeding, Gland problems, HIV and Persistent Infections.     Vitals:   08/03/21 0537  BP: 121/80  Pulse: 80  Resp: 18  Temp: 98.1 F (36.7 C)  SpO2: 100%   General Mental Status - Alert. General Appearance - Cooperative.  CV RRR Lungs: CTA Abdomen Palpation/Percussion Palpation and Percussion of the abdomen reveal - Soft.   Rectal Anorectal Exam Internal - anastomosis intact without major stricture       Assessment & Plan    RECTAL CANCER ([facility]) Ileostomy present I have recommended ileostomy reversal and diagnostic laparoscopy to look at his pelvis.  There is a small chance we would get a biopsy.  Risks include bleeding, infection, hernia and anastomotic leak.  All questions were answered.

## 2021-08-03 NOTE — Transfer of Care (Signed)
Immediate Anesthesia Transfer of Care Note  Patient: Cesar Herrera  Procedure(s) Performed: ILEOSTOMY CLOSURE (Abdomen) LAPAROSCOPY DIAGNOSTIC (Abdomen)  Patient Location: PACU  Anesthesia Type:General  Level of Consciousness: awake, alert  and oriented  Airway & Oxygen Therapy: Patient Spontanous Breathing and Patient connected to face mask  Post-op Assessment: Report given to RN and Post -op Vital signs reviewed and stable  Post vital signs: Reviewed and stable  Last Vitals:  Vitals Value Taken Time  BP    Temp    Pulse    Resp    SpO2      Last Pain:  Vitals:   08/03/21 0537  TempSrc: Oral  PainSc: 0-No pain      Patients Stated Pain Goal: 4 (06/00/45 9977)  Complications: No notable events documented.

## 2021-08-04 ENCOUNTER — Other Ambulatory Visit (HOSPITAL_COMMUNITY): Payer: Self-pay

## 2021-08-04 LAB — CBC
HCT: 41.3 % (ref 39.0–52.0)
Hemoglobin: 14.3 g/dL (ref 13.0–17.0)
MCH: 32.4 pg (ref 26.0–34.0)
MCHC: 34.6 g/dL (ref 30.0–36.0)
MCV: 93.4 fL (ref 80.0–100.0)
Platelets: 203 K/uL (ref 150–400)
RBC: 4.42 MIL/uL (ref 4.22–5.81)
RDW: 12.8 % (ref 11.5–15.5)
WBC: 6.9 K/uL (ref 4.0–10.5)
nRBC: 0 % (ref 0.0–0.2)

## 2021-08-04 LAB — BASIC METABOLIC PANEL WITH GFR
Anion gap: 5 (ref 5–15)
BUN: 7 mg/dL (ref 6–20)
CO2: 25 mmol/L (ref 22–32)
Calcium: 8.6 mg/dL — ABNORMAL LOW (ref 8.9–10.3)
Chloride: 107 mmol/L (ref 98–111)
Creatinine, Ser: 1.03 mg/dL (ref 0.61–1.24)
GFR, Estimated: >60 mL/min
Glucose, Bld: 120 mg/dL — ABNORMAL HIGH (ref 70–99)
Potassium: 4 mmol/L (ref 3.5–5.1)
Sodium: 137 mmol/L (ref 135–145)

## 2021-08-04 LAB — SURGICAL PATHOLOGY

## 2021-08-04 MED ORDER — INFLUENZA VAC SPLIT QUAD 0.5 ML IM SUSY
0.5000 mL | PREFILLED_SYRINGE | INTRAMUSCULAR | Status: AC
  Administered 2021-08-07: 0.5 mL via INTRAMUSCULAR
  Filled 2021-08-04: qty 0.5

## 2021-08-04 MED ORDER — COVID-19MRNA BIVAL VACC PFIZER 30 MCG/0.3ML IM SUSP
0.3000 mL | Freq: Once | INTRAMUSCULAR | Status: DC
  Filled 2021-08-04: qty 0.3

## 2021-08-04 NOTE — Progress Notes (Signed)
1 Day Post-Op ileostomy reversal Subjective: Had some nausea after surgery, better this am, tolerating clears, no bowel function yet, has ambulated 3 times so far  Objective: Vital signs in last 24 hours: Temp:  [97.5 F (36.4 C)-98.6 F (37 C)] 97.9 F (36.6 C) (12/02 0514) Pulse Rate:  [56-93] 71 (12/02 0514) Resp:  [12-19] 18 (12/02 0514) BP: (114-147)/(72-89) 136/89 (12/02 0514) SpO2:  [97 %-100 %] 99 % (12/02 0514) Weight:  [79 kg-82.4 kg] 79 kg (12/02 0514)   Intake/Output from previous day: 12/01 0701 - 12/02 0700 In: 2371.8 [P.O.:550; I.V.:1721.8; IV Piggyback:100] Out: 2815 [Urine:2350; Emesis/NG output:450; Blood:15] Intake/Output this shift: No intake/output data recorded.   General appearance: alert and cooperative GI: soft, nondistended  Incision: no significant drainage  Lab Results:  Recent Labs    08/04/21 0444  WBC 6.9  HGB 14.3  HCT 41.3  PLT 203   BMET Recent Labs    08/04/21 0444  NA 137  K 4.0  CL 107  CO2 25  GLUCOSE 120*  BUN 7  CREATININE 1.03  CALCIUM 8.6*   PT/INR No results for input(s): LABPROT, INR in the last 72 hours. ABG No results for input(s): PHART, HCO3 in the last 72 hours.  Invalid input(s): PCO2, PO2  MEDS, Scheduled  acetaminophen  1,000 mg Oral Q6H   alvimopan  12 mg Oral BID   enoxaparin (LOVENOX) injection  40 mg Subcutaneous Q24H   feeding supplement  237 mL Oral BID BM   gabapentin  300 mg Oral BID   influenza vac split quadrivalent PF  0.5 mL Intramuscular Tomorrow-1000   saccharomyces boulardii  250 mg Oral BID    Studies/Results: No results found.  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

## 2021-08-04 NOTE — Progress Notes (Signed)
   08/04/21 1259  Mobility  Head of Bed Elevated  Self regulated  Activity Ambulated in hall  Range of Motion/Exercises All extremities;Active  Level of Assistance Independent  Distance Ambulated (ft) 700 ft  Mobility Response Tolerated well  Mobility performed by Mobility specialist  Transport method Ambulatory  $Mobility charge 1 Mobility   Pt agreeable to mobilizing this afternoon. Ambulated about 749ft in hall, tolerated well. No complaints. Left pt EOB with call bell at side. RN notified of session.   [place] Specialist Acute Rehab Services Office: [phone]

## 2021-08-05 NOTE — Anesthesia Postprocedure Evaluation (Signed)
Anesthesia Post Note  Patient: Cesar Herrera  Procedure(s) Performed: ILEOSTOMY CLOSURE (Abdomen) LAPAROSCOPY DIAGNOSTIC (Abdomen)     Patient location during evaluation: PACU Anesthesia Type: General Level of consciousness: awake and alert Pain management: pain level controlled Vital Signs Assessment: post-procedure vital signs reviewed and stable Respiratory status: spontaneous breathing, nonlabored ventilation, respiratory function stable and patient connected to nasal cannula oxygen Cardiovascular status: blood pressure returned to baseline and stable Postop Assessment: no apparent nausea or vomiting Anesthetic complications: no   No notable events documented.  Last Vitals:  Vitals:   08/05/21 0525 08/05/21 1405  BP: 125/79 (!) 141/86  Pulse: (!) 59 67  Resp: 16 18  Temp: 36.9 C 36.7 C  SpO2: 97% 98%    Last Pain:  Vitals:   08/05/21 1405  TempSrc: Oral  PainSc:                  Donique Hammonds

## 2021-08-05 NOTE — Progress Notes (Signed)
2 Days Post-Op ileostomy reversal Subjective: tolerating clears, had some BM's, ambulating well  Objective: Vital signs in last 24 hours: Temp:  [98.1 F (36.7 C)-98.4 F (36.9 C)] 98.4 F (36.9 C) (12/03 0525) Pulse Rate:  [59-64] 59 (12/03 0525) Resp:  [16] 16 (12/03 0525) BP: (125-146)/(79-88) 125/79 (12/03 0525) SpO2:  [97 %-99 %] 97 % (12/03 0525)   Intake/Output from previous day: 12/02 0701 - 12/03 0700 In: 3160 [P.O.:3160] Out: 800 [Urine:800] Intake/Output this shift: No intake/output data recorded.   General appearance: alert and cooperative GI: soft, nondistended  Incision: no significant drainage, wick removed  Lab Results:  Recent Labs    08/04/21 0444  WBC 6.9  HGB 14.3  HCT 41.3  PLT 203    BMET Recent Labs    08/04/21 0444  NA 137  K 4.0  CL 107  CO2 25  GLUCOSE 120*  BUN 7  CREATININE 1.03  CALCIUM 8.6*    PT/INR No results for input(s): LABPROT, INR in the last 72 hours. ABG No results for input(s): PHART, HCO3 in the last 72 hours.  Invalid input(s): PCO2, PO2  MEDS, Scheduled  acetaminophen  1,000 mg Oral Q6H   alvimopan  12 mg Oral BID   COVID-19 mRNA bivalent vaccine (Pfizer)  0.3 mL Intramuscular Once   enoxaparin (LOVENOX) injection  40 mg Subcutaneous Q24H   feeding supplement  237 mL Oral BID BM   gabapentin  300 mg Oral BID   influenza vac split quadrivalent PF  0.5 mL Intramuscular Tomorrow-1000   saccharomyces boulardii  250 mg Oral BID    Studies/Results: No results found.  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

## 2021-08-06 ENCOUNTER — Encounter (HOSPITAL_COMMUNITY): Payer: Self-pay | Admitting: General Surgery

## 2021-08-06 MED ORDER — LOPERAMIDE HCL 2 MG PO CAPS
2.0000 mg | ORAL_CAPSULE | ORAL | 1 refills | Status: DC | PRN
Start: 2021-08-06 — End: 2021-10-25

## 2021-08-06 MED ORDER — TRAMADOL HCL 50 MG PO TABS
50.0000 mg | ORAL_TABLET | Freq: Four times a day (QID) | ORAL | 0 refills | Status: DC | PRN
Start: 2021-08-06 — End: 2021-10-24

## 2021-08-06 NOTE — Discharge Summary (Addendum)
Physician Discharge Summary  Patient ID: Cesar Herrera MRN: [id number] DOB/AGE: 1970-09-26 50 y.o.  Admit date: 08/03/2021 Discharge date: 08/07/2021  Admission Diagnoses: ileostomy in place  Discharge Diagnoses:  Principal Problem:   Ileostomy in place [facility])   Discharged Condition: good  Hospital Course: Patient was admitted to the med surg floor after surgery.  Diet was advanced as tolerated.  Patient began to have bowel function on postop day 1.  By postop day 4, He was tolerating a solid diet and pain was controlled with oral medications.  He was urinating without difficulty and ambulating without assistance.  Patient was felt to be in stable condition for discharge to home.   Consults: None  Significant Diagnostic Studies: labs: cbc, bmet  Treatments: IV hydration, analgesia: acetaminophen, and surgery: ileostomy reversal  Discharge Exam: Blood pressure 122/78, pulse 65, temperature 98.1 F (36.7 C), temperature source Oral, resp. rate 18, height 5\' 8"  (1.727 m), weight 79.1 kg, SpO2 100 %. General appearance: alert and cooperative GI: normal findings: soft, non-tender Incision/Wound: clean  Disposition: home   Allergies as of 08/07/2021       Reactions   Erythromycin    ? Reaction as child   Penicillins    ? Reaction as child        Medication List     STOP taking these medications    capecitabine 500 MG tablet Commonly known as: XELODA       TAKE these medications    ALPRAZolam 0.25 MG tablet Commonly known as: XANAX Take 1 tablet (0.25 mg total) by mouth 2 (two) times daily as needed for anxiety.   ferrous sulfate 325 (65 FE) MG tablet Take 325 mg by mouth daily with breakfast.   loperamide 2 MG capsule Commonly known as: IMODIUM Take 1 capsule (2 mg total) by mouth as needed for diarrhea or loose stools. Once abd bloating has improved What changed:  when to take this reasons to take this additional instructions   multivitamin  tablet Take 1 tablet by mouth daily.   traMADol 50 MG tablet Commonly known as: ULTRAM Take 1-2 tablets (50-100 mg total) by mouth every 6 (six) hours as needed for moderate pain.        Follow-up Information     Leighton Ruff, MD. Schedule an appointment as soon as possible for a visit in 2 week(s).   Specialties: General Surgery, Colon and Rectal Surgery Contact information: [address] [place] [address] [phone]                 Signed: Rosario Adie 72/04/2059, 8:16 AM

## 2021-08-06 NOTE — Progress Notes (Signed)
3 Days Post-Op ileostomy reversal Subjective: tolerating soft diet, having BM's, ambulating well  Objective: Vital signs in last 24 hours: Temp:  [98.1 F (36.7 C)-98.5 F (36.9 C)] 98.3 F (36.8 C) (12/04 0607) Pulse Rate:  [62-69] 62 (12/04 0607) Resp:  [16-18] 17 (12/04 0607) BP: (131-141)/(79-88) 131/79 (12/04 0607) SpO2:  [96 %-98 %] 98 % (12/04 0607) Weight:  [79.1 kg] 79.1 kg (12/04 0607)   Intake/Output from previous day: 12/03 0701 - 12/04 0700 In: 1800 [P.O.:1800] Out: -  Intake/Output this shift: No intake/output data recorded.   General appearance: alert and cooperative GI: soft, less distended  Incision: no significant drainage, wick removed  Lab Results:  Recent Labs    08/04/21 0444  WBC 6.9  HGB 14.3  HCT 41.3  PLT 203    BMET Recent Labs    08/04/21 0444  NA 137  K 4.0  CL 107  CO2 25  GLUCOSE 120*  BUN 7  CREATININE 1.03  CALCIUM 8.6*    PT/INR No results for input(s): LABPROT, INR in the last 72 hours. ABG No results for input(s): PHART, HCO3 in the last 72 hours.  Invalid input(s): PCO2, PO2  MEDS, Scheduled  acetaminophen  1,000 mg Oral Q6H   alvimopan  12 mg Oral BID   COVID-19 mRNA bivalent vaccine (Pfizer)  0.3 mL Intramuscular Once   enoxaparin (LOVENOX) injection  40 mg Subcutaneous Q24H   feeding supplement  237 mL Oral BID BM   gabapentin  300 mg Oral BID   influenza vac split quadrivalent PF  0.5 mL Intramuscular Tomorrow-1000   saccharomyces boulardii  250 mg Oral BID    Studies/Results: No results found.  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

## 2021-08-06 NOTE — Discharge Instructions (Signed)
ABDOMINAL SURGERY: POST OP INSTRUCTIONS  DIET: see additional instructions below.  Follow a light bland diet the first 24 hours after arrival home, such as soup, liquids, crackers, etc.  Be sure to include lots of fluids daily.  Avoid fast food or heavy meals as your are more likely to get nauseated.  Do not eat any uncooked fruits or vegetables for the next 2 weeks as your colon heals. Take your usually prescribed home medications unless otherwise directed. PAIN CONTROL: Pain is best controlled by a usual combination of three different methods TOGETHER: Ice/Heat Over the counter pain medication Prescription pain medication Most patients will experience some swelling and bruising around the incisions.  Ice packs or heating pads (30-60 minutes up to 6 times a day) will help. Use ice for the first few days to help decrease swelling and bruising, then switch to heat to help relax tight/sore spots and speed recovery.  Some people prefer to use ice alone, heat alone, alternating between ice & heat.  Experiment to what works for you.  Swelling and bruising can take several weeks to resolve.   It is helpful to take an over-the-counter pain medication regularly for the first few weeks.  Choose one of the following that works best for you: Naproxen (Aleve, etc)  Two 220mg  tabs twice a day Ibuprofen (Advil, etc) Three 200mg  tabs four times a day (every meal & bedtime) Acetaminophen (Tylenol, etc) 500-650mg  four times a day (every meal & bedtime) A  prescription for pain medication (such as oxycodone, hydrocodone, etc) should be given to you upon discharge.  Take your pain medication as prescribed.  If you are having problems/concerns with the prescription medicine (does not control pain, nausea, vomiting, rash, itching, etc), please call us [phone] to see if we need to switch you to a different pain medicine that will work better for you and/or control your side effect better. If you need a refill on  your pain medication, please contact your pharmacy.  They will contact our office to request authorization. Prescriptions will not be filled after 5 pm or on week-ends. Avoid getting constipated.  Between the surgery and the pain medications, it is common to experience some constipation.  Increasing fluid intake and taking a fiber supplement (such as Metamucil, Citrucel, FiberCon, MiraLax, etc) 1-2 times a day regularly will usually help prevent this problem from occurring.  A mild laxative (prune juice, Milk of Magnesia, MiraLax, etc) should be taken according to package directions if there are no bowel movements after 48 hours.   Watch out for diarrhea.  If you have many loose bowel movements, simplify your diet to bland foods & liquids for a few days.  Stop any stool softeners and decrease your fiber supplement.  Switching to mild anti-diarrheal medications (Kayopectate, Pepto Bismol) can help.  If this worsens or does not improve, please call us. Wash / shower every day.  You may shower over the incision / wound.  Avoid baths until the skin is fully healed.  Continue to shower over incision(s) after the dressing is off. Change your dressing daily and as needed. ACTIVITIES as tolerated:   You may resume regular (light) daily activities beginning the next day--such as daily self-care, walking, climbing stairs--gradually increasing activities as tolerated.  If you can walk 30 minutes without difficulty, it is safe to try more intense activity such as jogging, treadmill, bicycling, low-impact aerobics, swimming, etc. Save the most intensive and strenuous activity for last such as sit-ups, heavy lifting,  contact sports, etc  Refrain from any heavy lifting or straining until you are off narcotics for pain control.   DO NOT PUSH THROUGH PAIN.  Let pain be your guide: If it hurts to do something, don't do it.  Pain is your body warning you to avoid that activity for another week until the pain goes down. You  may drive when you are no longer taking prescription pain medication, you can comfortably wear a seatbelt, and you can safely maneuver your car and apply brakes. You may have sexual intercourse when it is comfortable.  FOLLOW UP in our office Please call [facility] at [phone] to set up an appointment to see your surgeon in the office for a follow-up appointment approximately 1-2 weeks after your surgery. Make sure that you call for this appointment the day you arrive home to insure a convenient appointment time. 10. IF YOU HAVE DISABILITY OR FAMILY LEAVE FORMS, BRING THEM TO THE OFFICE FOR PROCESSING.  DO NOT GIVE THEM TO YOUR DOCTOR.   WHEN TO CALL us [phone]: Poor pain control Reactions / problems with new medications (rash/itching, nausea, etc)  Fever over 101.5 F (38.5 C) Inability to urinate Nausea and/or vomiting Worsening swelling or bruising Continued bleeding from incision. Increased pain, redness, or drainage from the incision  The clinic staff is available to answer your questions during regular business hours (8:30am-5pm).  Please don't hesitate to call and ask to speak to one of our nurses for clinical concerns.   A surgeon from [facility] is always on call at the hospitals   If you have a medical emergency, go to the nearest emergency room or call 911.    [facility], [place], [place], [place], [place]  [address] ? MAIN: [phone] ? TOLL FREE: [phone] ? FAX [phone] [email or web address]    Diet Since many people have frequent bowel movements right after the operation, following a diet can help reduce the number of stools. Incorporate new foods into your diet one at a time to see how they affect your stool output. You may find that some foods give you trouble initially, but that you can tolerate them better later.  Do not skip meals. This tends to make the stools more irritating and loose.   Balancing starches with foods that tend to give diarrhea is also helpful in controlling the frequency of bowel movements.  Once your colon has been removed, you will need more salt until your body adjusts to not having a colon. Pretzels and corn chips are good snacks.  Hot and spicy foods will probably burn on the way out and should be avoided if your anal area feels irritated.  Seeds and nuts also can be irritating.  Within three to nine months after surgery, your body will have started to adjust. At this point, you should try to eat all types of foods and see how they affect you. Some patients have tried a low glycemic index diet, which includes foods that are high in fiber and cause a slow rise in blood sugar, to help control their bowel movements. Resources on the Internet and at your [employer] are available for more information on this type of diet.  Fluids are important to prevent dehydration. Drink enough fluid so your urine is light yellow in color. Avoid fruit juices, carbonated beverages, drinks with caffeine and straws (swallowed air increases gas). Rather than drinking fluids with your meal, try drinking fluids at  the end of your meal, which may help slow down your stool.  Many people ask about vitamin supplements after their operation when they are limiting fruits and vegetables. It is fine to take vitamins, but they should be chewable or in liquid form, otherwise they may not be fully absorbed.  Stool Frequency About half of the patients have between five to eight stools a day. About 30 percent have nine to 12 stools a day. Older patients -- those over 75 -- have more stools than younger patients. Less than 8 percent of patients have less than four stools a day. About 9 percent have over 13 stools a day. As the reservoir adapts and stretches to its normal capacity, the number of stools you have per day should decrease. You will probably have many stools while you are in the hospital, but  these should lessen by the time you go home. Often the first stools are without your control while in the hospital, though this is resolved before you go home. The biggest increase in stools usually occurs after you begin eating. You can expect your number of bowel movements to decrease gradually. The stools will range from watery to a paste-like consistency. By watching your diet, you can avoid foods that tend to produce loose stools. The more frequent your bowel movements, the more itching and burning you will have around your anus. A combined approach of diet and medications, such as Imodium and Lomotil, may help decrease your number of bowel movements. Most people need to take these medications more frequently right after having their ileoanal reservoir procedure, but only use them occasionally as their reservoir function improves. For some people, adding a fiber supplement, such as Metamucil, Benefiber, Konsyl, Citracel or a generic equivalent, and taking them with half the recommended amount of water, can help thicken their stool and reduce the number of bowel movements. Nighttime Stool Frequency Stool frequency at night is a common problem and can be very disruptive of a good night's sleep. More than half of [facility] patients surveyed -- about 63 percent -- get up once or twice per night to pass stool, and about 24 percent of patients awaken three times or more during the night to have a bowel movement. This can be related to eating late, overeating or eating foods known to cause problems. Tips for decreasing the number of stools at night include: Don't eat late. Wait at least three to four hours after your last meal before going to bed.  Take an anti-diarrheal medication before going to bed.  Eat binding foods at dinner and avoid those foods that tend to cause diarrhea.  Don't overeat at dinner. Diarrhea Diarrhea occurs when your stool is very watery and more frequent than usual. Diarrhea can be  caused by eating certain types of foods, eating too much of any food, pouchitis and other illnesses like the flu. If you develop the flu early in your recovery and have diarrhea, you may become dehydrated and need to be hospitalized. Sometimes people have diarrhea because they don't have the enzyme called lactase needed to digest the sugar in milk products. If you feel bloated and have cramps and gas after drinking milk, try not drinking it and see if symptoms go away. You can also try fermented milk products, such as yogurt, hard cheese and buttermilk, as well as soy or goat's milk. Adding fiber supplements, such as Metamucil, Benefiber, Konsyl, Citracel or the generic equivalent, can help thicken your stool and reduce diarrhea.  Gatorade or other sport drinks can also be helpful in treating diarrhea by keeping you hydrated. Skin Care Until your body adjusts to your new internal pouch, you may experience some leakage of stool, especially at night. Liquid stool is very irritating to the skin around the anus, so it is important to keep this area clean and dry. Use soft, white, non-perfumed toilet tissue to blot gently after each bowel movement, drying the skin completely. Do not scratch, rub or wipe the skin. It may help to spray water from a squirt bottle. Some people find drying the skin with a blow dryer helps. For irritated skin around the anus, warm baths are very soothing. Do not apply anything to the skin that burns or irritates. Moist cotton balls may be better for wiping if your skin is very sore. Some people use baby wipes to cleanse themselves. Use a brand without alcohol or scents. Some people tuck a small piece of toilet tissue or a dry cotton ball near the anal opening to protect their skin from small amounts of leakage. Scented soaps and tissues should not be used since these may cause irritation. A protective ointment may be used after each bowel movement to keep the stool off the skin. You may  want to wear a protective pad to keep your clothing clean. Anal itching and burning are often not visible on the outside because the irritated area is actually on the inside. You can look with a mirror to see if your irritation is on the outside skin. Some of the itching and burning is a normal part of the internal healing and will go away in time. Some people have found Pepto Bismol taken by mouth is helpful for the burning. Sometimes people develop a yeast infection around the anal area. It happens more commonly when people are taking antibiotics, or in women around the time of their period. If you get an itchy red rash, you may need an anti-fungal cream or ointment. This is available over the counter. Occasionally people are allergic to the preparations they are using around their anus. This is not very common. Some products contain lanolin -- if you are allergic to this, check the ingredients of ointments before using. Incontinence Mild incontinence or leakage of stool from your J-pouch is a common problem that improves with time as your stool thickens, your pouch stretches and your sphincters become stronger. The incontinence is usually worse at night when your sphincters relax. The looser the stool, the more likely it is to leak. Some people accidentally pass stool when they are passing gas. In time, most people develop an awareness of whether they are passing stool or gas, but initially this can be a problem. It is also important to note that if you develop incontinence later on, you may have pouchitis. According to our survey, 60 percent of people occasionally pass stool without control, usually at night while in a deep sleep. About 8 percent occasionally pass some liquid stool during the day. About 35 percent notice staining of stool on their underwear at least once a month during the day or the night. Often this can be related to eating something that causes a problem, having pouchitis, overeating,  going to bed soon after eating, or just being in a very deep sleep. Medications, diet and careful skin care are all important during this period of adaptation. A small pad helps absorb leakage. Some people find sleeping on a small towel or pad also is helpful. Before  your operation, it is a good idea to buy more cotton underwear.

## 2021-08-06 NOTE — Plan of Care (Signed)
  Problem: Clinical Measurements: Goal: Postoperative complications will be avoided or minimized Outcome: Progressing   

## 2021-08-07 ENCOUNTER — Other Ambulatory Visit (HOSPITAL_COMMUNITY): Payer: Self-pay

## 2021-08-07 NOTE — Progress Notes (Signed)
Patient was given discharge instructions, and all questions were answered. Patient was stable for discharge and was walked to the main exit. 

## 2021-08-09 ENCOUNTER — Other Ambulatory Visit (HOSPITAL_COMMUNITY): Payer: Self-pay

## 2021-08-21 ENCOUNTER — Telehealth: Payer: Self-pay | Admitting: *Deleted

## 2021-08-21 NOTE — Telephone Encounter (Signed)
Transition Care Management Follow-up Telephone Call Date of discharge and from where: 08/07/21 [facility] How have you been since you were released from the hospital? "Doing well, trying to get used to everything" Any questions or concerns? No  Items Reviewed: Did the pt receive and understand the discharge instructions provided? Yes  Medications obtained and verified? Yes  Other? No  Any new allergies since your discharge? No  Dietary orders reviewed? Yes Do you have support at home? Yes   Home Care and Equipment/Supplies: Were home health services ordered? no If so, what is the name of the agency? N/a  Has the agency set up a time to come to the patient's home? not applicable Were any new equipment or medical supplies ordered?  No What is the name of the medical supply agency? N/a Were you able to get the supplies/equipment? not applicable Do you have any questions related to the use of the equipment or supplies? N/a  Functional Questionnaire: (I = Independent and D = Dependent) ADLs: I  Bathing/Dressing- I  Meal Prep- I  Eating- I  Maintaining continence- I  Transferring/Ambulation- I  Managing Meds- I  Follow up appointments reviewed:  PCP Hospital f/u appt confirmed? No  Pt saw surgeon 08/21/21 [facility] Hospital f/u appt confirmed? Yes  Scheduled to see surgeon on 08/21/21. Are transportation arrangements needed? No  If their condition worsens, is the pt aware to call PCP or go to the Emergency Dept.? Yes Was the patient provided with contact information for the PCP's office or ED? Yes Was to pt encouraged to call back with questions or concerns? Yes  Jacqlyn Larsen RNC, BSN RN Case Manager [facility] [phone]

## 2021-09-06 ENCOUNTER — Telehealth: Payer: Self-pay | Admitting: *Deleted

## 2021-09-06 DIAGNOSIS — C20 Malignant neoplasm of rectum: Secondary | ICD-10-CM

## 2021-09-06 NOTE — Telephone Encounter (Signed)
Lab orders placed.  

## 2021-09-07 ENCOUNTER — Other Ambulatory Visit: Payer: Self-pay

## 2021-09-07 ENCOUNTER — Inpatient Hospital Stay (HOSPITAL_BASED_OUTPATIENT_CLINIC_OR_DEPARTMENT_OTHER): Payer: BC Managed Care – PPO | Admitting: Hematology

## 2021-09-07 ENCOUNTER — Inpatient Hospital Stay: Payer: BC Managed Care – PPO | Attending: Nurse Practitioner

## 2021-09-07 ENCOUNTER — Encounter: Payer: Self-pay | Admitting: Hematology

## 2021-09-07 VITALS — BP 119/76 | HR 76 | Temp 98.1°F | Resp 18 | Ht 68.0 in | Wt 170.2 lb

## 2021-09-07 DIAGNOSIS — E611 Iron deficiency: Secondary | ICD-10-CM | POA: Insufficient documentation

## 2021-09-07 DIAGNOSIS — C20 Malignant neoplasm of rectum: Secondary | ICD-10-CM | POA: Diagnosis not present

## 2021-09-07 LAB — CBC WITH DIFFERENTIAL (CANCER CENTER ONLY)
Abs Immature Granulocytes: 0.01 10*3/uL (ref 0.00–0.07)
Basophils Absolute: 0 10*3/uL (ref 0.0–0.1)
Basophils Relative: 1 %
Eosinophils Absolute: 0.1 10*3/uL (ref 0.0–0.5)
Eosinophils Relative: 1 %
HCT: 40.7 % (ref 39.0–52.0)
Hemoglobin: 14.1 g/dL (ref 13.0–17.0)
Immature Granulocytes: 0 %
Lymphocytes Relative: 12 %
Lymphs Abs: 0.5 10*3/uL — ABNORMAL LOW (ref 0.7–4.0)
MCH: 31.6 pg (ref 26.0–34.0)
MCHC: 34.6 g/dL (ref 30.0–36.0)
MCV: 91.3 fL (ref 80.0–100.0)
Monocytes Absolute: 0.4 10*3/uL (ref 0.1–1.0)
Monocytes Relative: 9 %
Neutro Abs: 3.3 10*3/uL (ref 1.7–7.7)
Neutrophils Relative %: 77 %
Platelet Count: 199 10*3/uL (ref 150–400)
RBC: 4.46 MIL/uL (ref 4.22–5.81)
RDW: 11.8 % (ref 11.5–15.5)
WBC Count: 4.2 10*3/uL (ref 4.0–10.5)
nRBC: 0 % (ref 0.0–0.2)

## 2021-09-07 LAB — FERRITIN: Ferritin: 103 ng/mL (ref 24–336)

## 2021-09-07 LAB — CMP (CANCER CENTER ONLY)
ALT: 25 U/L (ref 0–44)
AST: 19 U/L (ref 15–41)
Albumin: 4.1 g/dL (ref 3.5–5.0)
Alkaline Phosphatase: 76 U/L (ref 38–126)
Anion gap: 5 (ref 5–15)
BUN: 13 mg/dL (ref 6–20)
CO2: 30 mmol/L (ref 22–32)
Calcium: 9.1 mg/dL (ref 8.9–10.3)
Chloride: 105 mmol/L (ref 98–111)
Creatinine: 1.01 mg/dL (ref 0.61–1.24)
GFR, Estimated: >60 mL/min (ref >60)
Glucose, Bld: 106 mg/dL — ABNORMAL HIGH (ref 70–99)
Potassium: 4.2 mmol/L (ref 3.5–5.1)
Sodium: 140 mmol/L (ref 135–145)
Total Bilirubin: 0.4 mg/dL (ref 0.3–1.2)
Total Protein: 7 g/dL (ref 6.5–8.1)

## 2021-09-07 LAB — IRON AND IRON BINDING CAPACITY (CC-WL,HP ONLY)
Iron: 73 ug/dL (ref 45–182)
Saturation Ratios: 19 % (ref 17.9–39.5)
TIBC: 393 ug/dL (ref 250–450)
UIBC: 320 ug/dL (ref 117–376)

## 2021-09-07 NOTE — Progress Notes (Signed)
[facility] [facility]   [phone] [phone]   Clinic Follow up Note   Patient Care Team: Cesar Rail, MD as PCP - General (Internal Medicine) Cesar Feeling, NP as Nurse Practitioner (Nurse Practitioner) Cesar Merle, MD as Consulting Physician (Oncology)  Date of Service:  09/07/2021  CHIEF COMPLAINT: f/u of rectal cancer  CURRENT THERAPY:  Adjuvant Xeloda, 1500 mg in the morning/2000 mg in the evening, starting 04/03/21; increase to 2000 mg BID 04/24/21  ASSESSMENT & PLAN:  Cesar Herrera is a 51 y.o. male with   1. Adenocarcinoma of the rectum, cT3N0/N1, stage II or III,ypT1N0 -He was diagnosed in 11/2020 by colonoscopy which showed upper/mid rectal adenocarcinoma. -Staging work-up shows no evidence of distant metastatic disease (and 1.4 cm benign hemangioma in right lobe of liver) -Local staging pelvic MRI shows T3N1 disease, however the node is a single 5 mm mesorectal lymph node and could be reactive, could be N0. I spoke with radiology about this -He completed standard neoadjuvant concurrent chemo/RT with Xeloda 12/05/20-01/11/21.  -He proceeded to resection on 03/10/21 under Dr. Marcello Herrera. Pathology showed 2 mm residual tumor, all negative lymph nodes (0/13). Loop ileostomy performed at that time.  -He began adjuvant Xeloda on 04/03/21. He tolerated well with mild fatigue. He completed Xeloda on 07/09/21 -posttreatment CT CAP 07/18/21 showed only postsurgical change, the presacral soft tissue is likely scar tissue. We discussed his scan in our tumor conference, and this is felt to be scar tissue. We will repeat scan in 6 weeks from now. -he had ileostomy reversal on 08/03/21, benign pathology. He is adjusting to bowel changes. I offered referral to PT for pelvic rehab; he is agreeable. -we discussed surveillance-- I will see him every 3-4 months; surveillance colonoscopy one year out from initial surgery; CT AP.   2. Genetics -His genetic testing from 12/23/20 was  negative for pathogenetic mutations. 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 Iron deficiency -he has started oral iron in 05/2021 -will monitor, today's results are pending     PLAN:  -referral to physical therapy -f/u in 6 weeks, with lab and CT AP a few days before   No problem-specific Assessment & Plan notes found for this encounter.   SUMMARY OF ONCOLOGIC HISTORY: Oncology History Overview Note   Cancer Staging  Rectal adenocarcinoma [facility]) Staging form: Colon and Rectum, AJCC 8th Edition - Clinical stage from 11/23/2020: Stage IIIB (cT3, cN1, cM0) - Signed by Cesar Feeling, NP on 11/23/2020 Stage prefix: Initial diagnosis - Pathologic stage from 03/10/2021: Stage I (ypT1, pN0, cM0) - Signed by Cesar Merle, MD on 05/31/2021 Stage prefix: Post-therapy Response to neoadjuvant therapy: Partial response Total positive nodes: 0 Histologic grading system: 4 grade system Histologic grade (G): G2 Residual tumor (R): R0 - None    Rectal adenocarcinoma ([place])  11/11/2020 Procedure   Colonoscopy by Dr. Ardis Herrera Impression - Three 2 to 5 mm polyps in the descending colon, in the transverse colon and in the cecum, removed with a cold snare. Complete resection. 2/3 polyps retrieved, sent to pathology. - Rectal tumor that appears malignant, distal edge 7cm from the anal verge. This was biopsied and then labeled with Spot submucosal tattoo. - The examination was otherwise normal on direct and retroflexion views.   11/11/2020 Initial Biopsy   Diagnosis 1. Descending Colon Polyp - TUBULAR ADENOMA (3 OF 3 FRAGMENTS) - NO HIGH-GRADE DYSPLASIA OR MALIGNANCY IDENTIFIED 2. Rectum, biopsy - ADENOCARCINOMA, AT LEAST INTRAMUCOSAL   11/11/2020  Tumor Marker   CEA: 1.4 CA 19-9: 10   11/15/2020 Imaging   Pelvic MRI for local staging IMPRESSION: Signs of potential T3 B disease, tortuosity of the rectum at the level of the tumor makes assessment difficult, distorted by the polypoid eccentric tumor in  the high rectum as described.   N1 disease.   No extramural venous invasion. Tumor extends just to the level of the APR in this center below this level.   11/17/2020 Imaging   CT CAP IMPRESSION: 1. Known rectal mass with a 5 mm in short axis left mesorectal lymph node better shown on the prior exam and only faintly apparent on the CT. 2. 1.4 by 1.2 by 0.9 cm hypodense lesion in the right hepatic lobe on image 50 of series 2, poorly seen on the delayed images. This lesion is indeterminate by CT and could be benign or malignant. Imaging possibilities for with further workup might include hepatic protocol MRI with and without contrast or PET-CT. 3. Lucent lesion of the left posterior L2 vertebral body potentially extending slightly into the pedicle, measuring 1.9 by 1.4 cm on image 116 of series 5. This previously measured 1.6 by 1.3 cm on 03/28/2010, accordingly making it unlikely to be a metastatic lesion. 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 4. Small right middle lobe and right lower lobe pulmonary nodules are likely benign but merit surveillance in this context.   11/18/2020 Initial Diagnosis   Rectal adenocarcinoma ([place])   11/23/2020 Cancer Staging   Staging form: Colon and Rectum, AJCC 8th Edition - Clinical stage from 11/23/2020: Stage IIIB (cT3, cN1, cM0) - Signed by Cesar Feeling, NP on 11/23/2020 Stage prefix: Initial diagnosis    12/02/2020 Imaging   MRI Liver  IMPRESSION: Tiny benign hemangioma in the right hepatic lobe, which corresponds to the lesion seen on recent CT. No evidence of metastatic disease within the liver or abdomen.   12/05/2020 - 01/11/2021 Radiation Therapy   Concurrent chemoradiation with Dr Cesar Herrera    12/05/2020 - 01/11/2021 Chemotherapy   Concurrent chemoradiation with Xeloda 2046m in the AM and 15054min the PM M-F on days of Radiation   12/23/2020 Genetic Testing   Negative genetic testing:   No pathogenic variants detected on the [employer] CancerNext-Expanded + RNAinsight panel. 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 The report date is 12/23/2020.  The CancerNext-Expanded + RNAinsight gene panel offered by [employer] includes sequencing and rearrangement analysis for the following 77 genes: AIP, ALK, APC, ATM, AXIN2, BAP1, BARD1, BLM, BMPR1A, BRCA1, BRCA2, BRIP1, CDC73, CDH1, CDK4, CDKN1B, CDKN2A, CHEK2, CTNNA1, DICER1, FANCC, FH, FLCN, GALNT12, KIF1B, LZTR1, MAX, MEN1, MET, MLH1, MSH2, MSH3, MSH6, MUTYH, NBN, NF1, NF2, NTHL1, PALB2, PHOX2B, PMS2, POT1, PRKAR1A, PTCH1, PTEN, RAD51C, RAD51D, RB1, RECQL, RET, SDHA, SDHAF2, SDHB, SDHC, SDHD, SMAD4, SMARCA4, SMARCB1, SMARCE1, STK11, SUFU, TMEM127, TP53, TSC1, TSC2, VHL and XRCC2 (sequencing and deletion/duplication); EGFR, EGLN1, HOXB13, KIT, MITF, PDGFRA, POLD1 and POLE (sequencing only); EPCAM and GREM1 (deletion/duplication only). RNA data is routinely analyzed for use in variant interpretation for all genes.    03/10/2021 Surgery   XI ROBOTIC ASSISTED LOWER ANTERIOR RESECTION WITH LOOP ILEOSTOMY, Dr. ThMarcello MooresFINAL MICROSCOPIC DIAGNOSIS:   A. COLON, RECTOSIGMOID, RESECTION:  - Invasive adenocarcinoma, moderately differentiated, spanning 2 mm.  - Tumor invades the submucosa.  - Resection margins are negative.  - No carcinoma in thirteen  of thirteen lymph nodes (0/13).  - See oncology table.   B. FINAL DISTAL MARGIN:  - Benign colonic mucosa.  - No dysplasia or malignancy.    03/10/2021 Cancer Staging   Staging form: Colon and Rectum, AJCC 8th Edition - Pathologic stage from 03/10/2021: Stage I (ypT1, pN0, cM0) - Signed by Cesar Merle, MD on 05/31/2021 Stage prefix: Post-therapy Response to neoadjuvant therapy: Partial response Total positive nodes: 0 Histologic grading system: 4 grade system Histologic grade (G): G2 Residual tumor (R): R0 - None    07/18/2021 Imaging    EXAM: CT CHEST, ABDOMEN, AND PELVIS WITH CONTRAST  IMPRESSION: 1. Postsurgical changes of low anterior resection and loop ileostomy formation with edema/soft tissue thickening along the presacral space measuring up to 12 mm in thickness, favored to reflect postsurgical/post treatment change. Attention on follow-up exams is recommended. 2. No evidence of metastatic disease within the chest, abdomen, or pelvis. 3. Stable small right middle lobe and right lower lobe pulmonary nodules, likely benign. 4. Stable 1.4 cm benign hepatic hemangioma.      INTERVAL HISTORY:  Cesar Herrera is here for a follow up of rectal cancer. He was last seen by me on 07/21/21. He presents to the clinic alone. He reports he is recovering well from surgery. He does report he has had to adjust to the new bowel movement changes and new diet. He notes he will go back and forth between diarrhea and constipation and adjusts his diet accordingly. He reports he has rectal urgency, though he does not actually pass stool every time.   All other systems were reviewed with the patient and are negative.  MEDICAL HISTORY:  Past Medical History:  Diagnosis Date   Cancer [facility])    Family history of bladder cancer    Family history of lymphoma    H/O epididymitis 2011   Seasonal allergies     SURGICAL HISTORY: Past Surgical History:  Procedure Laterality Date   COLONOSCOPY     ILEOSTOMY CLOSURE N/A 08/03/2021   Procedure: ILEOSTOMY CLOSURE;  Surgeon: Leighton Ruff, MD;  Location: [facility];  Service: General;  Laterality: N/A;   LAPAROSCOPY N/A 08/03/2021   Procedure: LAPAROSCOPY DIAGNOSTIC;  Surgeon: Leighton Ruff, MD;  Location: [facility];  Service: General;  Laterality: N/A;   VASECTOMY     WISDOM TOOTH EXTRACTION     XI ROBOTIC ASSISTED LOWER ANTERIOR RESECTION N/A 03/10/2021   Procedure: XI ROBOTIC ASSISTED LOWER ANTERIOR RESECTION WITH LOOP ILEOSTOMY;  Surgeon: Leighton Ruff, MD;  Location: [facility];  Service:  General;  Laterality: N/A;    I have reviewed the social history and family history with the patient and they are unchanged from previous note.  ALLERGIES:  is allergic to erythromycin and penicillins.  MEDICATIONS:  Current Outpatient Medications  Medication Sig Dispense Refill   ALPRAZolam (XANAX) 0.25 MG tablet Take 1 tablet (0.25 mg total) by mouth 2 (two) times daily as needed for anxiety. 30 tablet 0   ferrous sulfate 325 (65 FE) MG tablet Take 325 mg by mouth daily with breakfast.     loperamide (IMODIUM) 2 MG capsule Take 1 capsule (2 mg total) by mouth as needed for diarrhea or loose stools. Once abd bloating has improved 120 capsule 1   Multiple Vitamins-Minerals (MULTIVITAMIN) tablet Take 1 tablet by mouth daily.     traMADol (ULTRAM) 50 MG tablet Take 1-2 tablets (50-100 mg total) by mouth every 6 (six) hours as needed for moderate pain. 20 tablet 0  No current facility-administered medications for this visit.   Facility-Administered Medications Ordered in Other Visits  Medication Dose Route Frequency Provider Last Rate Last Admin   gentamicin (GARAMYCIN) 5 mg/kg in dextrose 5 % 50 mL IVPB  5 mg/kg Intravenous 60 min Pre-Op Leighton Ruff, MD        PHYSICAL EXAMINATION: ECOG PERFORMANCE STATUS: 1 - Symptomatic but completely ambulatory  Vitals:   09/07/21 1303  BP: 119/76  Pulse: 76  Resp: 18  Temp: 98.1 F (36.7 C)  SpO2: 100%   Wt Readings from Last 3 Encounters:  09/07/21 170 lb 3.2 oz (77.2 kg)  08/06/21 174 lb 6.1 oz (79.1 kg)  07/21/21 176 lb 8 oz (80.1 kg)     GENERAL:alert, no distress and comfortable SKIN: skin color normal, no rashes or significant lesions EYES: normal, Conjunctiva are pink and non-injected, sclera clear  NEURO: alert & oriented x 3 with fluent speech  LABORATORY DATA:  I have reviewed the data as listed CBC Latest Ref Rng & Units 09/07/2021 08/04/2021 07/18/2021  WBC 4.0 - 10.5 K/uL 4.2 6.9 4.7  Hemoglobin 13.0 - 17.0 g/dL 14.1  14.3 14.3  Hematocrit 39.0 - 52.0 % 40.7 41.3 41.1  Platelets 150 - 400 K/uL 199 203 191     CMP Latest Ref Rng & Units 09/07/2021 08/04/2021 07/18/2021  Glucose 70 - 99 mg/dL 106(H) 120(H) 97  BUN 6 - 20 mg/dL 13 7 13   Creatinine 0.61 - 1.24 mg/dL 1.01 1.03 1.15  Sodium 135 - 145 mmol/L 140 137 139  Potassium 3.5 - 5.1 mmol/L 4.2 4.0 4.2  Chloride 98 - 111 mmol/L 105 107 106  CO2 22 - 32 mmol/L 30 25 23   Calcium 8.9 - 10.3 mg/dL 9.1 8.6(L) 9.1  Total Protein 6.5 - 8.1 g/dL 7.0 - 7.0  Total Bilirubin 0.3 - 1.2 mg/dL 0.4 - 0.4  Alkaline Phos 38 - 126 U/L 76 - 93  AST 15 - 41 U/L 19 - 20  ALT 0 - 44 U/L 25 - 21      RADIOGRAPHIC STUDIES: I have personally reviewed the radiological images as listed and agreed with the findings in the report. No results found.    Orders Placed This Encounter  Procedures   CT ABDOMEN PELVIS W CONTRAST    Standing Status:   Future    Standing Expiration Date:   09/07/2022    Order Specific Question:   If indicated for the ordered procedure, I authorize the administration of contrast media per Radiology protocol    Answer:   Yes    Order Specific Question:   Preferred imaging location?    Answer:   [facility]    Order Specific Question:   Is Oral Contrast requested for this exam?    Answer:   Yes, Per Radiology protocol   Ambulatory referral to Physical Therapy    Referral Priority:   Routine    Referral Type:   Physical Medicine    Referral Reason:   Specialty Services Required    Requested Specialty:   Physical Therapy    Number of Visits Requested:   1   All questions were answered. The patient knows to call the clinic with any problems, questions or concerns. No barriers to learning was detected. The total time spent in the appointment was 30 minutes.     Cesar Merle, MD 09/07/2021   I, Wilburn Mylar, am acting as scribe for Cesar Merle, MD.   I have reviewed the  above documentation for accuracy and completeness, and I agree with  the above.

## 2021-09-26 ENCOUNTER — Ambulatory Visit: Payer: BC Managed Care – PPO | Attending: Hematology

## 2021-09-26 ENCOUNTER — Other Ambulatory Visit: Payer: Self-pay

## 2021-09-26 DIAGNOSIS — C20 Malignant neoplasm of rectum: Secondary | ICD-10-CM | POA: Insufficient documentation

## 2021-09-26 DIAGNOSIS — R279 Unspecified lack of coordination: Secondary | ICD-10-CM | POA: Insufficient documentation

## 2021-09-26 DIAGNOSIS — M6281 Muscle weakness (generalized): Secondary | ICD-10-CM | POA: Insufficient documentation

## 2021-09-26 NOTE — Therapy (Signed)
[place] @ [address] [place], [place], [address] Phone: [phone]   Fax:  [phone]  Physical Therapy Evaluation  Patient Details  Name: Cesar Herrera MRN: [id number] Date of Birth: 1971-03-26 Referring Provider (PT): [name], MD   Encounter Date: 09/26/2021   PT End of Session - 09/26/21 1548     Visit Number 1    Date for PT Re-Evaluation 12/19/21    Authorization Type BCBS    PT Start Time 9211    PT Stop Time 9417    PT Time Calculation (min) 48 min    Activity Tolerance Patient tolerated treatment well    Behavior During Therapy [facility] for tasks assessed/performed             Past Medical History:  Diagnosis Date   Cancer ([place])    Family history of bladder cancer    Family history of lymphoma    H/O epididymitis 2011   Seasonal allergies     Past Surgical History:  Procedure Laterality Date   COLONOSCOPY     ILEOSTOMY CLOSURE N/A 08/03/2021   Procedure: ILEOSTOMY CLOSURE;  Surgeon: Leighton Ruff, MD;  Location: [facility];  Service: General;  Laterality: N/A;   LAPAROSCOPY N/A 08/03/2021   Procedure: LAPAROSCOPY DIAGNOSTIC;  Surgeon: Leighton Ruff, MD;  Location: [facility];  Service: General;  Laterality: N/A;   VASECTOMY     WISDOM TOOTH EXTRACTION     XI ROBOTIC ASSISTED LOWER ANTERIOR RESECTION N/A 03/10/2021   Procedure: XI ROBOTIC ASSISTED LOWER ANTERIOR RESECTION WITH LOOP ILEOSTOMY;  Surgeon: Leighton Ruff, MD;  Location: [facility];  Service: General;  Laterality: N/A;    There were no vitals filed for this visit.    Subjective Assessment - 09/26/21 1447     Subjective Pt was diagnosed with colorectal cancer in the spring of 2022; he has bowel/rectum resection 03/10/21 and ileostomy with reversal on 08/03/2021. He reports that after reversal, he had to learn to gain control over his bowels again. He reports having mostly bad days at first, but now he is having mostly good days. He reports only  occasional loss of control over bowel. He has returned to work, walking regularly, and performing all ADLs without diffiuclty. He is used to being very active and would like to return to biking, hiking, and generally active lifestyle. He does feel like he has control and urgency has improved; he will still feel urgency, though. He watches his diet closely. He is not having any episodes of FI currently. He is taking fiber supplements which are helpful, but mostly controls consistency of stool with diet. He reports dull burning pain associated with urgency. He does have concern for hernia at site of ileostomy due to lump being there.    Pertinent History Colorectal cancer 11/02/20; bowel/resection and ileostomy 03/10/21; ileostomy reversal 08/03/2021; chemo/RT 12/05/20-01/11/21.    Currently in Pain? No/denies    Multiple Pain Sites No                [facility] PT Assessment - 09/26/21 0001       Assessment   Medical Diagnosis C20 (ICD-10-CM) - Rectal adenocarcinoma [facility])    Referring Provider (PT) [name], MD    Onset Date/Surgical Date 11/02/20    Prior Therapy no      Precautions   Precautions None      Restrictions   Weight Bearing Restrictions No      Balance Screen   Has  the patient fallen in the past 6 months No    Has the patient had a decrease in activity level because of a fear of falling?  No    Is the patient reluctant to leave their home because of a fear of falling?  No      Home Ecologist residence    Living Arrangements Spouse/significant other;Children      Prior Function   Level of Independence Independent    Vocation Full time employment      Cognition   Overall Cognitive Status Within Functional Limits for tasks assessed      Functional Tests   Functional tests Squat;Single leg stance      Squat   Comments Lt weight shift and heel lift      Single Leg Stance   Comments Unstable and compensated trendelenburg Bil       Posture/Postural Control   Posture Comments Forward head posture, thoracic kyphosis, decreased lumbar lordosis, Bil LE ER      ROM / Strength   AROM / PROM / Strength AROM;Strength      AROM   Overall AROM Comments Lumbar: flexion, Bil side bend, and Bil rotation all reduced by 50%; extension reduced by 75%      Strength   Overall Strength Comments All hip strength WFL with exception of extension - Bil 4-/5      Flexibility   Soft Tissue Assessment /Muscle Length yes   Hip flexor restriction, Rt>Lt, with pulling into abdomen     Palpation   Palpation comment Extensive abdominal scar tissue and restriction; at location of ileostomy, concern for hernia/area of weakness in scar tissue - not toelrated increased abdominal pressure well with notable bulging, increased in relaxed standing vs sitting vs supine      Special Tests   Other special tests ASLR: (-) Bil, but weakness, pelvic weight shift, and abdominal bulge observed with Bil leg lift; Sit-up test 0/3 with bulging noted      Bed Mobility   Bed Mobility Rolling Right;Rolling Left;Supine to Sit   Breath holding and abdominal bulging noted with all activities                       Objective measurements completed on examination: See above findings.     Pelvic Floor Special Questions - 09/26/21 0001     Prior Pelvic/Prostate Exam Yes    Currently Sexually Active No   not returned to since surgery; can get erection and ejaculate wihtout pain   Urinary Leakage No    Urinary urgency No    Urinary frequency no    Fecal incontinence No   no incontinence; 2x/day BM; urgency without haivng to actually go   Fluid intake States he drinks a lot of water    Caffeine beverages yes    Pelvic Floor Internal Exam Pt deferred today due to having more urgency    Exam Type --                       PT Education - 09/26/21 1547     Education Details Pt education performed on seeing MD to rule out abdominal hernia  or need for intervention at site of ileostomy; we discussed importance of core/hip strengthening to help support abdominal scar tissue, intra-abdominal pressure management, and scar tissue mobilization.    Person(s) Educated Patient    Methods Explanation;Demonstration;Tactile cues;Verbal cues    Comprehension  Verbalized understanding              PT Short Term Goals - 09/26/21 1600       PT SHORT TERM GOAL #1   Title Pt will be independent with HEP.    Time 4    Period Weeks    Status New    Target Date 10/24/21      PT SHORT TERM GOAL #2   Title Pt will be independent with pressure management during exercise and functional activity in order to reduce strain to abdominal scar tissue/    Time 4    Period Weeks    Status New    Target Date 10/24/21               PT Long Term Goals - 09/26/21 1601       PT LONG TERM GOAL #1   Title Pt will be independent with advanced HEP.    Time 12    Period Weeks    Status New    Target Date 12/19/21      PT LONG TERM GOAL #2   Title Pt will perform 2/3 on sit-up test with minimal abdominal bulging and appropriate pressure management in order to demonstrate improved core strength.    Time 12    Period Weeks    Status New    Target Date 12/19/21      PT LONG TERM GOAL #3   Title Pt will increase all lumbar A/ROM by 50% and hip extension strength by 1 grade in order to improve lumbopelvic mobility and support, reducing abdominal strain.    Time 12    Period Weeks    Status New    Target Date 12/19/21      PT LONG TERM GOAL #4   Title Pt will be independent with urge suppression technique and report <2 episodes of bowel urgency a week.    Time 12    Period Weeks    Status New    Target Date 12/19/21                    Plan - 09/26/21 1551     Clinical Impression Statement Pt is a 51 year old male with chief complaint of bowel urgency and weakness since last surgery (ileostomy reversal 08/03/21). Exam  findings notable for abdominal scar tissue restriction, area of abdominal bulging with increased pressure surorunding site of ileostomy, core weakness, decreased hip extension weakness, limited lumbar A/ROM in all directions, and impaired functional strength; pt deferred rectal exam this treatment session due to exacerbation of symptoms today - agreeable to one in the future. Signs and symptoms most consistent with core weakness, poor intra-abdominal pressure management, and abdominal scar tissue restriction; he was encouraged to consult MD to rule out hernia. Initial treatment included education on importance of pressure management, appropriate core strength, and scar tissue mobility to help regain full function and decrease bowel urgency. He will benefit from skilled PT intervention in order to address impairments, decrease urgency, and improve QOL.    Personal Factors and Comorbidities Comorbidity 1;Comorbidity 2;Comorbidity 3+    Comorbidities Colorectal cancer 11/02/20; bowel/resection and ileostomy 03/10/21; ileostomy reversal 08/03/2021; chemo/RT 12/05/20-01/11/21.    Examination-Activity Limitations Bed Mobility;Carry;Squat    Examination-Participation Restrictions Community Activity;Interpersonal Relationship    Stability/Clinical Decision Making Evolving/Moderate complexity    Clinical Decision Making Moderate    Rehab Potential Good    PT Frequency 1x / week    PT Duration  12 weeks    PT Treatment/Interventions ADLs/Self Care Home Management;Biofeedback;Cryotherapy;Electrical Stimulation;Moist Heat;Therapeutic activities;Therapeutic exercise;Neuromuscular re-education;Manual techniques;Patient/family education;Scar mobilization;Passive range of motion;Dry needling;Spinal Manipulations    PT Next Visit Plan Begin transversus abdominus training and exercise progressions with appropriate pressure management; manual techniques for abdominal scar tissue mobility; consider rectal exam to assess pelvic floor  if pt agreeable.    Consulted and Agree with Plan of Care Patient             Patient will benefit from skilled therapeutic intervention in order to improve the following deficits and impairments:  Decreased coordination, Decreased range of motion, Increased fascial restricitons, Impaired tone, Decreased endurance, Increased muscle spasms, Pain, Decreased activity tolerance, Decreased scar mobility, Hypomobility, Impaired flexibility, Postural dysfunction, Decreased mobility, Decreased strength  Visit Diagnosis: Muscle weakness (generalized)  Unspecified lack of coordination     Problem List Patient Active Problem List   Diagnosis Date Noted   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

## 2021-10-02 ENCOUNTER — Other Ambulatory Visit: Payer: Self-pay

## 2021-10-02 NOTE — Progress Notes (Signed)
Pt LVM asking about the CT Scan Dr. Burr Medico ordered on 09/07/2021.  Pt stated he has not heard from anyone regarding scheduling his CT Scan.  The expected date in Epic for the scan is 10/16/2021; therefore, based on [employer] new scheduling process, someone will be contacting the pt closer to the expected date have this scheduled.  Will contact pt on 10/03/2021 to inform him of this information and provide him with the telephone number to Central Scheduling.

## 2021-10-04 ENCOUNTER — Other Ambulatory Visit: Payer: Self-pay

## 2021-10-04 ENCOUNTER — Ambulatory Visit: Payer: BC Managed Care – PPO | Attending: Hematology

## 2021-10-04 DIAGNOSIS — M6281 Muscle weakness (generalized): Secondary | ICD-10-CM | POA: Insufficient documentation

## 2021-10-04 DIAGNOSIS — R279 Unspecified lack of coordination: Secondary | ICD-10-CM | POA: Insufficient documentation

## 2021-10-04 NOTE — Therapy (Signed)
[place] @ [place], [place], [address] Phone: [phone]   Fax:  [phone]  Physical Therapy Treatment  Patient Details  Name: Cesar Herrera MRN: [id number] Date of Birth: 08-25-1971 Referring Provider (PT): [name], MD   Encounter Date: 10/04/2021   PT End of Session - 10/04/21 1226     Visit Number 2    Date for PT Re-Evaluation 12/19/21    Authorization Type BCBS    PT Start Time 1148    PT Stop Time 1226    PT Time Calculation (min) 38 min    Activity Tolerance Patient tolerated treatment well    Behavior During Therapy [facility] for tasks assessed/performed             Past Medical History:  Diagnosis Date   Cancer ([place])    Family history of bladder cancer    Family history of lymphoma    H/O epididymitis 2011   Seasonal allergies     Past Surgical History:  Procedure Laterality Date   COLONOSCOPY     ILEOSTOMY CLOSURE N/A 08/03/2021   Procedure: ILEOSTOMY CLOSURE;  Surgeon: Leighton Ruff, MD;  [facility];  Service: General;  Laterality: N/A;   LAPAROSCOPY N/A 08/03/2021   Procedure: LAPAROSCOPY DIAGNOSTIC;  Surgeon: Leighton Ruff, MD;  Location: [facility];  Service: General;  Laterality: N/A;   VASECTOMY     WISDOM TOOTH EXTRACTION     XI ROBOTIC ASSISTED LOWER ANTERIOR RESECTION N/A 03/10/2021   Procedure: XI ROBOTIC ASSISTED LOWER ANTERIOR RESECTION WITH LOOP ILEOSTOMY;  Surgeon: Leighton Ruff, MD;  [facility];  Service: General;  Laterality: N/A;    There were no vitals filed for this visit.   Subjective Assessment - 10/04/21 1149     Subjective Pt states that he conitnues to improve every week and has had less urgency.    Pertinent History Colorectal cancer 11/02/20; bowel/resection and ileostomy 03/10/21; ileostomy reversal 08/03/2021; chemo/RT 12/05/20-01/11/21.    Patient Stated Goals Decrease urgency, increase pelvic floor/core strength    Currently in Pain? No/denies     Multiple Pain Sites No                               [facility] Adult PT Treatment/Exercise - 10/04/21 0001       Neuro Re-ed    Neuro Re-ed Details  Trasnversus abdominus training with VC/TCs and breath coordination; progressed t osupine march 2 x 10, leg extensions 2 x 10, and horizontal abduction with green band 2 x 10 all with VC/TCs to facilitate appropriate core motor control      Manual Therapy   Manual Therapy Myofascial release    Myofascial Release Abdominal scar tissue mobilization; abdominal myofascial release                     PT Education - 10/04/21 1211     Education Details Pt education performed on trasnversus activation and importance to help get scar tissue mobilized.    Person(s) Educated Patient    Methods Explanation;Demonstration;Tactile cues;Verbal cues;Handout    Comprehension Verbalized understanding              PT Short Term Goals - 09/26/21 1600       PT SHORT TERM GOAL #1   Title Pt will be independent with HEP.    Time 4    Period Weeks  Status New    Target Date 10/24/21      PT SHORT TERM GOAL #2   Title Pt will be independent with pressure management during exercise and functional activity in order to reduce strain to abdominal scar tissue/    Time 4    Period Weeks    Status New    Target Date 10/24/21               PT Long Term Goals - 09/26/21 1601       PT LONG TERM GOAL #1   Title Pt will be independent with advanced HEP.    Time 12    Period Weeks    Status New    Target Date 12/19/21      PT LONG TERM GOAL #2   Title Pt will perform 2/3 on sit-up test with minimal abdominal bulging and appropriate pressure management in order to demonstrate improved core strength.    Time 12    Period Weeks    Status New    Target Date 12/19/21      PT LONG TERM GOAL #3   Title Pt will increase all lumbar A/ROM by 50% and hip extension strength by 1 grade in order to improve lumbopelvic  mobility and support, reducing abdominal strain.    Time 12    Period Weeks    Status New    Target Date 12/19/21      PT LONG TERM GOAL #4   Title Pt will be independent with urge suppression technique and report <2 episodes of bowel urgency a week.    Time 12    Period Weeks    Status New    Target Date 12/19/21                   Plan - 10/04/21 1215     Clinical Impression Statement Manual scar tissue mobilization and abdominal myofascial release performed; good improvements in lower scar tissue restriction, but where ileostomy was is still very restricted. Pt was able to perform trasnversus abdominus contraction with VC/TCs and incorporate into supine progressions. HEP updated. He will continue to benefit from skilled PT intervention in order to progress towards goal completion.    PT Treatment/Interventions ADLs/Self Care Home Management;Biofeedback;Cryotherapy;Electrical Stimulation;Moist Heat;Therapeutic activities;Therapeutic exercise;Neuromuscular re-education;Manual techniques;Patient/family education;Scar mobilization;Passive range of motion;Dry needling;Spinal Manipulations    PT Next Visit Plan Continue manual techniques to abdominal scar tissue; progress core and hip strengthening to tolerance.    PT Home Exercise Plan [email or web address]    Consulted and Agree with Plan of Care Patient             Patient will benefit from skilled therapeutic intervention in order to improve the following deficits and impairments:  Decreased coordination, Decreased range of motion, Increased fascial restricitons, Impaired tone, Decreased endurance, Increased muscle spasms, Pain, Decreased activity tolerance, Decreased scar mobility, Hypomobility, Impaired flexibility, Postural dysfunction, Decreased mobility, Decreased strength  Visit Diagnosis: Muscle weakness (generalized)  Unspecified lack of coordination     Problem List Patient Active Problem List   Diagnosis Date Noted    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

## 2021-10-04 NOTE — Patient Instructions (Signed)
Access Code: [email or web address] URL: [email or web address] Date: 10/04/2021 Prepared by: Heather Roberts  Exercises Supine Transversus Abdominis Bracing - Hands on Stomach - 1 x daily - 7 x weekly - 1 sets - 10 reps Supine March - 1 x daily - 7 x weekly - 2 sets - 10 reps Supine Transversus Abdominis Bracing with Leg Extension - 1 x daily - 7 x weekly - 2 sets - 10 reps Supine Shoulder Horizontal Abduction with Resistance - 1 x daily - 7 x weekly - 2 sets - 10 reps

## 2021-10-10 ENCOUNTER — Ambulatory Visit: Payer: BC Managed Care – PPO

## 2021-10-10 ENCOUNTER — Other Ambulatory Visit: Payer: Self-pay

## 2021-10-10 DIAGNOSIS — M6281 Muscle weakness (generalized): Secondary | ICD-10-CM | POA: Diagnosis not present

## 2021-10-10 DIAGNOSIS — R279 Unspecified lack of coordination: Secondary | ICD-10-CM

## 2021-10-10 NOTE — Therapy (Signed)
[facility] [place] @ [place], [place], [address] Phone: [phone]   Fax:  [phone]  Physical Therapy Treatment  Patient Details  Name: Cesar Herrera MRN: [id number] Date of Birth: [birth year] Referring Provider (PT): [name], MD   Encounter Date: 10/10/2021   PT End of Session - 10/10/21 1449     Visit Number 3    Date for PT Re-Evaluation 12/19/21    Authorization Type BCBS    PT Start Time 1446    PT Stop Time 1528    PT Time Calculation (min) 42 min    Activity Tolerance Patient tolerated treatment well    Behavior During Therapy [facility] for tasks assessed/performed             Past Medical History:  Diagnosis Date   Cancer ([place])    Family history of bladder cancer    Family history of lymphoma    H/O epididymitis 2011   Seasonal allergies     Past Surgical History:  Procedure Laterality Date   COLONOSCOPY     ILEOSTOMY CLOSURE N/A 08/03/2021   Procedure: ILEOSTOMY CLOSURE;  Surgeon: Leighton Ruff, MD;  Location: [facility];  Service: General;  Laterality: N/A;   LAPAROSCOPY N/A 08/03/2021   Procedure: LAPAROSCOPY DIAGNOSTIC;  Surgeon: Leighton Ruff, MD;  Location: [facility];  Service: General;  Laterality: N/A;   VASECTOMY     WISDOM TOOTH EXTRACTION     XI ROBOTIC ASSISTED LOWER ANTERIOR RESECTION N/A 03/10/2021   Procedure: XI ROBOTIC ASSISTED LOWER ANTERIOR RESECTION WITH LOOP ILEOSTOMY;  Surgeon: Leighton Ruff, MD;  Location: [facility];  Service: General;  Laterality: N/A;    There were no vitals filed for this visit.   Subjective Assessment - 10/10/21 1447     Subjective Pt states that he feels like he continues to improve, but he did have one day last week in which he noticed more urgency. He has gotten to HEP once since his last visit. He did have mild soreness after last treatment session.    Pertinent History Colorectal cancer 11/02/20; bowel/resection and ileostomy 03/10/21; ileostomy reversal  08/03/2021; chemo/RT 12/05/20-01/11/21.    Patient Stated Goals Decrease urgency, increase pelvic floor/core strength    Currently in Pain? No/denies    Multiple Pain Sites No                               [facility] Adult PT Treatment/Exercise - 10/10/21 0001       Neuro Re-ed    Neuro Re-ed Details  Trasnversus abdominus training with VC/TCs and breath coordination; leg extensions 2 x 10      Exercises   Exercises Lumbar      Lumbar Exercises: Stretches   Other Lumbar Stretch Exercise Hip flexor stretch 60 sec bil      Lumbar Exercises: Supine   Dead Bug 20 reps      Lumbar Exercises: Sidelying   Other Sidelying Lumbar Exercises open books 10x Bil      Lumbar Exercises: Quadruped   Madcat/Old Horse 20 reps    Opposite Arm/Leg Raise 20 reps;Right arm/Left leg;Left arm/Right leg      Manual Therapy   Manual Therapy Myofascial release    Myofascial Release Abdominal scar tissue mobilization; abdominal myofascial release                     PT Education - 10/10/21 1450  Education Details Pt education performed on importance of continuing HEP regularly. Encouraged to pay attention to increased abdominal pressure/bulging. Discussed urge suppression technique.    Person(s) Educated Patient    Methods Explanation;Demonstration;Tactile cues;Verbal cues;Handout    Comprehension Verbalized understanding              PT Short Term Goals - 09/26/21 1600       PT SHORT TERM GOAL #1   Title Pt will be independent with HEP.    Time 4    Period Weeks    Status New    Target Date 10/24/21      PT SHORT TERM GOAL #2   Title Pt will be independent with pressure management during exercise and functional activity in order to reduce strain to abdominal scar tissue/    Time 4    Period Weeks    Status New    Target Date 10/24/21               PT Long Term Goals - 09/26/21 1601       PT LONG TERM GOAL #1   Title Pt will be independent with  advanced HEP.    Time 12    Period Weeks    Status New    Target Date 12/19/21      PT LONG TERM GOAL #2   Title Pt will perform 2/3 on sit-up test with minimal abdominal bulging and appropriate pressure management in order to demonstrate improved core strength.    Time 12    Period Weeks    Status New    Target Date 12/19/21      PT LONG TERM GOAL #3   Title Pt will increase all lumbar A/ROM by 50% and hip extension strength by 1 grade in order to improve lumbopelvic mobility and support, reducing abdominal strain.    Time 12    Period Weeks    Status New    Target Date 12/19/21      PT LONG TERM GOAL #4   Title Pt will be independent with urge suppression technique and report <2 episodes of bowel urgency a week.    Time 12    Period Weeks    Status New    Target Date 12/19/21                   Plan - 10/10/21 1514     Clinical Impression Statement Pt is overall doing well. He was able to teach back urge suppression teachnique and believ ethis will be beneficial in reducing some of the urgency and discomfort with bowel movement. Good tolerance to all core progressions, but he did require VC/TCs to help decrease abnormal abdominal pressure increase and utilize abdominal muscles appropriately instead. COntinued improvement in scar tissue restriction palpated during manual techniques. He will continue to benefit from skilled PT intervention in order to progress functional strengthening and work towards goal completion.    PT Treatment/Interventions ADLs/Self Care Home Management;Biofeedback;Cryotherapy;Electrical Stimulation;Moist Heat;Therapeutic activities;Therapeutic exercise;Neuromuscular re-education;Manual techniques;Patient/family education;Scar mobilization;Passive range of motion;Dry needling;Spinal Manipulations    PT Next Visit Plan Continue manual techniques to abdominal scar tissue; progress core and hip strengthening to tolerance.    PT Home Exercise Plan  [email or web address]    Consulted and Agree with Plan of Care Patient             Patient will benefit from skilled therapeutic intervention in order to improve the following deficits and impairments:  Decreased coordination, Decreased range of  motion, Increased fascial restricitons, Impaired tone, Decreased endurance, Increased muscle spasms, Pain, Decreased activity tolerance, Decreased scar mobility, Hypomobility, Impaired flexibility, Postural dysfunction, Decreased mobility, Decreased strength  Visit Diagnosis: Muscle weakness (generalized)  Unspecified lack of coordination     Problem List Patient Active Problem List   Diagnosis Date Noted   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

## 2021-10-10 NOTE — Patient Instructions (Addendum)
Urge suppression technique: A technique to help you hold urine until its an appropriate time to go, whether this is making it home or trying to reach a specific voiding time frame according to your schedule. It helps to send signals from the bladder to the brain that say you dont actually have to void urine right now. This most likely only give you several minutes of relief at first, but repeat as needed; the benefit will last longer as you use this technique more and get into better bladder habits. ?  The technique: o Perform 5 quick flicks (Kegels) rapidly, not worrying about fully relaxing in between each (only in this technique). o Then perform several deep belly breaths while focusing on relaxing the pelvic floor. o Go do something else to help distract yourself from the urge to urinate. o Repeat as needed.  Access Code: [email or web address] URL: [email or web address] Date: 10/10/2021 Prepared by: Heather Roberts  Exercises Supine Transversus Abdominis Bracing - Hands on Stomach - 1 x daily - 7 x weekly - 1 sets - 10 reps Supine March - 1 x daily - 7 x weekly - 2 sets - 10 reps Supine Transversus Abdominis Bracing with Leg Extension - 1 x daily - 7 x weekly - 2 sets - 10 reps Supine Shoulder Horizontal Abduction with Resistance - 1 x daily - 7 x weekly - 2 sets - 10 reps Cat Cow - 1 x daily - 7 x weekly - 2 sets - 10 reps Half Kneeling Hip Flexor Stretch - 1 x daily - 7 x weekly - 1 sets - 2 reps - 30-60 hold Supine Dead Bug with Leg Extension - 1 x daily - 7 x weekly - 2 sets - 10 reps Bird Dog - 1 x daily - 7 x weekly - 1 sets - 10 reps Sidelying Open Book Thoracic Lumbar Rotation and Extension - 1 x daily - 7 x weekly - 1 sets - 10 reps

## 2021-10-16 ENCOUNTER — Inpatient Hospital Stay: Payer: BC Managed Care – PPO | Attending: Nurse Practitioner

## 2021-10-16 ENCOUNTER — Ambulatory Visit (HOSPITAL_COMMUNITY)
Admission: RE | Admit: 2021-10-16 | Discharge: 2021-10-16 | Disposition: A | Discharge status: Home / Self Care | Payer: BC Managed Care – PPO | Source: Ambulatory Visit | Attending: Hematology | Admitting: Hematology

## 2021-10-16 ENCOUNTER — Encounter (HOSPITAL_COMMUNITY): Payer: Self-pay

## 2021-10-16 ENCOUNTER — Other Ambulatory Visit: Payer: Self-pay

## 2021-10-16 DIAGNOSIS — C20 Malignant neoplasm of rectum: Secondary | ICD-10-CM | POA: Insufficient documentation

## 2021-10-16 DIAGNOSIS — D1803 Hemangioma of intra-abdominal structures: Secondary | ICD-10-CM | POA: Diagnosis not present

## 2021-10-16 DIAGNOSIS — Z9221 Personal history of antineoplastic chemotherapy: Secondary | ICD-10-CM | POA: Insufficient documentation

## 2021-10-16 DIAGNOSIS — Z79899 Other long term (current) drug therapy: Secondary | ICD-10-CM | POA: Insufficient documentation

## 2021-10-16 DIAGNOSIS — K439 Ventral hernia without obstruction or gangrene: Secondary | ICD-10-CM | POA: Diagnosis not present

## 2021-10-16 DIAGNOSIS — Z432 Encounter for attention to ileostomy: Secondary | ICD-10-CM | POA: Diagnosis not present

## 2021-10-16 DIAGNOSIS — Z85048 Personal history of other malignant neoplasm of rectum, rectosigmoid junction, and anus: Secondary | ICD-10-CM | POA: Diagnosis not present

## 2021-10-16 DIAGNOSIS — Z923 Personal history of irradiation: Secondary | ICD-10-CM | POA: Insufficient documentation

## 2021-10-16 LAB — IRON AND IRON BINDING CAPACITY (CC-WL,HP ONLY)
Iron: 92 ug/dL (ref 45–182)
Saturation Ratios: 21 % (ref 17.9–39.5)
TIBC: 442 ug/dL (ref 250–450)
UIBC: 350 ug/dL (ref 117–376)

## 2021-10-16 LAB — CBC WITH DIFFERENTIAL (CANCER CENTER ONLY)
Abs Immature Granulocytes: 0.01 10*3/uL (ref 0.00–0.07)
Basophils Absolute: 0 10*3/uL (ref 0.0–0.1)
Basophils Relative: 1 %
Eosinophils Absolute: 0 10*3/uL (ref 0.0–0.5)
Eosinophils Relative: 1 %
HCT: 42.7 % (ref 39.0–52.0)
Hemoglobin: 14.8 g/dL (ref 13.0–17.0)
Immature Granulocytes: 0 %
Lymphocytes Relative: 8 %
Lymphs Abs: 0.5 10*3/uL — ABNORMAL LOW (ref 0.7–4.0)
MCH: 30.3 pg (ref 26.0–34.0)
MCHC: 34.7 g/dL (ref 30.0–36.0)
MCV: 87.5 fL (ref 80.0–100.0)
Monocytes Absolute: 0.4 10*3/uL (ref 0.1–1.0)
Monocytes Relative: 6 %
Neutro Abs: 5.1 10*3/uL (ref 1.7–7.7)
Neutrophils Relative %: 84 %
Platelet Count: 220 10*3/uL (ref 150–400)
RBC: 4.88 MIL/uL (ref 4.22–5.81)
RDW: 11.7 % (ref 11.5–15.5)
WBC Count: 6 10*3/uL (ref 4.0–10.5)
nRBC: 0 % (ref 0.0–0.2)

## 2021-10-16 LAB — CMP (CANCER CENTER ONLY)
ALT: 23 U/L (ref 0–44)
AST: 17 U/L (ref 15–41)
Albumin: 4.5 g/dL (ref 3.5–5.0)
Alkaline Phosphatase: 82 U/L (ref 38–126)
Anion gap: 7 (ref 5–15)
BUN: 13 mg/dL (ref 6–20)
CO2: 29 mmol/L (ref 22–32)
Calcium: 9.6 mg/dL (ref 8.9–10.3)
Chloride: 102 mmol/L (ref 98–111)
Creatinine: 1.13 mg/dL (ref 0.61–1.24)
GFR, Estimated: >60 mL/min (ref >60)
Glucose, Bld: 102 mg/dL — ABNORMAL HIGH (ref 70–99)
Potassium: 4.1 mmol/L (ref 3.5–5.1)
Sodium: 138 mmol/L (ref 135–145)
Total Bilirubin: 0.4 mg/dL (ref 0.3–1.2)
Total Protein: 7.3 g/dL (ref 6.5–8.1)

## 2021-10-16 LAB — FERRITIN: Ferritin: 102 ng/mL (ref 24–336)

## 2021-10-16 IMAGING — CT CT ABD-PELV W/ CM
2 of 5 series · 15 of 46 positions shown, 17 images · IV contrast (OMNIPAQUE)
Comparison: Multiple priors including most recent CT [DATE]

CLINICAL DATA: History of rectal cancer status post resection with
ostomy reversal, radiation and chemotherapy

EXAM:
CT ABDOMEN AND PELVIS WITH CONTRAST
TECHNIQUE: Multidetector CT imaging of the abdomen and pelvis was performed
using the standard protocol following bolus administration of
intravenous contrast.

[Series 2: axial st · axial · 0.79mm/px · z∈[-458,-34]mm · 12 of 99 slices shown, 14 images]
[im 7/99  soft-tissue]
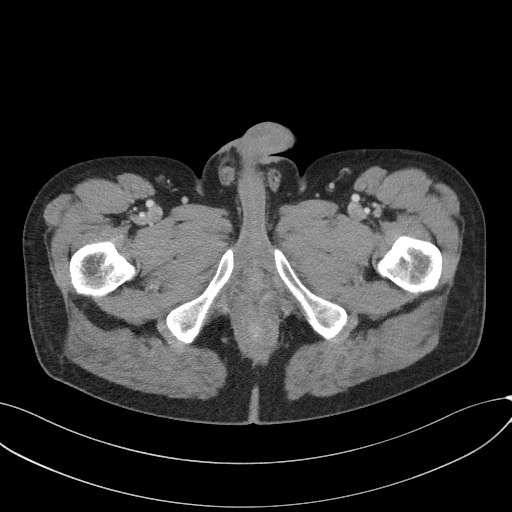
[im 7/99  bone]
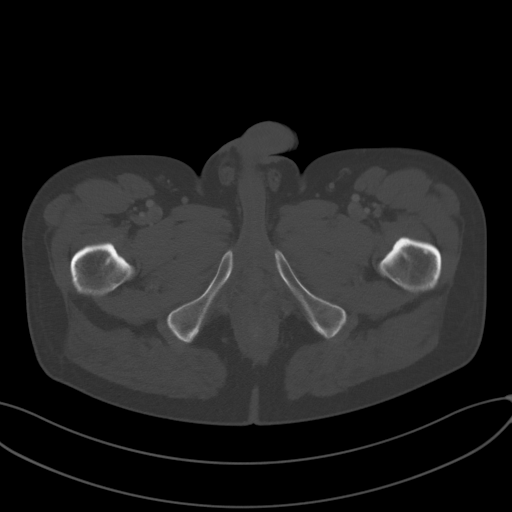
[im 14/99  soft-tissue]
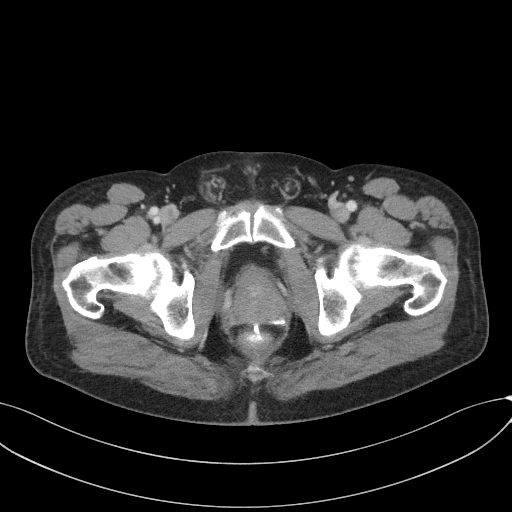
[im 20/99  soft-tissue]
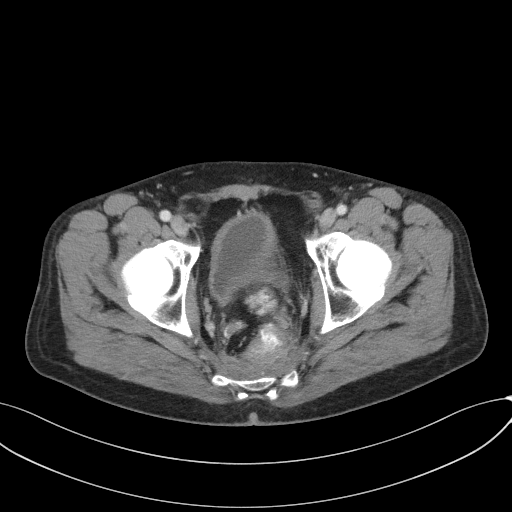
[im 33/99  soft-tissue]
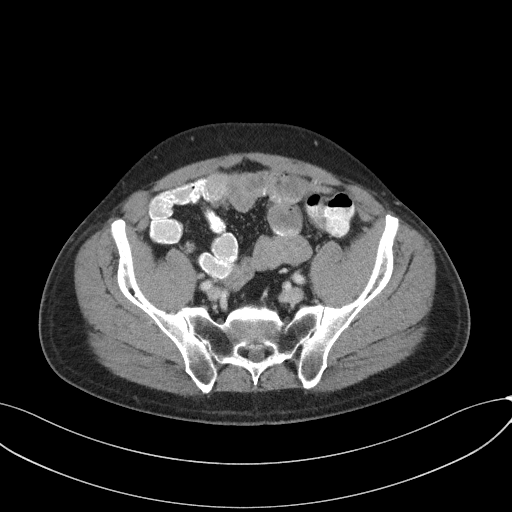
[im 40/99  soft-tissue]
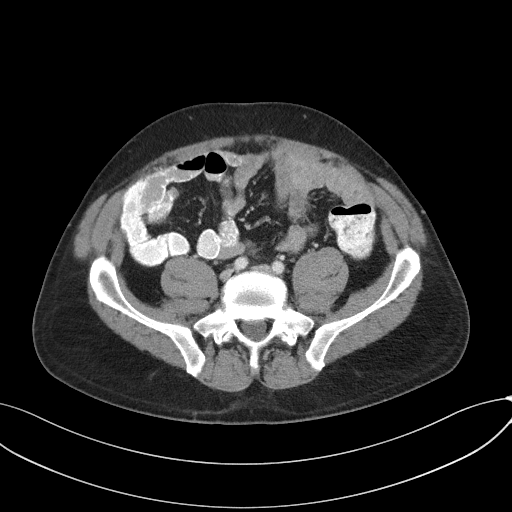
[im 46/99  soft-tissue]
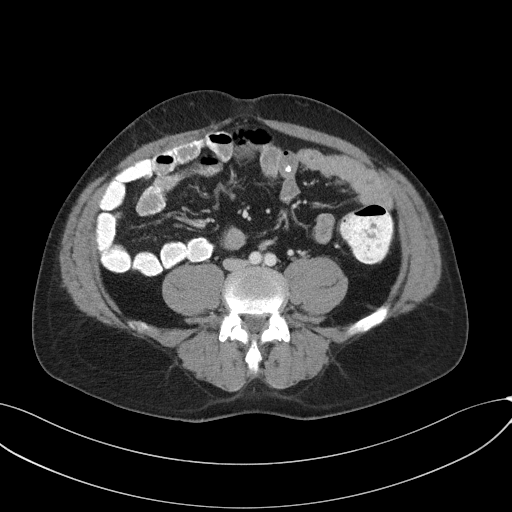
[im 53/99  soft-tissue]
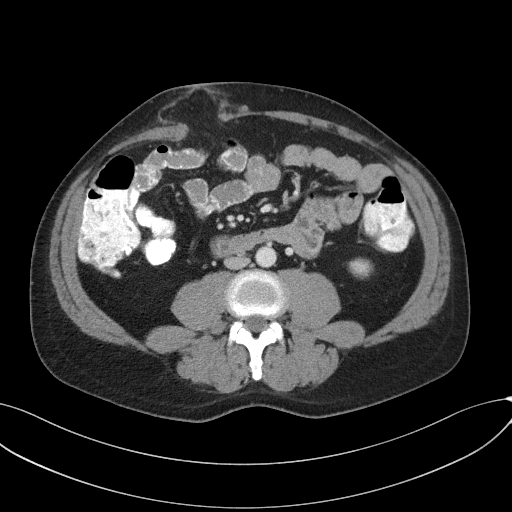
[im 59/99  soft-tissue]
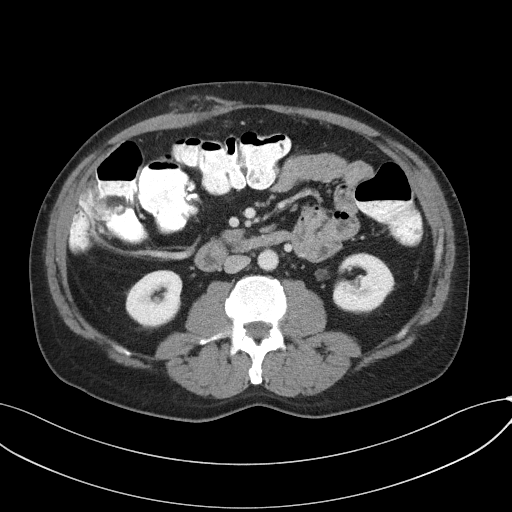
[im 66/99  soft-tissue]
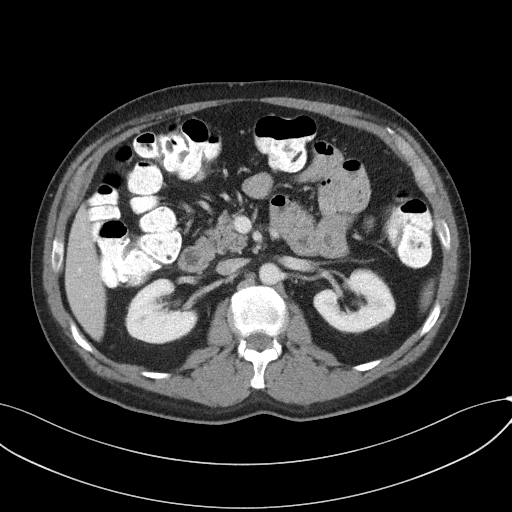
[im 66/99  bone]
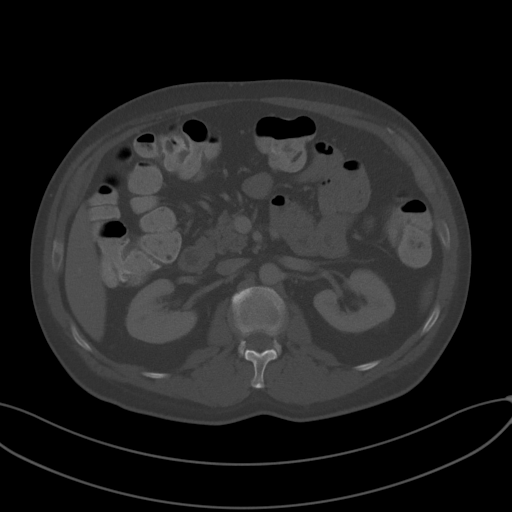
[im 79/99  soft-tissue]
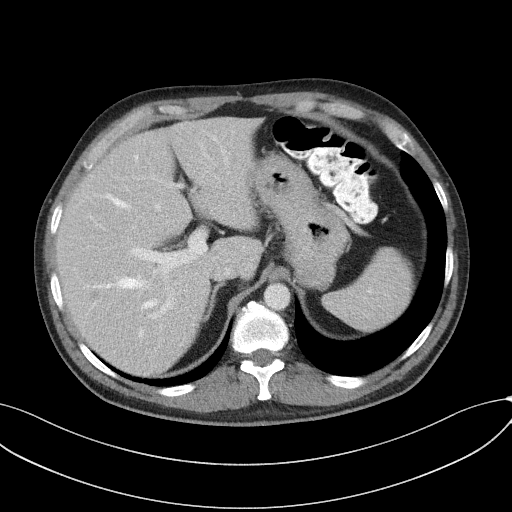
[im 85/99  soft-tissue]
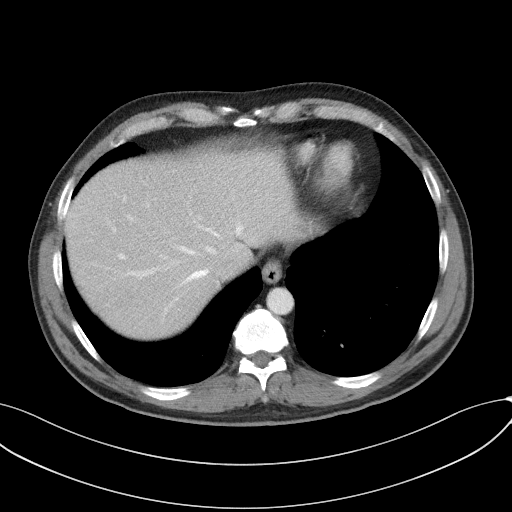
[im 92/99  soft-tissue]
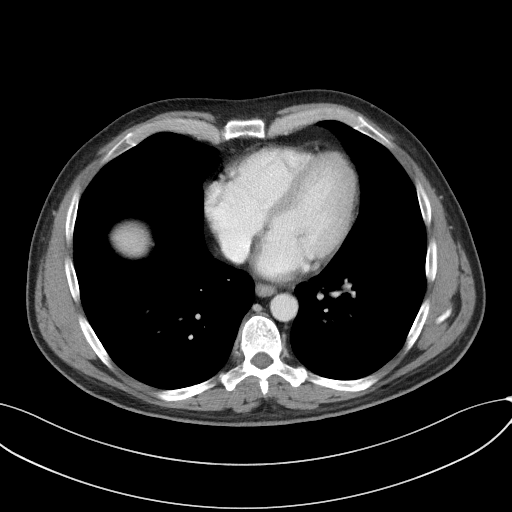

[Series 4: coronal st · coronal · 0.84mm/px · 3 of 89 slices shown]
[im 30/89  soft-tissue]
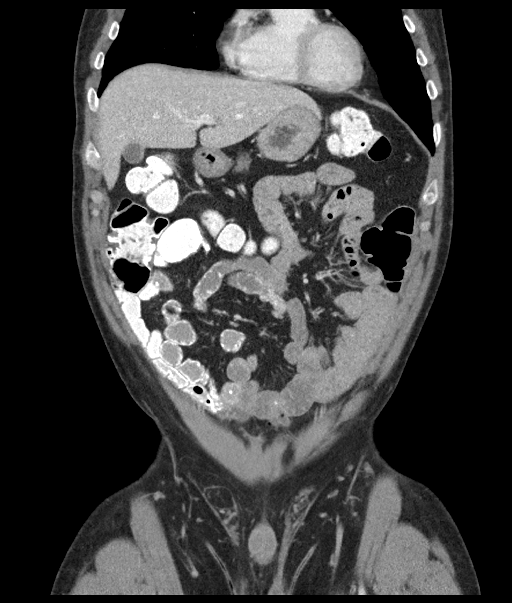
[im 40/89  soft-tissue]
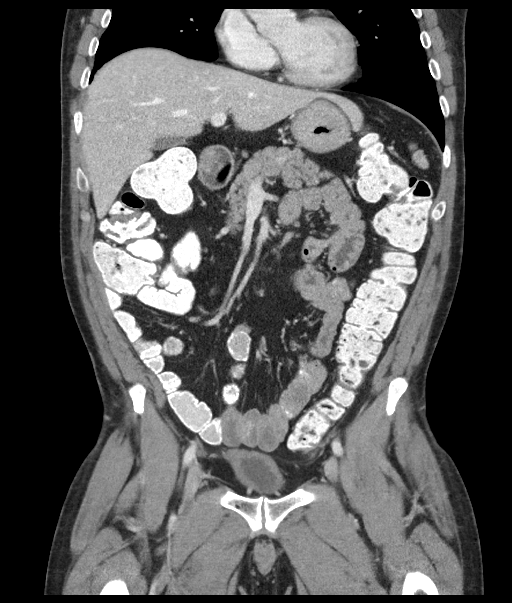
[im 49/89  soft-tissue]
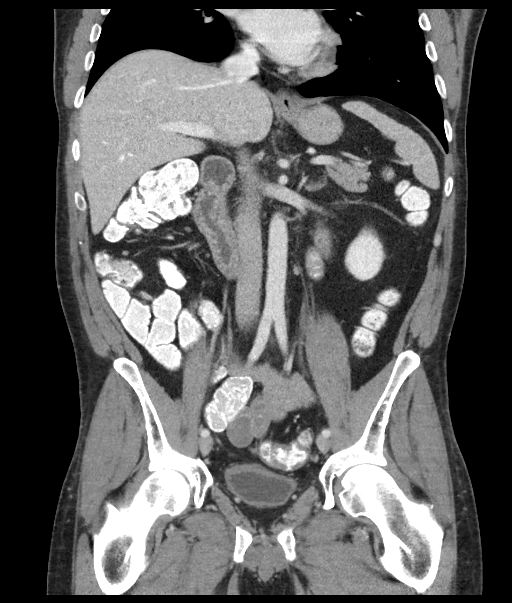

[15 of 46 positions shown; findings below may reference images not displayed]

RADIATION DOSE REDUCTION: This exam was performed according to the
departmental dose-optimization program which includes automated
exposure control, adjustment of the mA and/or kV according to
patient size and/or use of iterative reconstruction technique.

CONTRAST:  100mL OMNIPAQUE IOHEXOL 300 MG/ML  SOLN
FINDINGS: Lower chest: Stable 4 mm right middle lobe pulmonary nodule on image
[DATE]. Stable 2 mm anterior subpleural pulmonary nodule on image [DATE].
No new suspicious pulmonary nodules or masses.

Hepatobiliary: Stable hypodense 13 mm lesion in the right lobe of
the liver on image [DATE], previously characterized as a benign
hepatic hemangioma on MRI [DATE]. No new suspicious hepatic
lesions. Gallbladder is unremarkable. No biliary ductal dilation.

Pancreas: No pancreatic ductal dilation or evidence of acute
inflammation.

Spleen: Normal in size without focal abnormality.

Adrenals/Urinary Tract: Bilateral adrenal glands appear normal. No
hydronephrosis. Kidneys demonstrate symmetric enhancement excretion
of contrast material. No suspicious filling defect visualized within
the opacified portions of the collecting systems or proximal ureters
on delayed imaging. No solid enhancing renal mass. Mild wall
thickening of a nondistended urinary bladder.

Stomach/Bowel: Radiopaque enteric contrast material traverses the
rectum. Postsurgical change of low anterior resection with interval
loop ileostomy takedown. Similar presacral soft tissue which again
measures up to 12 mm in maximum axial thickness on image 81/2. The
distal sigmoid colon and rectum are minimally distended with orally
ingested contrast material without focal abnormal wall thickening.

Stomach is unremarkable for degree of distension. No pathologic
dilation of small or large bowel. The appendix and terminal ileum
appear normal. No evidence of acute bowel inflammation.

Vascular/Lymphatic: Normal caliber abdominal aorta. No
pathologically enlarged abdominal or pelvic lymph nodes.

Reproductive: Prostate is unremarkable.

Other: No significant abdominopelvic free fluid. Fat containing
right anterior abdominal wall stomal hernia. No discrete peritoneal
or omental nodularity.

Musculoskeletal: L2 vertebral body hemangioma. No aggressive lytic
or blastic lesion of bone. Degenerative changes bilateral hips.
IMPRESSION: 1. Postsurgical change of low anterior resection with interval loop
ileostomy takedown and similar, likely post treatment/surgical
related, presacral soft tissue thickening. No evidence of new or
progressive disease in the chest abdomen or pelvis to suggest
recurrence or metastatic disease.
2. Stable tiny right middle lobe pulmonary nodules measuring up to 4
mm, favored benign. Continued attention on follow-up imaging
suggested.
3. Stable benign 1.3 cm hepatic hemangioma.
4. Fat containing right anterior abdominal wall stomal hernia.

## 2021-10-16 MED ORDER — IOHEXOL 300 MG/ML  SOLN
100.0000 mL | Freq: Once | INTRAMUSCULAR | Status: AC | PRN
  Administered 2021-10-16: 100 mL via INTRAVENOUS

## 2021-10-16 MED ORDER — SODIUM CHLORIDE (PF) 0.9 % IJ SOLN
INTRAMUSCULAR | Status: AC
  Filled 2021-10-16: qty 50

## 2021-10-17 ENCOUNTER — Ambulatory Visit: Payer: BC Managed Care – PPO

## 2021-10-17 DIAGNOSIS — R279 Unspecified lack of coordination: Secondary | ICD-10-CM

## 2021-10-17 DIAGNOSIS — M6281 Muscle weakness (generalized): Secondary | ICD-10-CM

## 2021-10-17 NOTE — Patient Instructions (Signed)
Access Code: [email or web address] URL: [email or web address] Date: 10/17/2021 Prepared by: Heather Roberts  Exercises Supine Transversus Abdominis Bracing - Hands on Stomach - 1 x daily - 7 x weekly - 1 sets - 10 reps Supine March - 1 x daily - 7 x weekly - 2 sets - 10 reps Supine Transversus Abdominis Bracing with Leg Extension - 1 x daily - 7 x weekly - 2 sets - 10 reps Cat Cow - 1 x daily - 7 x weekly - 2 sets - 10 reps Half Kneeling Hip Flexor Stretch - 1 x daily - 7 x weekly - 1 sets - 2 reps - 30-60 hold Supine Dead Bug with Leg Extension - 1 x daily - 7 x weekly - 2 sets - 10 reps Bird Dog - 1 x daily - 7 x weekly - 1 sets - 10 reps Sidelying Open Book Thoracic Lumbar Rotation and Extension - 1 x daily - 7 x weekly - 1 sets - 10 reps Standing Shoulder Row with Anchored Resistance - 1 x daily - 7 x weekly - 2 sets - 10 reps Shoulder extension with resistance - Neutral - 1 x daily - 7 x weekly - 2 sets - 10 reps Standing Anti-Rotation Press with Anchored Resistance - 1 x daily - 7 x weekly - 2 sets - 10 reps Squat with Chair Touch - 1 x daily - 7 x weekly - 2 sets - 10 reps

## 2021-10-17 NOTE — Therapy (Signed)
[place] @ [place], [place], [address] Phone: [phone]   Fax:  [phone]  Physical Therapy Treatment  Patient Details  Name: Cesar Herrera MRN: [id number] Date of Birth: 14-May-1971 Referring Provider (PT): [name], MD   Encounter Date: 10/17/2021   PT End of Session - 10/17/21 1447     Visit Number 4    Date for PT Re-Evaluation 12/19/21    Authorization Type BCBS    PT Start Time 1446    PT Stop Time 1528    PT Time Calculation (min) 42 min    Activity Tolerance Patient tolerated treatment well    Behavior During Therapy [facility] for tasks assessed/performed             Past Medical History:  Diagnosis Date   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   Family history of bladder cancer    Family history of lymphoma    H/O epididymitis 2011   Seasonal allergies     Past Surgical History:  Procedure Laterality Date   COLONOSCOPY     ILEOSTOMY CLOSURE N/A 08/03/2021   Procedure: ILEOSTOMY CLOSURE;  Surgeon: Leighton Ruff, MD;  Location: [facility];  Service: General;  Laterality: N/A;   LAPAROSCOPY N/A 08/03/2021   Procedure: LAPAROSCOPY DIAGNOSTIC;  Surgeon: Leighton Ruff, MD;  Location: [facility];  Service: General;  Laterality: N/A;   VASECTOMY     WISDOM TOOTH EXTRACTION     XI ROBOTIC ASSISTED LOWER ANTERIOR RESECTION N/A 03/10/2021   Procedure: XI ROBOTIC ASSISTED LOWER ANTERIOR RESECTION WITH LOOP ILEOSTOMY;  Surgeon: Leighton Ruff, MD;  Location: [facility];  Service: General;  Laterality: N/A;    There were no vitals filed for this visit.   Subjective Assessment - 10/17/21 1447     Subjective Pt has been to see MD and reports that she feels like it is not a hernia; he states that MD believes PT will continue to benefit and that in 4-6 months it will improve. He has been working on exercises and feels like they are more diffiuclt and beneficial.    Patient Stated Goals Decrease urgency, increase pelvic floor/core  strength    Currently in Pain? No/denies    Multiple Pain Sites No                               [facility] Adult PT Treatment/Exercise - 10/17/21 0001       Lumbar Exercises: Standing   Functional Squats 20 reps    Row Strengthening;Both;20 reps    Theraband Level (Row) Level 3 (Green)    Shoulder Extension Strengthening;Both;20 reps    Theraband Level (Shoulder Extension) Level 3 (Green)    Other Standing Lumbar Exercises Pallof press 2 x 10 Bil, green band    Other Standing Lumbar Exercises Standing march on airex      Manual Therapy   Manual Therapy Myofascial release    Myofascial Release Abdominal scar tissue mobilization; abdominal myofascial release with suction                     PT Education - 10/17/21 1504     Education Details Pt educatio nperformed on new exercise progressions.    Person(s) Educated Patient    Methods Explanation;Demonstration;Tactile cues;Verbal cues;Handout    Comprehension Verbalized understanding              PT Short Term Goals - 09/26/21  1600       PT SHORT TERM GOAL #1   Title Pt will be independent with HEP.    Time 4    Period Weeks    Status New    Target Date 10/24/21      PT SHORT TERM GOAL #2   Title Pt will be independent with pressure management during exercise and functional activity in order to reduce strain to abdominal scar tissue/    Time 4    Period Weeks    Status New    Target Date 10/24/21               PT Long Term Goals - 09/26/21 1601       PT LONG TERM GOAL #1   Title Pt will be independent with advanced HEP.    Time 12    Period Weeks    Status New    Target Date 12/19/21      PT LONG TERM GOAL #2   Title Pt will perform 2/3 on sit-up test with minimal abdominal bulging and appropriate pressure management in order to demonstrate improved core strength.    Time 12    Period Weeks    Status New    Target Date 12/19/21      PT LONG TERM GOAL #3   Title Pt  will increase all lumbar A/ROM by 50% and hip extension strength by 1 grade in order to improve lumbopelvic mobility and support, reducing abdominal strain.    Time 12    Period Weeks    Status New    Target Date 12/19/21      PT LONG TERM GOAL #4   Title Pt will be independent with urge suppression technique and report <2 episodes of bowel urgency a week.    Time 12    Period Weeks    Status New    Target Date 12/19/21                   Plan - 10/17/21 1505     Clinical Impression Statement Pt continues to make progress with only one episode of burning urgency in the last week; he reports the the urge sppression technique is helpful. Improving scar tissue restriction palpated today with less tenderness; suction utilized over most restricted areas with good tolerance and furhter improvements in mobility. Excellent tolerance to exercise progressions including rows, extensions, pallof, standing march, and squats with appropriate form and challenge indicating good improvements in musclar strength and endurance; little adjustments with multimodal cues to help improve abdominal pressure management. He will continue to benefit from skilled PT intervention in order to progress functional strengthening to tolerance.    PT Treatment/Interventions ADLs/Self Care Home Management;Biofeedback;Cryotherapy;Electrical Stimulation;Moist Heat;Therapeutic activities;Therapeutic exercise;Neuromuscular re-education;Manual techniques;Patient/family education;Scar mobilization;Passive range of motion;Dry needling;Spinal Manipulations    PT Next Visit Plan Continue manual techniques to abdominal scar tissue; progress core and hip strengthening to tolerance.    PT Home Exercise Plan [email or web address]    Consulted and Agree with Plan of Care Patient             Patient will benefit from skilled therapeutic intervention in order to improve the following deficits and impairments:  Decreased coordination, Decreased  range of motion, Increased fascial restricitons, Impaired tone, Decreased endurance, Increased muscle spasms, Pain, Decreased activity tolerance, Decreased scar mobility, Hypomobility, Impaired flexibility, Postural dysfunction, Decreased mobility, Decreased strength  Visit Diagnosis: Muscle weakness (generalized)  Unspecified lack of coordination     Problem List  Patient Active Problem List   Diagnosis Date Noted   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

## 2021-10-19 ENCOUNTER — Encounter: Payer: Self-pay | Admitting: Hematology

## 2021-10-19 ENCOUNTER — Inpatient Hospital Stay (HOSPITAL_BASED_OUTPATIENT_CLINIC_OR_DEPARTMENT_OTHER): Payer: BC Managed Care – PPO | Admitting: Hematology

## 2021-10-19 ENCOUNTER — Other Ambulatory Visit: Payer: Self-pay

## 2021-10-19 VITALS — BP 118/71 | HR 81 | Temp 98.6°F | Resp 17 | Ht 68.0 in | Wt 174.3 lb

## 2021-10-19 DIAGNOSIS — Z9221 Personal history of antineoplastic chemotherapy: Secondary | ICD-10-CM | POA: Diagnosis not present

## 2021-10-19 DIAGNOSIS — Z79899 Other long term (current) drug therapy: Secondary | ICD-10-CM | POA: Diagnosis not present

## 2021-10-19 DIAGNOSIS — C20 Malignant neoplasm of rectum: Secondary | ICD-10-CM | POA: Diagnosis not present

## 2021-10-19 DIAGNOSIS — Z923 Personal history of irradiation: Secondary | ICD-10-CM | POA: Diagnosis not present

## 2021-10-19 NOTE — Progress Notes (Signed)
Cesar Herrera   [phone] Fax:([phone]   Clinic Follow up Note   Patient Care Team: [name], MD as PCP - General (Internal Medicine) [name], NP as Nurse Practitioner (Nurse Practitioner) [name], MD as Consulting Physician (Oncology)  Date of Service:  10/19/2021  CHIEF COMPLAINT: f/u of rectal cancer  CURRENT THERAPY:  Surveillance  ASSESSMENT & PLAN:  Cesar Herrera is a 51 y.o. male with   1. Adenocarcinoma of the rectum, cT3N0/N1, stage II or III,ypT1N0 -He was diagnosed in 11/2020 by colonoscopy which showed upper/mid rectal adenocarcinoma. -Staging work-up shows no evidence of distant metastatic disease (and 1.4 cm benign hemangioma in right lobe of liver) -Local staging pelvic MRI shows T3N1 disease, however the node is a single 5 mm mesorectal lymph node and could be reactive, could be N0. I spoke with radiology about this -He completed standard neoadjuvant concurrent chemo/RT with Xeloda 12/05/20-01/11/21.  -He proceeded to resection on 03/10/21 under Dr. [name]. Pathology showed 2 mm residual tumor, all negative lymph nodes (0/13). Loop ileostomy performed at that time.  -He began adjuvant Xeloda on 04/03/21. He tolerated well with mild fatigue. He completed Xeloda on 07/09/21 -he had ileostomy reversal on 08/03/21, benign pathology.  -repeat CT AP 10/16/21 showed postsurgical change, no evidence of new or progressive disease. I reviewed the images and discussed the results with them today. -he will be due for surveillance colonoscopy in 03/2022 with Dr. Ardis Hughs. I will send them a message. -he is clinically doing well, improving well from surgery. He continues with physical therapy. Labs from 2/13 reviewed, overall WNL.   2. Genetics -His genetic testing from 12/23/20 was negative for pathogenetic mutations. 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 Ardis Hughs and Dr. [name] regarding surveillance -lab and  f/u in 3 months   No problem-specific Assessment & Plan notes found for this encounter.   SUMMARY OF ONCOLOGIC HISTORY: Oncology History Overview Note   Cancer Staging  Rectal adenocarcinoma [facility]) Staging form: Colon and Rectum, AJCC 8th Edition - Clinical stage from 11/23/2020: Stage IIIB (cT3, cN1, cM0) - Signed by [name], NP on 11/23/2020 Stage prefix: Initial diagnosis - Pathologic stage from 03/10/2021: Stage I (ypT1, pN0, cM0) - Signed by [name], MD on 05/31/2021 Stage prefix: Post-therapy Response to neoadjuvant therapy: Partial response Total positive nodes: 0 Histologic grading system: 4 grade system Histologic grade (G): G2 Residual tumor (R): R0 - None    Rectal adenocarcinoma ([place])  11/11/2020 Procedure   Colonoscopy by Dr. Ardis Hughs Impression - Three 2 to 5 mm polyps in the descending colon, in the transverse colon and in the cecum, removed with a cold snare. Complete resection. 2/3 polyps retrieved, sent to pathology. - Rectal tumor that appears malignant, distal edge 7cm from the anal verge. This was biopsied and then labeled with Spot submucosal tattoo. - The examination was otherwise normal on direct and retroflexion views.   11/11/2020 Initial Biopsy   Diagnosis 1. Descending Colon Polyp - TUBULAR ADENOMA (3 OF 3 FRAGMENTS) - NO HIGH-GRADE DYSPLASIA OR MALIGNANCY IDENTIFIED 2. Rectum, biopsy - ADENOCARCINOMA, AT LEAST INTRAMUCOSAL   11/11/2020 Tumor Marker   CEA: 1.4 CA 19-9: 10   11/15/2020 Imaging   Pelvic MRI for local staging IMPRESSION: Signs of potential T3 B disease, tortuosity of the rectum at the level of the tumor makes assessment difficult, distorted by the polypoid eccentric tumor in the high  rectum as described.   N1 disease.   No extramural venous invasion. Tumor extends just to the level of the APR in this center below this level.   11/17/2020 Imaging   CT CAP IMPRESSION: 1. Known rectal mass with a 5 mm in short axis left  mesorectal lymph node better shown on the prior exam and only faintly apparent on the CT. 2. 1.4 by 1.2 by 0.9 cm hypodense lesion in the right hepatic lobe on image 50 of series 2, poorly seen on the delayed images. This lesion is indeterminate by CT and could be benign or malignant. Imaging possibilities for with further workup might include hepatic protocol MRI with and without contrast or PET-CT. 3. Lucent lesion of the left posterior L2 vertebral body potentially extending slightly into the pedicle, measuring 1.9 by 1.4 cm on image 116 of series 5. This previously measured 1.6 by 1.3 cm on 03/28/2010, accordingly making it unlikely to be a metastatic lesion. 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 4. Small right middle lobe and right lower lobe pulmonary nodules are likely benign but merit surveillance in this context.   11/18/2020 Initial Diagnosis   Rectal adenocarcinoma ([place])   11/23/2020 Cancer Staging   Staging form: Colon and Rectum, AJCC 8th Edition - Clinical stage from 11/23/2020: Stage IIIB (cT3, cN1, cM0) - Signed by [name], NP on 11/23/2020 Stage prefix: Initial diagnosis    12/02/2020 Imaging   MRI Liver  IMPRESSION: Tiny benign hemangioma in the right hepatic lobe, which corresponds to the lesion seen on recent CT. No evidence of metastatic disease within the liver or abdomen.   12/05/2020 - 01/11/2021 Radiation Therapy   Concurrent chemoradiation with Dr Lisbeth Renshaw    12/05/2020 - 01/11/2021 Chemotherapy   Concurrent chemoradiation with Xeloda 2020m in the AM and 15074min the PM M-F on days of Radiation   12/23/2020 Genetic Testing   Negative genetic testing:  No pathogenic variants detected on the [facility] CancerNext-Expanded + RNAinsight panel. 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 The report date is 12/23/2020.  The CancerNext-Expanded +  RNAinsight gene panel offered by [employer] includes sequencing and rearrangement analysis for the following 77 genes: AIP, ALK, APC, ATM, AXIN2, BAP1, BARD1, BLM, BMPR1A, BRCA1, BRCA2, BRIP1, CDC73, CDH1, CDK4, CDKN1B, CDKN2A, CHEK2, CTNNA1, DICER1, FANCC, FH, FLCN, GALNT12, KIF1B, LZTR1, MAX, MEN1, MET, MLH1, MSH2, MSH3, MSH6, MUTYH, NBN, NF1, NF2, NTHL1, PALB2, PHOX2B, PMS2, POT1, PRKAR1A, PTCH1, PTEN, RAD51C, RAD51D, RB1, RECQL, RET, SDHA, SDHAF2, SDHB, SDHC, SDHD, SMAD4, SMARCA4, SMARCB1, SMARCE1, STK11, SUFU, TMEM127, TP53, TSC1, TSC2, VHL and XRCC2 (sequencing and deletion/duplication); EGFR, EGLN1, HOXB13, KIT, MITF, PDGFRA, POLD1 and POLE (sequencing only); EPCAM and GREM1 (deletion/duplication only). RNA data is routinely analyzed for use in variant interpretation for all genes.    03/10/2021 Surgery   XI ROBOTIC ASSISTED LOWER ANTERIOR RESECTION WITH LOOP ILEOSTOMY, Dr. ThMarcello MooresFINAL MICROSCOPIC DIAGNOSIS:   A. COLON, RECTOSIGMOID, RESECTION:  - Invasive adenocarcinoma, moderately differentiated, spanning 2 mm.  - Tumor invades the submucosa.  - Resection margins are negative.  - No carcinoma in thirteen of thirteen lymph nodes (0/13).  - See oncology table.   B. FINAL DISTAL MARGIN:  - Benign colonic mucosa.  - No dysplasia or malignancy.    03/10/2021 Cancer Staging   Staging form: Colon and Rectum, AJCC 8th Edition - Pathologic stage from 03/10/2021: Stage  I (ypT1, pN0, cM0) - Signed by [name], MD on 05/31/2021 Stage prefix: Post-therapy Response to neoadjuvant therapy: Partial response Total positive nodes: 0 Histologic grading system: 4 grade system Histologic grade (G): G2 Residual tumor (R): R0 - None    07/18/2021 Imaging   EXAM: CT CHEST, ABDOMEN, AND PELVIS WITH CONTRAST  IMPRESSION: 1. Postsurgical changes of low anterior resection and loop ileostomy formation with edema/soft tissue thickening along the presacral space measuring up to 12 mm in thickness,  favored to reflect postsurgical/post treatment change. Attention on follow-up exams is recommended. 2. No evidence of metastatic disease within the chest, abdomen, or pelvis. 3. Stable small right middle lobe and right lower lobe pulmonary nodules, likely benign. 4. Stable 1.4 cm benign hepatic hemangioma.   10/16/2021 Imaging   EXAM: CT ABDOMEN AND PELVIS WITH CONTRAST  IMPRESSION: 1. Postsurgical change of low anterior resection with interval loop ileostomy takedown and similar, likely post treatment/surgical related, presacral soft tissue thickening. No evidence of new or progressive disease in the chest abdomen or pelvis to suggest recurrence or metastatic disease. 2. Stable tiny right middle lobe pulmonary nodules measuring up to 4 mm, favored benign. Continued attention on follow-up imaging suggested. 3. Stable benign 1.3 cm hepatic hemangioma. 4. 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      INTERVAL HISTORY:  Cesar Herrera is here for a follow up of rectal cancer. He was last seen by me on 09/07/21. He presents to the clinic accompanied by his wife. He reports he is improving with pelvic floor physical therapy.   All other systems were reviewed with the patient and are negative.  MEDICAL HISTORY:  Past Medical History:  Diagnosis Date   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   Family history of bladder cancer    Family history of lymphoma    H/O epididymitis 2011   Seasonal allergies     SURGICAL HISTORY: Past Surgical History:  Procedure Laterality Date   COLONOSCOPY     ILEOSTOMY CLOSURE N/A 08/03/2021   Procedure: ILEOSTOMY CLOSURE;  Surgeon: Leighton Ruff, MD;  Location: [facility];  Service: General;  Laterality: N/A;   LAPAROSCOPY N/A 08/03/2021   Procedure: LAPAROSCOPY DIAGNOSTIC;  Surgeon: Leighton Ruff, MD;  Location: [facility];  Service: General;  Laterality: N/A;   VASECTOMY     WISDOM TOOTH EXTRACTION     XI ROBOTIC ASSISTED LOWER ANTERIOR RESECTION  N/A 03/10/2021   Procedure: XI ROBOTIC ASSISTED LOWER ANTERIOR RESECTION WITH LOOP ILEOSTOMY;  Surgeon: Leighton Ruff, MD;  Location: [facility];  Service: General;  Laterality: N/A;    I have reviewed the social history and family history with the patient and they are unchanged from previous note.  ALLERGIES:  is allergic to erythromycin and penicillins.  MEDICATIONS:  Current Outpatient Medications  Medication Sig Dispense Refill   ALPRAZolam (XANAX) 0.25 MG tablet Take 1 tablet (0.25 mg total) by mouth 2 (two) times daily as needed for anxiety. (Patient not taking: Reported on 09/26/2021) 30 tablet 0   ferrous sulfate 325 (65 FE) MG tablet Take 325 mg by mouth daily with breakfast. (Patient not taking: Reported on 09/26/2021)     loperamide (IMODIUM) 2 MG capsule Take 1 capsule (2 mg total) by mouth as needed for diarrhea or loose stools. Once abd bloating has improved 120 capsule 1   Multiple Vitamins-Minerals (MULTIVITAMIN) tablet Take 1 tablet by mouth daily.     traMADol (ULTRAM) 50 MG tablet Take 1-2 tablets (50-100 mg total) by mouth every  6 (six) hours as needed for moderate pain. (Patient not taking: Reported on 09/26/2021) 20 tablet 0   No current facility-administered medications for this visit.   Facility-Administered Medications Ordered in Other Visits  Medication Dose Route Frequency Provider Last Rate Last Admin   gentamicin (GARAMYCIN) 5 mg/kg in dextrose 5 % 50 mL IVPB  5 mg/kg Intravenous 60 min Pre-Op Leighton Ruff, MD        PHYSICAL EXAMINATION: ECOG PERFORMANCE STATUS: 0 - Asymptomatic  Vitals:   10/19/21 1430  BP: 118/71  Pulse: 81  Resp: 17  Temp: 98.6 F (37 C)  SpO2: 100%   Wt Readings from Last 3 Encounters:  10/19/21 174 lb 4.8 oz (79.1 kg)  09/07/21 170 lb 3.2 oz (77.2 kg)  08/06/21 174 lb 6.1 oz (79.1 kg)     GENERAL:alert, no distress and comfortable SKIN: skin color normal, no rashes or significant lesions EYES: normal, Conjunctiva are pink and  non-injected, sclera clear  NEURO: alert & oriented x 3 with fluent speech  LABORATORY DATA:  I have reviewed the data as listed CBC Latest Ref Rng & Units 10/16/2021 09/07/2021 08/04/2021  WBC 4.0 - 10.5 K/uL 6.0 4.2 6.9  Hemoglobin 13.0 - 17.0 g/dL 14.8 14.1 14.3  Hematocrit 39.0 - 52.0 % 42.7 40.7 41.3  Platelets 150 - 400 K/uL 220 199 203     CMP Latest Ref Rng & Units 10/16/2021 09/07/2021 08/04/2021  Glucose 70 - 99 mg/dL 102(H) 106(H) 120(H)  BUN 6 - 20 mg/dL 13 13 7   Creatinine 0.61 - 1.24 mg/dL 1.13 1.01 1.03  Sodium 135 - 145 mmol/L 138 140 137  Potassium 3.5 - 5.1 mmol/L 4.1 4.2 4.0  Chloride 98 - 111 mmol/L 102 105 107  CO2 22 - 32 mmol/L 29 30 25   Calcium 8.9 - 10.3 mg/dL 9.6 9.1 8.6(L)  Total Protein 6.5 - 8.1 g/dL 7.3 7.0 -  Total Bilirubin 0.3 - 1.2 mg/dL 0.4 0.4 -  Alkaline Phos 38 - 126 U/L 82 76 -  AST 15 - 41 U/L 17 19 -  ALT 0 - 44 U/L 23 25 -      RADIOGRAPHIC STUDIES: I have personally reviewed the radiological images as listed and agreed with the findings in the report. No results found.    No orders of the defined types were placed in this encounter.  All questions were answered. The patient knows to call the clinic with any problems, questions or concerns. No barriers to learning was detected.      [name], MD 10/19/2021   I, Wilburn Mylar, am acting as scribe for [name], MD.   I have reviewed the above documentation for accuracy and completeness, and I agree with the above.

## 2021-10-24 ENCOUNTER — Other Ambulatory Visit: Payer: Self-pay

## 2021-10-24 ENCOUNTER — Ambulatory Visit: Payer: BC Managed Care – PPO

## 2021-10-24 DIAGNOSIS — R279 Unspecified lack of coordination: Secondary | ICD-10-CM | POA: Diagnosis not present

## 2021-10-24 DIAGNOSIS — M6281 Muscle weakness (generalized): Secondary | ICD-10-CM

## 2021-10-24 NOTE — Progress Notes (Signed)
Subjective:    Patient ID: Cesar Herrera, male    DOB: 03/13/1971, 51 y.o.   MRN: [id number]  This visit occurred during the SARS-CoV-2 public health emergency.  Safety protocols were in place, including screening questions prior to the visit, additional usage of staff PPE, and extensive cleaning of exam room while observing appropriate contact time as indicated for disinfecting solutions.    HPI The patient is here for a follow up visit.  Adenocarcinoma of rectum-following with oncology.  He completed neoadjuvant chemotherapy and radiation with Xeloda.  He had resection of the tumor 03/10/2021.  Pathology did show 2 mm residual tumor.  All lymph nodes negative.  Loop ileostomy was performed at that time.  He began adjuvant xeloda.  Had ileostomy reversal 12/22.  CT scan of the abdomen and pelvis this month showed no evidence of new or progressive disease.  He has a surveillance colonoscopy 03/2022.  He is doing pelvic physical therapy.  He thinks he may have a hernia where his ileostomy was.  He is planning on seeing surgery to see if he needs to have this repaired.  It is sticking out more on the right side than the left side.  He has been doing pelvic physical therapy and the therapist is also working on this area as well.   ED-he has been experiencing erectile dysfunction.  He wondered if there is something that could help him    Medications and allergies reviewed with patient and updated if appropriate.  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     No current facility-administered medications on file prior to visit.    Past Medical History:  Diagnosis Date   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   Family history of bladder cancer    Family history of lymphoma    H/O epididymitis 2011   Seasonal allergies     Past Surgical History:  Procedure Laterality Date   COLONOSCOPY     ILEOSTOMY CLOSURE N/A 08/03/2021   Procedure: ILEOSTOMY CLOSURE;  Surgeon: Leighton Ruff, MD;  Location: [facility];  Service: General;  Laterality: N/A;   LAPAROSCOPY N/A 08/03/2021   Procedure: LAPAROSCOPY DIAGNOSTIC;  Surgeon: Leighton Ruff, MD;  Location: [facility];  Service: General;  Laterality: N/A;   VASECTOMY     WISDOM TOOTH EXTRACTION     XI ROBOTIC ASSISTED LOWER ANTERIOR RESECTION N/A 03/10/2021   Procedure: XI ROBOTIC ASSISTED LOWER ANTERIOR RESECTION WITH LOOP ILEOSTOMY;  Surgeon: Leighton Ruff, MD;  Location: [facility];  Service: General;  Laterality: N/A;    Social History   Socioeconomic History   Marital status: Married    Spouse name: Not on file   Number of children: Not on file   Years of education: Not on file   Highest education level: Not on file  Occupational History   Not on file  Tobacco Use   Smoking status: Former    Types: Cigarettes    Quit date: 07/29/2003  Years since quitting: 18.2   Smokeless tobacco: Never   Tobacco comments:    smoked 1993-2004, up to < 4 cigarettes/ day (socially)  Vaping Use   Vaping Use: Never used  Substance and Sexual Activity   Alcohol use: Yes    Comment: 1-2 drinks , wine or beer daily   Drug use: No   Sexual activity: Yes    Birth control/protection: Surgical  Other Topics Concern   Not on file  Social History Narrative   Exercise: mountain biking, hiking, walking - tends to exercise in spurts, does some yoga   Social Determinants of Health   Financial Resource Strain: Not on file  Food Insecurity: Not on  file  Transportation Needs: Not on file  Physical Activity: Not on file  Stress: Not on file  Social Connections: Not on file    Family History  Problem Relation Age of Onset   Diabetes Maternal Uncle    Diabetes Father    Colon polyps Father        1 polyp   Stroke Other        PG aunts & PG uncles   Heart disease Paternal Grandfather    Bladder Cancer Paternal Aunt    Lymphoma Nephew 15   Other Nephew 2       kidney transplant   Cancer Neg Hx    Colon cancer Neg Hx    Stomach cancer Neg Hx    Esophageal cancer Neg Hx     Review of Systems     Objective:   Vitals:   10/25/21 1432  BP: 116/80  Pulse: 78  Temp: 98.6 F (37 C)  SpO2: 97%   BP Readings from Last 3 Encounters:  10/25/21 116/80  10/19/21 118/71  09/07/21 119/76   Wt Readings from Last 3 Encounters:  10/25/21 173 lb (78.5 kg)  10/19/21 174 lb 4.8 oz (79.1 kg)  09/07/21 170 lb 3.2 oz (77.2 kg)   Body mass index is 26.3 kg/m.   Physical Exam         Assessment & Plan:    See Problem List for Assessment and Plan of chronic medical problems.

## 2021-10-24 NOTE — Therapy (Signed)
[facility] @ [place], [place], [address] Phone: [phone]   Fax:  [phone]  Physical Therapy Treatment  Patient Details  Name: Cesar Herrera MRN: [id number] Date of Birth: Mar 04, 1971 Referring Provider (PT): [name], MD   Encounter Date: 10/24/2021   PT End of Session - 10/24/21 1446     Visit Number 5    Date for PT Re-Evaluation 12/19/21    Authorization Type BCBS    PT Start Time 1446    PT Stop Time 5465    PT Time Calculation (min) 39 min    Activity Tolerance Patient tolerated treatment well    Behavior During Therapy [facility] for tasks assessed/performed             Past Medical History:  Diagnosis Date   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   Family history of bladder cancer    Family history of lymphoma    H/O epididymitis 2011   Seasonal allergies     Past Surgical History:  Procedure Laterality Date   COLONOSCOPY     ILEOSTOMY CLOSURE N/A 08/03/2021   Procedure: ILEOSTOMY CLOSURE;  Surgeon: Leighton Ruff, MD;  Location: [facility];  Service: General;  Laterality: N/A;   LAPAROSCOPY N/A 08/03/2021   Procedure: LAPAROSCOPY DIAGNOSTIC;  Surgeon: Leighton Ruff, MD;  Location: [facility];  Service: General;  Laterality: N/A;   VASECTOMY     WISDOM TOOTH EXTRACTION     XI ROBOTIC ASSISTED LOWER ANTERIOR RESECTION N/A 03/10/2021   Procedure: XI ROBOTIC ASSISTED LOWER ANTERIOR RESECTION WITH LOOP ILEOSTOMY;  Surgeon: Leighton Ruff, MD;  Location: [facility];  Service: General;  Laterality: N/A;    There were no vitals filed for this visit.   Subjective Assessment - 10/24/21 1446     Subjective Pt states that he has only had burning/urgency 1x in the last week, which is improvement. He is not having any abdominal pain. He did receive results of CT scan, which is negative for any cancer recurrence, but did find that bulge in Rt side of abdomen is a "fatty" hernia. He states that it sounds like the type of hernia is  not at risk for internal organs bulging through abdominal wall, but just fatty tissue beginning to bulge outward.    Patient Stated Goals Decrease urgency, increase pelvic floor/core strength    Currently in Pain? No/denies    Multiple Pain Sites No                               [facility] Adult PT Treatment/Exercise - 10/24/21 0001       Lumbar Exercises: Standing   Other Standing Lumbar Exercises 3-way kick 10x ea, bil      Lumbar Exercises: Seated   Other Seated Lumbar Exercises Ball lift with good core activation 2 x 10      Lumbar Exercises: Supine   Dead Bug 20 reps      Manual Therapy   Manual Therapy Myofascial release    Myofascial Release Abdominal scar tissue mobilization                     PT Education - 10/24/21 1504     Education Details Pt education performed on safety of continuing to perform core/hip strengthening program with appropriate pressure management, but he was encouraged to pay close attention to making sure he is not holding breath and bearing down.  Person(s) Educated Patient    Methods Explanation;Demonstration;Tactile cues;Verbal cues    Comprehension Verbalized understanding              PT Short Term Goals - 09/26/21 1600       PT SHORT TERM GOAL #1   Title Pt will be independent with HEP.    Time 4    Period Weeks    Status New    Target Date 10/24/21      PT SHORT TERM GOAL #2   Title Pt will be independent with pressure management during exercise and functional activity in order to reduce strain to abdominal scar tissue/    Time 4    Period Weeks    Status New    Target Date 10/24/21               PT Long Term Goals - 09/26/21 1601       PT LONG TERM GOAL #1   Title Pt will be independent with advanced HEP.    Time 12    Period Weeks    Status New    Target Date 12/19/21      PT LONG TERM GOAL #2   Title Pt will perform 2/3 on sit-up test with minimal abdominal bulging and  appropriate pressure management in order to demonstrate improved core strength.    Time 12    Period Weeks    Status New    Target Date 12/19/21      PT LONG TERM GOAL #3   Title Pt will increase all lumbar A/ROM by 50% and hip extension strength by 1 grade in order to improve lumbopelvic mobility and support, reducing abdominal strain.    Time 12    Period Weeks    Status New    Target Date 12/19/21      PT LONG TERM GOAL #4   Title Pt will be independent with urge suppression technique and report <2 episodes of bowel urgency a week.    Time 12    Period Weeks    Status New    Target Date 12/19/21                   Plan - 10/24/21 1506     Clinical Impression Statement Discussed with patient that continuing appropriate core strengthening and pressure management program will be helpful even if MD ends up recommending surgical inervention for abdominal hernia. He did well with all exercise progressions today, but did require multimodal cues to help decrease abdominal pressure and increase core activation. Good improvements and awareness of when he is performing incorrectly. He will continue to benefit from skilled PT intervention in order to progress functional strengthening to tolerance.    PT Treatment/Interventions ADLs/Self Care Home Management;Biofeedback;Cryotherapy;Electrical Stimulation;Moist Heat;Therapeutic activities;Therapeutic exercise;Neuromuscular re-education;Manual techniques;Patient/family education;Scar mobilization;Passive range of motion;Dry needling;Spinal Manipulations    PT Next Visit Plan Continue manual techniques to abdominal scar tissue; progress core and hip strengthening to tolerance.    PT Home Exercise Plan [email or web address]    Consulted and Agree with Plan of Care Patient             Patient will benefit from skilled therapeutic intervention in order to improve the following deficits and impairments:  Decreased coordination, Decreased range of  motion, Increased fascial restricitons, Impaired tone, Decreased endurance, Increased muscle spasms, Pain, Decreased activity tolerance, Decreased scar mobility, Hypomobility, Impaired flexibility, Postural dysfunction, Decreased mobility, Decreased strength  Visit Diagnosis: Muscle weakness (generalized)  Unspecified lack  of coordination     Problem List Patient Active Problem List   Diagnosis Date Noted   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

## 2021-10-24 NOTE — Patient Instructions (Signed)
Access Code: [email or web address] URL: [email or web address] Date: 10/24/2021 Prepared by: Heather Roberts  Exercises Supine Transversus Abdominis Bracing - Hands on Stomach - 1 x daily - 7 x weekly - 1 sets - 10 reps Supine March - 1 x daily - 7 x weekly - 2 sets - 10 reps Supine Transversus Abdominis Bracing with Leg Extension - 1 x daily - 7 x weekly - 2 sets - 10 reps Cat Cow - 1 x daily - 7 x weekly - 2 sets - 10 reps Half Kneeling Hip Flexor Stretch - 1 x daily - 7 x weekly - 1 sets - 2 reps - 30-60 hold Supine Dead Bug with Leg Extension - 1 x daily - 7 x weekly - 2 sets - 10 reps Bird Dog - 1 x daily - 7 x weekly - 1 sets - 10 reps Sidelying Open Book Thoracic Lumbar Rotation and Extension - 1 x daily - 7 x weekly - 1 sets - 10 reps Standing Shoulder Row with Anchored Resistance - 1 x daily - 7 x weekly - 2 sets - 10 reps Shoulder extension with resistance - Neutral - 1 x daily - 7 x weekly - 2 sets - 10 reps Standing Anti-Rotation Press with Anchored Resistance - 1 x daily - 7 x weekly - 2 sets - 10 reps Squat with Chair Touch - 1 x daily - 7 x weekly - 2 sets - 10 reps Standing 3-Way Kick - 1 x daily - 7 x weekly - 3 sets - 10 reps Marching Bridge - 1 x daily - 7 x weekly - 2 sets - 10 reps Single Leg Bridge - 1 x daily - 7 x weekly - 2 sets - 10 reps

## 2021-10-25 ENCOUNTER — Encounter: Payer: Self-pay | Admitting: Internal Medicine

## 2021-10-25 ENCOUNTER — Ambulatory Visit (INDEPENDENT_AMBULATORY_CARE_PROVIDER_SITE_OTHER): Payer: BC Managed Care – PPO | Admitting: Internal Medicine

## 2021-10-25 DIAGNOSIS — N529 Male erectile dysfunction, unspecified: Secondary | ICD-10-CM | POA: Diagnosis not present

## 2021-10-25 DIAGNOSIS — R197 Diarrhea, unspecified: Secondary | ICD-10-CM

## 2021-10-25 DIAGNOSIS — C20 Malignant neoplasm of rectum: Secondary | ICD-10-CM

## 2021-10-25 MED ORDER — LOPERAMIDE HCL 2 MG PO CAPS: 2.0000 mg | ORAL_CAPSULE | ORAL | 3 refills | Status: AC | PRN

## 2021-10-25 MED ORDER — TADALAFIL 5 MG PO TABS: 5.0000 mg | ORAL_TABLET | Freq: Every day | ORAL | 11 refills | Status: DC | PRN

## 2021-10-25 NOTE — Patient Instructions (Addendum)
° ° ° °  Medications changes include :  cialis 5-10 mg daily as needed    Your prescription(s) have been sent to your pharmacy.      Return for CPE.

## 2021-10-25 NOTE — Assessment & Plan Note (Signed)
Chronic since surgery Continue Imodium, Metamucil Will refill Imodium

## 2021-10-25 NOTE — Assessment & Plan Note (Addendum)
New Likely multifactorial-he is doing pelvic physical therapy and are probably some changes to his pelvic musculature in addition to some psychological causes Trial of Cialis 5-10 mg daily as needed.  Can increase dose if needed or try sildenafil Discussed possible side effects

## 2021-10-31 ENCOUNTER — Other Ambulatory Visit: Payer: Self-pay

## 2021-10-31 ENCOUNTER — Ambulatory Visit: Payer: BC Managed Care – PPO

## 2021-10-31 DIAGNOSIS — M6281 Muscle weakness (generalized): Secondary | ICD-10-CM

## 2021-10-31 DIAGNOSIS — R279 Unspecified lack of coordination: Secondary | ICD-10-CM | POA: Diagnosis not present

## 2021-10-31 NOTE — Therapy (Signed)
[facility] @ [place], [place], [address] Phone: [phone]   Fax:  [phone]  Physical Therapy Treatment  Patient Details  Name: Cesar Herrera MRN: [id number] Date of Birth: [birth year] Referring Provider (PT): [name], MD   Encounter Date: 10/31/2021   PT End of Session - 10/31/21 1448     Visit Number 6    Date for PT Re-Evaluation 12/19/21    Authorization Type BCBS    PT Start Time 1446    PT Stop Time 1526    PT Time Calculation (min) 40 min    Activity Tolerance Patient tolerated treatment well    Behavior During Therapy [facility] for tasks assessed/performed             Past Medical History:  Diagnosis Date   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   Family history of bladder cancer    Family history of lymphoma    H/O epididymitis 2011   Seasonal allergies     Past Surgical History:  Procedure Laterality Date   COLONOSCOPY     ILEOSTOMY CLOSURE N/A 08/03/2021   Procedure: ILEOSTOMY CLOSURE;  Surgeon: Leighton Ruff, MD;  [facility];  Service: General;  Laterality: N/A;   LAPAROSCOPY N/A 08/03/2021   Procedure: LAPAROSCOPY DIAGNOSTIC;  Surgeon: Leighton Ruff, MD;  [facility];  Service: General;  Laterality: N/A;   VASECTOMY     WISDOM TOOTH EXTRACTION     XI ROBOTIC ASSISTED LOWER ANTERIOR RESECTION N/A 03/10/2021   Procedure: XI ROBOTIC ASSISTED LOWER ANTERIOR RESECTION WITH LOOP ILEOSTOMY;  Surgeon: Leighton Ruff, MD;  [facility];  Service: General;  Laterality: N/A;    There were no vitals filed for this visit.   Subjective Assessment - 10/31/21 1448     Subjective Pt states that he is feeling very good overall.    Patient Stated Goals Decrease urgency, increase pelvic floor/core strength    Currently in Pain? No/denies    Multiple Pain Sites No                               [facility] Adult PT Treatment/Exercise - 10/31/21 0001       Lumbar Exercises: Standing    Functional Squats 20 reps    Other Standing Lumbar Exercises Pallof press 2 x 10 Bil on airex and in staggered stance, green band; 3-way kick 10x ea, bil on airex    Other Standing Lumbar Exercises Deadlift, 10 lbs, 2 x 10      Lumbar Exercises: Seated   Other Seated Lumbar Exercises Weight lift in athletic stance and good core activation 2 x 10 5 lbs      Manual Therapy   Manual Therapy Myofascial release    Myofascial Release Abdominal scar tissue mobilization                     PT Education - 10/31/21 1505     Education Details Pt education performed on all new exercise progressions.    Person(s) Educated Patient    Methods Explanation;Demonstration;Tactile cues;Verbal cues;Handout    Comprehension Verbalized understanding              PT Short Term Goals - 09/26/21 1600       PT SHORT TERM GOAL #1   Title Pt will be independent with HEP.    Time 4    Period Weeks  Status New    Target Date 10/24/21      PT SHORT TERM GOAL #2   Title Pt will be independent with pressure management during exercise and functional activity in order to reduce strain to abdominal scar tissue/    Time 4    Period Weeks    Status New    Target Date 10/24/21               PT Long Term Goals - 09/26/21 1601       PT LONG TERM GOAL #1   Title Pt will be independent with advanced HEP.    Time 12    Period Weeks    Status New    Target Date 12/19/21      PT LONG TERM GOAL #2   Title Pt will perform 2/3 on sit-up test with minimal abdominal bulging and appropriate pressure management in order to demonstrate improved core strength.    Time 12    Period Weeks    Status New    Target Date 12/19/21      PT LONG TERM GOAL #3   Title Pt will increase all lumbar A/ROM by 50% and hip extension strength by 1 grade in order to improve lumbopelvic mobility and support, reducing abdominal strain.    Time 12    Period Weeks    Status New    Target Date 12/19/21       PT LONG TERM GOAL #4   Title Pt will be independent with urge suppression technique and report <2 episodes of bowel urgency a week.    Time 12    Period Weeks    Status New    Target Date 12/19/21                   Plan - 10/31/21 1506     Clinical Impression Statement Pt overall doing very well. He did demosntrate increased tenderness immediately rt of umbilicus inferior to ileostomy site and further inferior to tenderness more restriction than present last week - this may be due to just increase in activity overall. He denies any increase in pain over the week. He did very well with all exericse progressions to improve core activation in functional positions. He will continue to benefit from skilled PT intervention in order to progress functional strengthening to tolerance.    PT Treatment/Interventions ADLs/Self Care Home Management;Biofeedback;Cryotherapy;Electrical Stimulation;Moist Heat;Therapeutic activities;Therapeutic exercise;Neuromuscular re-education;Manual techniques;Patient/family education;Scar mobilization;Passive range of motion;Dry needling;Spinal Manipulations    PT Next Visit Plan Continue manual techniques to abdominal scar tissue; progress core and hip strengthening to tolerance.    PT Home Exercise Plan [email or web address]    Consulted and Agree with Plan of Care Patient             Patient will benefit from skilled therapeutic intervention in order to improve the following deficits and impairments:  Decreased coordination, Decreased range of motion, Increased fascial restricitons, Impaired tone, Decreased endurance, Increased muscle spasms, Pain, Decreased activity tolerance, Decreased scar mobility, Hypomobility, Impaired flexibility, Postural dysfunction, Decreased mobility, Decreased strength  Visit Diagnosis: Muscle weakness (generalized)  Unspecified lack of coordination     Problem List Patient Active Problem List   Diagnosis Date Noted   Erectile  dysfunction 10/25/2021   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

## 2021-11-06 ENCOUNTER — Encounter: Payer: Self-pay | Admitting: Gastroenterology

## 2021-11-07 ENCOUNTER — Ambulatory Visit: Payer: BC Managed Care – PPO | Attending: Hematology

## 2021-11-07 ENCOUNTER — Other Ambulatory Visit: Payer: Self-pay

## 2021-11-07 DIAGNOSIS — M6281 Muscle weakness (generalized): Secondary | ICD-10-CM | POA: Insufficient documentation

## 2021-11-07 DIAGNOSIS — R279 Unspecified lack of coordination: Secondary | ICD-10-CM | POA: Insufficient documentation

## 2021-11-07 NOTE — Therapy (Addendum)
[facility] @ [place], [place], [address] Phone: [phone]   Fax:  [phone]  Physical Therapy Treatment  Patient Details  Name: Cesar Herrera MRN: [id number] Date of Birth: Jul 12, 1971 Referring Provider (PT): [name], MD   Encounter Date: 11/07/2021   PT End of Session - 11/07/21 1446     Visit Number 7    Date for PT Re-Evaluation 12/19/21    Authorization Type BCBS    PT Start Time 1446    PT Stop Time 1526    PT Time Calculation (min) 40 min    Activity Tolerance Patient tolerated treatment well    Behavior During Therapy [facility] for tasks assessed/performed             Past Medical History:  Diagnosis Date   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   Family history of bladder cancer    Family history of lymphoma    H/O epididymitis 2011   Seasonal allergies     Past Surgical History:  Procedure Laterality Date   COLONOSCOPY     ILEOSTOMY CLOSURE N/A 08/03/2021   Procedure: ILEOSTOMY CLOSURE;  Surgeon: Leighton Ruff, MD;  [facility];  Service: General;  Laterality: N/A;   LAPAROSCOPY N/A 08/03/2021   Procedure: LAPAROSCOPY DIAGNOSTIC;  Surgeon: Leighton Ruff, MD;  [facility];  Service: General;  Laterality: N/A;   VASECTOMY     WISDOM TOOTH EXTRACTION     XI ROBOTIC ASSISTED LOWER ANTERIOR RESECTION N/A 03/10/2021   Procedure: XI ROBOTIC ASSISTED LOWER ANTERIOR RESECTION WITH LOOP ILEOSTOMY;  Surgeon: Leighton Ruff, MD;  [facility];  Service: General;  Laterality: N/A;    There were no vitals filed for this visit.   Subjective Assessment - 11/07/21 1446     Subjective Pt reports no diffiuclty or episodes of urinary urgency/burning since last visit.    Patient Stated Goals Decrease urgency, increase pelvic floor/core strength    Currently in Pain? No/denies    Multiple Pain Sites No                [facility] PT Assessment - 11/07/21 0001       ROM / Strength   AROM / PROM /  Strength AROM      AROM   Overall AROM Comments Lumbar A/ROM WNL                           [facility] Adult PT Treatment/Exercise - 11/07/21 0001       Lumbar Exercises: Standing   Functional Squats 20 reps   5lb weight lift with squat   Other Standing Lumbar Exercises --    Other Standing Lumbar Exercises overhead press 10x bil 8 lbs; bent rows 10x bil 8 lbs      Lumbar Exercises: Supine   Other Supine Lumbar Exercises Supine weight drop 2 x 10 (second set with elevated legs)      Manual Therapy   Manual Therapy Myofascial release    Myofascial Release Abdominal scar tissue mobilization                     PT Education - 11/07/21 1450     Education Details Pt educatio nperformed on all exercise progressions.    Person(s) Educated Patient    Methods Explanation;Demonstration;Tactile cues;Verbal cues;Handout    Comprehension Verbalized understanding              PT  Short Term Goals - 11/07/21 1525       PT SHORT TERM GOAL #1   Title Pt will be independent with HEP.    Time 4    Period Weeks    Status Achieved    Target Date 10/24/21      PT SHORT TERM GOAL #2   Title Pt will be independent with pressure management during exercise and functional activity in order to reduce strain to abdominal scar tissue/    Time 4    Period Weeks    Status Achieved    Target Date 10/24/21               PT Long Term Goals - 11/07/21 1525       PT LONG TERM GOAL #1   Title Pt will be independent with advanced HEP.    Time 12    Period Weeks    Status Achieved    Target Date 12/19/21      PT LONG TERM GOAL #2   Title Pt will perform 2/3 on sit-up test with minimal abdominal bulging and appropriate pressure management in order to demonstrate improved core strength.    Time 12    Period Weeks    Status Partially Met    Target Date 12/19/21      PT LONG TERM GOAL #3   Title Pt will increase all lumbar A/ROM by 50% and hip extension strength  by 1 grade in order to improve lumbopelvic mobility and support, reducing abdominal strain.    Time 12    Period Weeks    Status Achieved    Target Date 12/19/21      PT LONG TERM GOAL #4   Title Pt will be independent with urge suppression technique and report <2 episodes of bowel urgency a week.    Time 12    Period Weeks    Status Achieved    Target Date 12/19/21                   Plan - 11/07/21 1504     Clinical Impression Statement Pt doing very well with no episodes of disocmfort over the last week. He still has same amount of restriction just inferior to ostomy site, but less tenderness today - he reports that he is still working this area as well. Good tolerance to all exercise progressions today with improving core control and decreased fatigue. He will continue to benefit from skilled PT intervention in order to progress functional strengthening to tolerance.    PT Treatment/Interventions ADLs/Self Care Home Management;Biofeedback;Cryotherapy;Electrical Stimulation;Moist Heat;Therapeutic activities;Therapeutic exercise;Neuromuscular re-education;Manual techniques;Patient/family education;Scar mobilization;Passive range of motion;Dry needling;Spinal Manipulations    PT Next Visit Plan Continue manual techniques to abdominal scar tissue; progress core and hip strengthening to tolerance.    PT Home Exercise Plan [email or web address]    Consulted and Agree with Plan of Care Patient             Patient will benefit from skilled therapeutic intervention in order to improve the following deficits and impairments:  Decreased coordination, Decreased range of motion, Increased fascial restricitons, Impaired tone, Decreased endurance, Increased muscle spasms, Pain, Decreased activity tolerance, Decreased scar mobility, Hypomobility, Impaired flexibility, Postural dysfunction, Decreased mobility, Decreased strength  Visit Diagnosis: Muscle weakness (generalized)  Unspecified lack of  coordination     Problem List Patient Active Problem List   Diagnosis Date Noted   Erectile dysfunction 10/25/2021   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   Genetic  testing 12/23/2020   Family history of lymphoma    Family history of bladder cancer    Anxiety about health 11/18/2020   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 Patient goals were partially met. Patient is being discharged due to not returning since the last visit.  Heather Roberts, PT, DPT06/05/239:40 PM

## 2021-11-14 ENCOUNTER — Ambulatory Visit: Payer: BC Managed Care – PPO

## 2021-11-20 ENCOUNTER — Ambulatory Visit: Payer: Self-pay | Admitting: Surgery

## 2021-11-20 DIAGNOSIS — C20 Malignant neoplasm of rectum: Secondary | ICD-10-CM | POA: Diagnosis not present

## 2021-11-20 DIAGNOSIS — K4090 Unilateral inguinal hernia, without obstruction or gangrene, not specified as recurrent: Secondary | ICD-10-CM | POA: Diagnosis not present

## 2021-11-20 DIAGNOSIS — K432 Incisional hernia without obstruction or gangrene: Secondary | ICD-10-CM | POA: Diagnosis not present

## 2022-01-03 ENCOUNTER — Encounter: Payer: Self-pay | Admitting: Gastroenterology

## 2022-01-11 ENCOUNTER — Other Ambulatory Visit: Payer: Self-pay

## 2022-01-11 DIAGNOSIS — D649 Anemia, unspecified: Secondary | ICD-10-CM

## 2022-01-11 DIAGNOSIS — C20 Malignant neoplasm of rectum: Secondary | ICD-10-CM

## 2022-01-12 ENCOUNTER — Inpatient Hospital Stay: Payer: BC Managed Care – PPO | Attending: Nurse Practitioner | Admitting: Hematology

## 2022-01-12 ENCOUNTER — Inpatient Hospital Stay: Payer: BC Managed Care – PPO

## 2022-01-12 ENCOUNTER — Other Ambulatory Visit: Payer: Self-pay

## 2022-01-12 VITALS — BP 129/80 | HR 75 | Temp 98.2°F | Resp 18 | Ht 68.0 in | Wt 178.0 lb

## 2022-01-12 DIAGNOSIS — Z9221 Personal history of antineoplastic chemotherapy: Secondary | ICD-10-CM | POA: Diagnosis not present

## 2022-01-12 DIAGNOSIS — Z923 Personal history of irradiation: Secondary | ICD-10-CM | POA: Diagnosis not present

## 2022-01-12 DIAGNOSIS — D649 Anemia, unspecified: Secondary | ICD-10-CM

## 2022-01-12 DIAGNOSIS — C20 Malignant neoplasm of rectum: Secondary | ICD-10-CM | POA: Insufficient documentation

## 2022-01-12 LAB — IRON AND IRON BINDING CAPACITY (CC-WL,HP ONLY)
Iron: 107 ug/dL (ref 45–182)
Saturation Ratios: 28 % (ref 17.9–39.5)
TIBC: 388 ug/dL (ref 250–450)
UIBC: 281 ug/dL (ref 117–376)

## 2022-01-12 LAB — CBC WITH DIFFERENTIAL (CANCER CENTER ONLY)
Abs Immature Granulocytes: 0.01 10*3/uL (ref 0.00–0.07)
Basophils Absolute: 0 10*3/uL (ref 0.0–0.1)
Basophils Relative: 0 %
Eosinophils Absolute: 0.1 10*3/uL (ref 0.0–0.5)
Eosinophils Relative: 3 %
HCT: 38.1 % — ABNORMAL LOW (ref 39.0–52.0)
Hemoglobin: 13.3 g/dL (ref 13.0–17.0)
Immature Granulocytes: 0 %
Lymphocytes Relative: 13 %
Lymphs Abs: 0.6 10*3/uL — ABNORMAL LOW (ref 0.7–4.0)
MCH: 30.4 pg (ref 26.0–34.0)
MCHC: 34.9 g/dL (ref 30.0–36.0)
MCV: 87.2 fL (ref 80.0–100.0)
Monocytes Absolute: 0.4 10*3/uL (ref 0.1–1.0)
Monocytes Relative: 8 %
Neutro Abs: 3.4 10*3/uL (ref 1.7–7.7)
Neutrophils Relative %: 76 %
Platelet Count: 196 10*3/uL (ref 150–400)
RBC: 4.37 MIL/uL (ref 4.22–5.81)
RDW: 12.6 % (ref 11.5–15.5)
WBC Count: 4.5 10*3/uL (ref 4.0–10.5)
nRBC: 0 % (ref 0.0–0.2)

## 2022-01-12 LAB — CMP (CANCER CENTER ONLY)
ALT: 14 U/L (ref 0–44)
AST: 16 U/L (ref 15–41)
Albumin: 4.1 g/dL (ref 3.5–5.0)
Alkaline Phosphatase: 68 U/L (ref 38–126)
Anion gap: 5 (ref 5–15)
BUN: 13 mg/dL (ref 6–20)
CO2: 29 mmol/L (ref 22–32)
Calcium: 8.8 mg/dL — ABNORMAL LOW (ref 8.9–10.3)
Chloride: 105 mmol/L (ref 98–111)
Creatinine: 1.12 mg/dL (ref 0.61–1.24)
GFR, Estimated: >60 mL/min (ref >60)
Glucose, Bld: 130 mg/dL — ABNORMAL HIGH (ref 70–99)
Potassium: 3.9 mmol/L (ref 3.5–5.1)
Sodium: 139 mmol/L (ref 135–145)
Total Bilirubin: 0.5 mg/dL (ref 0.3–1.2)
Total Protein: 6.6 g/dL (ref 6.5–8.1)

## 2022-01-12 LAB — FERRITIN: Ferritin: 54 ng/mL (ref 24–336)

## 2022-01-12 NOTE — Progress Notes (Signed)
?[facility]   ?[phone] [phone]   ?Clinic Follow up Note  ? ?Patient Care Team: ?[name], MD as PCP - General (Internal Medicine) ?[name], NP as Nurse Practitioner (Nurse Practitioner) ?[name], MD as Consulting Physician (Oncology) ? ?Date of Service:  01/12/2022 ? ?CHIEF COMPLAINT: f/u of rectal cancer ? ?CURRENT THERAPY:  ?Surveillance ? ?ASSESSMENT & PLAN:  ?Cesar Herrera is a 50 y.o. male with  ? ?1. Adenocarcinoma of the rectum, cT3N0/N1, stage II or III, ypT1N0 ?-presented with intermittent loose stool and rectal bleeding. Colonoscopy on 11/11/20 showed upper/mid rectal adenocarcinoma. ?-Staging work-up negative distant metastatic disease ?-Local staging pelvic MRI shows T3N1 disease, however the node is a single 5 mm mesorectal lymph node and could be reactive, could be N0. ?-He completed standard neoadjuvant concurrent chemo/RT with Xeloda 12/05/20 - 01/11/21.  ?-s/p resection and loop ileostomy on 03/10/21 under Dr. [name]. Pathology showed 2 mm residual tumor, all negative lymph nodes (0/13).  ?-He completed 3 months adjuvant Xeloda on 07/09/21. He tolerated well with mild fatigue. ?-he had ileostomy reversal on 08/03/21, benign pathology.  ?-repeat CT AP 10/16/21 showed postsurgical change, no evidence of new or progressive disease. ?-he is scheduled for hernia repair on 02/22/22 with Dr. Johney Maine and surveillance colonoscopy on 03/20/22 with Dr. Ardis Hughs. ?-he is clinically doing well, continuing to improve from treatment. He completed with physical therapy. Labs reviewed, overall WNL.  Exam was unremarkable, including rectal exam.  No clinical concern for recurrence. ?  ?2. Genetics ?-His genetic testing from 12/23/20 was negative for pathogenetic mutations. 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  ?  ?PLAN:  ?-He is currently doing well, no concern for recurrence.   ?-Hernia repair 6/22 ?-colonoscopy 7/18 ?-f/u in 3 months with lab and CT several days  before ? ? ?No problem-specific Assessment & Plan notes found for this encounter. ? ? ?SUMMARY OF ONCOLOGIC HISTORY: ?Oncology History Overview Note  ? Cancer Staging  ?Rectal adenocarcinoma ([place]) ?Staging form: Colon and Rectum, AJCC 8th Edition ?- Clinical stage from 11/23/2020: Stage IIIB (cT3, cN1, cM0) - Signed by [name], NP on 11/23/2020 ?Stage prefix: Initial diagnosis ?- Pathologic stage from 03/10/2021: Stage I (ypT1, pN0, cM0) - Signed by [name], MD on 05/31/2021 ?Stage prefix: Post-therapy ?Response to neoadjuvant therapy: Partial response ?Total positive nodes: 0 ?Histologic grading system: 4 grade system ?Histologic grade (G): G2 ?Residual tumor (R): R0 - None ? ?  ?Rectal adenocarcinoma ([place])  ?11/11/2020 Procedure  ? Colonoscopy by Dr. Ardis Hughs Impression ?- Three 2 to 5 mm polyps in the descending colon, in the transverse colon and in the cecum, ?removed with a cold snare. Complete resection. 2/3 polyps retrieved, sent to pathology. ?- Rectal tumor that appears malignant, distal edge 7cm from the anal verge. This was biopsied ?and then labeled with Spot submucosal tattoo. ?- The examination was otherwise normal on direct and retroflexion views. ?  ?11/11/2020 Initial Biopsy  ? Diagnosis ?1. Descending Colon Polyp ?- TUBULAR ADENOMA (3 OF 3 FRAGMENTS) ?- NO HIGH-GRADE DYSPLASIA OR MALIGNANCY IDENTIFIED ?2. Rectum, biopsy ?- ADENOCARCINOMA, AT LEAST INTRAMUCOSAL ?  ?11/11/2020 Tumor Marker  ? CEA: 1.4 ?CA 19-9: 10 ?  ?11/15/2020 Imaging  ? Pelvic MRI for local staging IMPRESSION: ?Signs of potential T3 B disease, tortuosity of the rectum at the ?level of the tumor makes assessment difficult, distorted by the ?polypoid eccentric tumor in the high rectum as described. ?  ?N1 disease. ?  ?No  extramural venous invasion. Tumor extends just to the level of ?the APR in this center below this level. ?  ?11/17/2020 Imaging  ? CT CAP IMPRESSION: ?1. Known rectal mass with a 5 mm in short axis left mesorectal  lymph node better shown on the prior exam and only faintly apparent on the CT. ?2. 1.4 by 1.2 by 0.9 cm hypodense lesion in the right hepatic lobe ?on image 50 of series 2, poorly seen on the delayed images. This ?lesion is indeterminate by CT and could be benign or malignant. ?Imaging possibilities for with further workup might include hepatic ?protocol MRI with and without contrast or PET-CT. ?3. Lucent lesion of the left posterior L2 vertebral body potentially ?extending slightly into the pedicle, measuring 1.9 by 1.4 cm on ?image 116 of series 5. This previously measured 1.6 by 1.3 cm on ?03/28/2010, accordingly making it unlikely to be a metastatic ?lesion. Given the fairly modest increase in size of the last 11 ?years this is most likely a hemangioma. ?4. Small right middle lobe and right lower lobe pulmonary nodules ?are likely benign but merit surveillance in this context. ?  ?11/18/2020 Initial Diagnosis  ? Rectal adenocarcinoma ([place]) ? ?  ?11/23/2020 Cancer Staging  ? Staging form: Colon and Rectum, AJCC 8th Edition ?- Clinical stage from 11/23/2020: Stage IIIB (cT3, cN1, cM0) - Signed by [name], NP on 11/23/2020 ?Stage prefix: Initial diagnosis ? ?  ?12/02/2020 Imaging  ? MRI Liver  ?IMPRESSION: ?Tiny benign hemangioma in the right hepatic lobe, which corresponds ?to the lesion seen on recent CT. No evidence of metastatic disease ?within the liver or abdomen. ?  ?12/05/2020 - 01/11/2021 Radiation Therapy  ? Concurrent chemoradiation with Dr Lisbeth Renshaw  ?  ?12/05/2020 - 01/11/2021 Chemotherapy  ? Concurrent chemoradiation with Xeloda 2036m in the AM and 15069min the PM M-F on days of Radiation ?  ?12/23/2020 Genetic Testing  ? Negative genetic testing:  No pathogenic variants detected on the [facility] CancerNext-Expanded + RNAinsight panel. 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 The report date is 12/23/2020. ? ?The CancerNext-Expanded + RNAinsight gene  panel offered by [employer] includes sequencing and rearrangement analysis for the following 77 genes: AIP, ALK, APC, ATM, AXIN2, BAP1, BARD1, BLM, BMPR1A, BRCA1, BRCA2, BRIP1, CDC73, CDH1, CDK4, CDKN1B, CDKN2A, CHEK2, CTNNA1, DICER1, FANCC, FH, FLCN, GALNT12, KIF1B, LZTR1, MAX, MEN1, MET, MLH1, MSH2, MSH3, MSH6, MUTYH, NBN, NF1, NF2, NTHL1, PALB2, PHOX2B, PMS2, POT1, PRKAR1A, PTCH1, PTEN, RAD51C, RAD51D, RB1, RECQL, RET, SDHA, SDHAF2, SDHB, SDHC, SDHD, SMAD4, SMARCA4, SMARCB1, SMARCE1, STK11, SUFU, TMEM127, TP53, TSC1, TSC2, VHL and XRCC2 (sequencing and deletion/duplication); EGFR, EGLN1, HOXB13, KIT, MITF, PDGFRA, POLD1 and POLE (sequencing only); EPCAM and GREM1 (deletion/duplication only). RNA data is routinely analyzed for use in variant interpretation for all genes.  ?  ?03/10/2021 Surgery  ? XI ROBOTIC ASSISTED LOWER ANTERIOR RESECTION WITH LOOP ILEOSTOMY, Dr. ThMarcello Moores ?FINAL MICROSCOPIC DIAGNOSIS:  ? ?A. COLON, RECTOSIGMOID, RESECTION:  ?- Invasive adenocarcinoma, moderately differentiated, spanning 2 mm.  ?- Tumor invades the submucosa.  ?- Resection margins are negative.  ?- No carcinoma in thirteen of thirteen lymph nodes (0/13).  ?- See oncology table.  ? ?B. FINAL DISTAL MARGIN:  ?- Benign colonic mucosa.  ?- No dysplasia or malignancy.  ?  ?03/10/2021 Cancer Staging  ? Staging form: Colon and Rectum, AJCC 8th Edition ?- Pathologic stage from 03/10/2021: Stage I (ypT1, pN0, cM0) - Signed by FeTruitt Merle  MD on 05/31/2021 ?Stage prefix: Post-therapy ?Response to neoadjuvant therapy: Partial response ?Total positive nodes: 0 ?Histologic grading system: 4 grade system ?Histologic grade (G): G2 ?Residual tumor (R): R0 - None ? ?  ?07/18/2021 Imaging  ? EXAM: ?CT CHEST, ABDOMEN, AND PELVIS WITH CONTRAST ? ?IMPRESSION: ?1. Postsurgical changes of low anterior resection and loop ileostomy formation with edema/soft tissue thickening along the presacral space measuring up to 12 mm in thickness, favored to reflect  postsurgical/post treatment change. Attention on follow-up exams is recommended. ?2. No evidence of metastatic disease within the chest, abdomen, or ?pelvis. ?3. Stable small right middle lobe and right lower lobe

## 2022-01-15 DIAGNOSIS — L718 Other rosacea: Secondary | ICD-10-CM | POA: Diagnosis not present

## 2022-01-15 DIAGNOSIS — L821 Other seborrheic keratosis: Secondary | ICD-10-CM | POA: Diagnosis not present

## 2022-01-15 DIAGNOSIS — C44519 Basal cell carcinoma of skin of other part of trunk: Secondary | ICD-10-CM | POA: Diagnosis not present

## 2022-01-15 DIAGNOSIS — D2271 Melanocytic nevi of right lower limb, including hip: Secondary | ICD-10-CM | POA: Diagnosis not present

## 2022-01-15 DIAGNOSIS — D225 Melanocytic nevi of trunk: Secondary | ICD-10-CM | POA: Diagnosis not present

## 2022-02-06 NOTE — Progress Notes (Addendum)
COVID Vaccine Completed:  Yes  Date of COVID positive in last 90 days:  PCP - Billey Gosling, MD Cardiologist -   Chest x-ray -  CT chest 07-18-21 Epic EKG -  Stress Test -  ECHO -  Cardiac Cath -  Pacemaker/ICD device last checked: Spinal Cord Stimulator:  Bowel Prep -   Sleep Study -  CPAP -   Fasting Blood Sugar -  Checks Blood Sugar _____ times a day  Blood Thinner Instructions: Aspirin Instructions: Last Dose:  Activity level:  Can go up a flight of stairs and perform activities of daily living without stopping and without symptoms of chest pain or shortness of breath.  Able to exercise without symptoms  Unable to go up a flight of stairs without symptoms of     Anesthesia review:   Patient denies shortness of breath, fever, cough and chest pain at PAT appointment  Patient verbalized understanding of instructions that were given to them at the PAT appointment. Patient was also instructed that they will need to review over the PAT instructions again at home before surgery.

## 2022-02-06 NOTE — Patient Instructions (Addendum)
DUE TO SPACE LIMITATIONS, ONLY TWO VISITORS  (aged 50 and older) ARE ALLOWED TO COME WITH YOU AND STAY IN THE WAITING ROOM DURING YOUR PRE OP AND PROCEDURE.   **NO VISITORS ARE ALLOWED IN THE SHORT STAY AREA OR RECOVERY ROOM!!**  IF YOU WILL BE ADMITTED INTO THE HOSPITAL YOU ARE ALLOWED ONLY FOUR SUPPORT PEOPLE DURING VISITATION HOURS (7 AM -8PM)   The support person(s) must pass our screening, and use Hand sanitizing gel. Visitors GUEST BADGE MUST BE WORN VISIBLY  One adult visitor may remain with you overnight and MUST be in the room by 8 P.M.   You are not required to quarantine at this time prior to your surgery. However, you must do this: Hand Hygiene often Do NOT share personal items Notify your provider if you are in close contact with someone who has COVID or you develop fever 100.4 or greater, new onset of sneezing, cough, sore throat, shortness of breath or body aches.      Your procedure is scheduled on:  02-22-22   Report to [facility] Main Entrance    Report to admitting at 5:15 AM   Call this number if you have problems the morning of surgery [phone]   Do not eat food :After Midnight.   After Midnight you may have the following liquids until 4:30 AM DAY OF SURGERY  Clear Liquid Diet Water Black Coffee (sugar ok, NO MILK/CREAM OR CREAMERS)  Tea (sugar ok, NO MILK/CREAM OR CREAMERS) regular and decaf                             Plain Jell-O (NO RED)                                           Fruit ices (not with fruit pulp, NO RED)                                     Popsicles (NO RED)                                                                  Juice: apple, WHITE grape, WHITE cranberry Sports drinks like Gatorade (NO RED) Clear broth(vegetable,chicken,beef)              Complete TWO (2) Pre-Surgery Clear Ensure the evening before surgery (have completed by 10 PM)     The day of surgery:  Drink ONE (1) Pre-Surgery Clear Ensure at 04:30 AM the  morning of surgery. Drink in one sitting. Do not sip.  This drink was given to you during your hospital  pre-op appointment visit. Nothing else to drink after completing the Pre-Surgery Clear Ensure   If you have questions, please contact your surgeon's office.   FOLLOW ANY ADDITIONAL PRE OP INSTRUCTIONS YOU RECEIVED FROM YOUR SURGEON'S OFFICE!!!    Oral Hygiene is also important to reduce your risk of infection.        Remember - BRUSH YOUR TEETH THE MORNING OF SURGERY WITH YOUR REGULAR TOOTHPASTE  Do NOT smoke after Midnight the night before surgery.  Take ONLY these medicines the morning of surgery with A SIP OF WATER: If needed you may take: Tylenol, Imodium, Lubricant Eye Drops  You may not have any metal on your body including jewelry, and body piercing  Do not wear lotions, powders, cologne, or deodorant  Men may shave face and neck.  Contacts, Hearing Aids, dentures or bridgework may not be worn into surgery.   You may bring a small overnight bag   .[facility] IS NOT RESPONSIBLE  FOR VALUABLES THAT ARE LOST OR STOLEN.    Special Instructions: Bring a copy of your healthcare power of attorney and living will documents the day of surgery, if you wish to have them scanned into your  Medical Records- EPIC  Please read over the following fact sheets you were given: IF YOU HAVE QUESTIONS ABOUT YOUR PRE-OP INSTRUCTIONS, PLEASE CALL [phone] ([place])   [facility] - Preparing for Surgery Before surgery, you can play an important role.  Because skin is not sterile, your skin needs to be as free of germs as possible.  You can reduce the number of germs on your skin by washing with CHG (chlorahexidine gluconate) soap before surgery.  CHG is an antiseptic cleaner which kills germs and bonds with the skin to continue killing germs even after washing. Please DO NOT use if you have an allergy to CHG or antibacterial soaps.  If your skin becomes reddened/irritated stop using  the CHG and inform your nurse when you arrive at Short Stay. Do not shave (including legs and underarms) for at least 48 hours prior to the first CHG shower.  You may shave your face/neck.  Please follow these instructions carefully:  1.  Shower with CHG Soap the night before surgery and the  morning of surgery.  2.  If you choose to wash your hair, wash your hair first as usual with your normal  shampoo.  3.  After you shampoo, rinse your hair and body thoroughly to remove the shampoo.                             4.  Use CHG as you would any other liquid soap.  You can apply chg directly to the skin and wash.  Gently with a scrungie or clean washcloth.  5.  Apply the CHG Soap to your body ONLY FROM THE NECK DOWN.   Do   not use on face/ open                           Wound or open sores. Avoid contact with eyes, ears mouth and   genitals (private parts).                       Wash face,  Genitals (private parts) with your normal soap.             6.  Wash thoroughly, paying special attention to the area where your    surgery  will be performed.  7.  Thoroughly rinse your body with warm water from the neck down.  8.  DO NOT shower/wash with your normal soap after using and rinsing off the CHG Soap.            9.  Pat yourself dry with a clean towel.  10.  Wear clean pajamas.            11.  Place clean sheets on your bed the night of your first shower and do not  sleep with pets.  ON THE DAY OF SURGERY : Do not apply any lotions/deodorants the morning of surgery.  Please wear clean clothes to the hospital/surgery center.    FAILURE TO FOLLOW THESE INSTRUCTIONS MAY RESULT IN THE CANCELLATION OF YOUR SURGERY  PATIENT SIGNATURE_________________________________  NURSE SIGNATURE__________________________________  ________________________________________________________________________

## 2022-02-07 ENCOUNTER — Encounter (HOSPITAL_COMMUNITY): Payer: Self-pay

## 2022-02-07 ENCOUNTER — Encounter (HOSPITAL_COMMUNITY)
Admission: RE | Admit: 2022-02-07 | Discharge: 2022-02-07 | Disposition: A | Discharge status: Home / Self Care | Payer: BC Managed Care – PPO | Source: Ambulatory Visit | Attending: Surgery | Admitting: Surgery

## 2022-02-07 ENCOUNTER — Other Ambulatory Visit: Payer: Self-pay

## 2022-02-07 VITALS — BP 148/94 | HR 72 | Temp 98.3°F | Resp 18 | Ht 67.0 in | Wt 174.6 lb

## 2022-02-07 DIAGNOSIS — D649 Anemia, unspecified: Secondary | ICD-10-CM | POA: Diagnosis not present

## 2022-02-07 DIAGNOSIS — Z01812 Encounter for preprocedural laboratory examination: Secondary | ICD-10-CM | POA: Diagnosis not present

## 2022-02-07 HISTORY — DX: Nausea with vomiting, unspecified: R11.2

## 2022-02-07 HISTORY — DX: Other specified postprocedural states: Z98.890

## 2022-02-07 LAB — CBC
HCT: 44.5 % (ref 39.0–52.0)
Hemoglobin: 15.9 g/dL (ref 13.0–17.0)
MCH: 31.7 pg (ref 26.0–34.0)
MCHC: 35.7 g/dL (ref 30.0–36.0)
MCV: 88.8 fL (ref 80.0–100.0)
Platelets: 223 10*3/uL (ref 150–400)
RBC: 5.01 MIL/uL (ref 4.22–5.81)
RDW: 12.4 % (ref 11.5–15.5)
WBC: 4.1 10*3/uL (ref 4.0–10.5)
nRBC: 0 % (ref 0.0–0.2)

## 2022-02-19 ENCOUNTER — Ambulatory Visit (AMBULATORY_SURGERY_CENTER): Payer: Self-pay | Admitting: *Deleted

## 2022-02-19 ENCOUNTER — Telehealth: Payer: Self-pay | Admitting: *Deleted

## 2022-02-19 VITALS — Ht 68.0 in | Wt 177.0 lb

## 2022-02-19 DIAGNOSIS — C20 Malignant neoplasm of rectum: Secondary | ICD-10-CM

## 2022-02-19 MED ORDER — NA SULFATE-K SULFATE-MG SULF 17.5-3.13-1.6 GM/177ML PO SOLN: 1.0000 | ORAL | 0 refills | Status: DC

## 2022-02-19 NOTE — Progress Notes (Signed)
Patient is here in-person for PV. Patient denies any allergies to eggs or soy. Patient denies any problems with anesthesia/sedation. Patient is not on any oxygen at home. Patient is not taking any diet/weight loss medications or blood thinners. Patient is aware of our care-partner policy. Patient notified to use [facility] for prescription.   EMMI education assigned to the patient for the procedure, sent to [place].

## 2022-02-19 NOTE — Telephone Encounter (Signed)
Dr.Jacobs,  This patient is scheduled for recall colon with you on 03/30/2022. He is having hernia repair on 02/22/2022. During PV I explained that he needs surgical clearance before colonoscopy can be done. Pt verbalizes understanding.  Just wanted you to know. Thanks, Blessen Kimbrough pv

## 2022-02-21 NOTE — Anesthesia Preprocedure Evaluation (Addendum)
Anesthesia Evaluation  Patient identified by MRN, date of birth, ID band Patient awake    Reviewed: Allergy & Precautions, NPO status , Patient's Chart, lab work & pertinent test results  History of Anesthesia Complications (+) PONV and history of anesthetic complications  Airway Mallampati: II  TM Distance: >3 FB Neck ROM: Full    Dental  (+) Teeth Intact, Dental Advisory Given   Pulmonary neg pulmonary ROS, former smoker,    Pulmonary exam normal breath sounds clear to auscultation       Cardiovascular negative cardio ROS Normal cardiovascular exam Rhythm:Regular Rate:Normal     Neuro/Psych PSYCHIATRIC DISORDERS Anxiety negative neurological ROS     GI/Hepatic negative GI ROS, Neg liver ROS,   Endo/Other  negative endocrine ROS  Renal/GU negative Renal ROS  negative genitourinary   Musculoskeletal negative musculoskeletal ROS (+)   Abdominal   Peds  Hematology negative hematology ROS (+)   Anesthesia Other Findings H/o rectal CA  Reproductive/Obstetrics                            Anesthesia Physical Anesthesia Plan  ASA: 2  Anesthesia Plan: General   Post-op Pain Management: Tylenol PO (pre-op)* and Ketamine IV*   Induction: Intravenous  PONV Risk Score and Plan: 3 and Midazolam, Dexamethasone and Ondansetron  Airway Management Planned: Oral ETT  Additional Equipment:   Intra-op Plan:   Post-operative Plan: Extubation in OR  Informed Consent: I have reviewed the patients History and Physical, chart, labs and discussed the procedure including the risks, benefits and alternatives for the proposed anesthesia with the patient or authorized representative who has indicated his/her understanding and acceptance.     Dental advisory given  Plan Discussed with: CRNA  Anesthesia Plan Comments:         Anesthesia Quick Evaluation

## 2022-02-22 ENCOUNTER — Ambulatory Visit (HOSPITAL_COMMUNITY): Payer: BC Managed Care – PPO | Admitting: Certified Registered Nurse Anesthetist

## 2022-02-22 ENCOUNTER — Encounter (HOSPITAL_COMMUNITY): Payer: Self-pay | Admitting: Surgery

## 2022-02-22 ENCOUNTER — Encounter (HOSPITAL_COMMUNITY)
Admission: RE | Disposition: A | Discharge status: Home / Self Care | Payer: Self-pay | Source: Home / Self Care | Attending: Surgery

## 2022-02-22 ENCOUNTER — Observation Stay (HOSPITAL_COMMUNITY)
Admission: RE | Admit: 2022-02-22 | Discharge: 2022-02-23 | Disposition: A | Discharge status: Home / Self Care | Payer: BC Managed Care – PPO | Attending: Surgery | Admitting: Surgery

## 2022-02-22 ENCOUNTER — Other Ambulatory Visit: Payer: Self-pay

## 2022-02-22 DIAGNOSIS — D649 Anemia, unspecified: Secondary | ICD-10-CM

## 2022-02-22 DIAGNOSIS — K4020 Bilateral inguinal hernia, without obstruction or gangrene, not specified as recurrent: Secondary | ICD-10-CM | POA: Diagnosis not present

## 2022-02-22 DIAGNOSIS — K4120 Bilateral femoral hernia, without obstruction or gangrene, not specified as recurrent: Secondary | ICD-10-CM | POA: Insufficient documentation

## 2022-02-22 DIAGNOSIS — Z85048 Personal history of other malignant neoplasm of rectum, rectosigmoid junction, and anus: Secondary | ICD-10-CM | POA: Insufficient documentation

## 2022-02-22 DIAGNOSIS — K430 Incisional hernia with obstruction, without gangrene: Secondary | ICD-10-CM | POA: Insufficient documentation

## 2022-02-22 DIAGNOSIS — Z87891 Personal history of nicotine dependence: Secondary | ICD-10-CM | POA: Diagnosis not present

## 2022-02-22 DIAGNOSIS — K432 Incisional hernia without obstruction or gangrene: Secondary | ICD-10-CM | POA: Diagnosis present

## 2022-02-22 HISTORY — PX: INGUINAL HERNIA REPAIR: SHX194

## 2022-02-22 HISTORY — PX: VENTRAL HERNIA REPAIR: SHX424

## 2022-02-22 HISTORY — PX: HERNIA REPAIR: SHX51

## 2022-02-22 LAB — CBC
HCT: 42 % (ref 39.0–52.0)
Hemoglobin: 14 g/dL (ref 13.0–17.0)
MCH: 30 pg (ref 26.0–34.0)
MCHC: 33.3 g/dL (ref 30.0–36.0)
MCV: 90.1 fL (ref 80.0–100.0)
Platelets: 180 10*3/uL (ref 150–400)
RBC: 4.66 MIL/uL (ref 4.22–5.81)
RDW: 12.1 % (ref 11.5–15.5)
WBC: 6.8 10*3/uL (ref 4.0–10.5)
nRBC: 0 % (ref 0.0–0.2)

## 2022-02-22 LAB — CREATININE, SERUM
Creatinine, Ser: 1.05 mg/dL (ref 0.61–1.24)
GFR, Estimated: >60 mL/min (ref >60)

## 2022-02-22 SURGERY — REPAIR, HERNIA, VENTRAL, LAPAROSCOPIC
Anesthesia: General

## 2022-02-22 MED ORDER — SODIUM CHLORIDE 0.9 % IV SOLN: 250.0000 mL | INTRAVENOUS | Status: DC | PRN

## 2022-02-22 MED ORDER — GABAPENTIN 300 MG PO CAPS
300.0000 mg | ORAL_CAPSULE | Freq: Two times a day (BID) | ORAL | Status: DC
  Administered 2022-02-22 – 2022-02-23 (×2): 300 mg via ORAL
  Filled 2022-02-22 (×2): qty 1

## 2022-02-22 MED ORDER — CHLORHEXIDINE GLUCONATE CLOTH 2 % EX PADS: 6.0000 | MEDICATED_PAD | Freq: Once | CUTANEOUS | Status: DC

## 2022-02-22 MED ORDER — OXYCODONE HCL 5 MG PO TABS
5.0000 mg | ORAL_TABLET | ORAL | Status: DC | PRN
  Administered 2022-02-22 – 2022-02-23 (×3): 10 mg via ORAL
  Administered 2022-02-23: 5 mg via ORAL
  Filled 2022-02-22 (×2): qty 2
  Filled 2022-02-22 (×3): qty 1

## 2022-02-22 MED ORDER — SIMETHICONE 80 MG PO CHEW: 40.0000 mg | CHEWABLE_TABLET | Freq: Four times a day (QID) | ORAL | Status: DC | PRN

## 2022-02-22 MED ORDER — EPHEDRINE SULFATE-NACL 50-0.9 MG/10ML-% IV SOSY
PREFILLED_SYRINGE | INTRAVENOUS | Status: DC | PRN
  Administered 2022-02-22: 5 mg via INTRAVENOUS

## 2022-02-22 MED ORDER — ACETAMINOPHEN 500 MG PO TABS
1000.0000 mg | ORAL_TABLET | ORAL | Status: AC
  Administered 2022-02-22: 1000 mg via ORAL
  Filled 2022-02-22: qty 2

## 2022-02-22 MED ORDER — KETAMINE HCL 10 MG/ML IJ SOLN
INTRAMUSCULAR | Status: AC
  Filled 2022-02-22: qty 1

## 2022-02-22 MED ORDER — PROCHLORPERAZINE MALEATE 10 MG PO TABS: 10.0000 mg | ORAL_TABLET | Freq: Four times a day (QID) | ORAL | Status: DC | PRN

## 2022-02-22 MED ORDER — METOPROLOL TARTRATE 5 MG/5ML IV SOLN: 5.0000 mg | Freq: Four times a day (QID) | INTRAVENOUS | Status: DC | PRN

## 2022-02-22 MED ORDER — BUPIVACAINE-EPINEPHRINE (PF) 0.25% -1:200000 IJ SOLN
INTRAMUSCULAR | Status: AC
Start: 2022-02-22 — End: ?
  Filled 2022-02-22: qty 60

## 2022-02-22 MED ORDER — CELECOXIB 200 MG PO CAPS
200.0000 mg | ORAL_CAPSULE | ORAL | Status: AC
  Administered 2022-02-22: 200 mg via ORAL
  Filled 2022-02-22: qty 1

## 2022-02-22 MED ORDER — METHOCARBAMOL 750 MG PO TABS: 750.0000 mg | ORAL_TABLET | Freq: Four times a day (QID) | ORAL | 2 refills | Status: DC | PRN

## 2022-02-22 MED ORDER — LIDOCAINE 2% (20 MG/ML) 5 ML SYRINGE
INTRAMUSCULAR | Status: DC | PRN
  Administered 2022-02-22: 60 mg via INTRAVENOUS

## 2022-02-22 MED ORDER — FENTANYL CITRATE PF 50 MCG/ML IJ SOSY
25.0000 ug | PREFILLED_SYRINGE | INTRAMUSCULAR | Status: DC | PRN
  Administered 2022-02-22 (×2): 50 ug via INTRAVENOUS

## 2022-02-22 MED ORDER — PROPOFOL 10 MG/ML IV BOLUS
INTRAVENOUS | Status: DC | PRN
  Administered 2022-02-22: 150 mg via INTRAVENOUS

## 2022-02-22 MED ORDER — DEXAMETHASONE SODIUM PHOSPHATE 4 MG/ML IJ SOLN
INTRAMUSCULAR | Status: DC | PRN
  Administered 2022-02-22: 10 mg via INTRAVENOUS

## 2022-02-22 MED ORDER — METHOCARBAMOL 1000 MG/10ML IJ SOLN
1000.0000 mg | Freq: Four times a day (QID) | INTRAVENOUS | Status: DC | PRN
  Filled 2022-02-22: qty 10

## 2022-02-22 MED ORDER — PHENYLEPHRINE 80 MCG/ML (10ML) SYRINGE FOR IV PUSH (FOR BLOOD PRESSURE SUPPORT)
PREFILLED_SYRINGE | INTRAVENOUS | Status: AC
  Filled 2022-02-22: qty 10

## 2022-02-22 MED ORDER — SODIUM CHLORIDE 0.9 % IV SOLN: 1000.0000 mL | Freq: Three times a day (TID) | INTRAVENOUS | Status: DC | PRN

## 2022-02-22 MED ORDER — ONDANSETRON 4 MG PO TBDP: 4.0000 mg | ORAL_TABLET | Freq: Four times a day (QID) | ORAL | Status: DC | PRN

## 2022-02-22 MED ORDER — ROCURONIUM BROMIDE 10 MG/ML (PF) SYRINGE
PREFILLED_SYRINGE | INTRAVENOUS | Status: AC
  Filled 2022-02-22: qty 10

## 2022-02-22 MED ORDER — MAGNESIUM HYDROXIDE 400 MG/5ML PO SUSP: 30.0000 mL | Freq: Every day | ORAL | Status: DC | PRN

## 2022-02-22 MED ORDER — GENTAMICIN SULFATE 40 MG/ML IJ SOLN
5.0000 mg/kg | INTRAVENOUS | Status: AC
  Administered 2022-02-22: 400 mg via INTRAVENOUS
  Filled 2022-02-22: qty 10

## 2022-02-22 MED ORDER — PROMETHAZINE HCL 25 MG/ML IJ SOLN
INTRAMUSCULAR | Status: DC | PRN
  Administered 2022-02-22: 6.25 mg via INTRAVENOUS

## 2022-02-22 MED ORDER — KETAMINE HCL 10 MG/ML IJ SOLN
INTRAMUSCULAR | Status: DC | PRN
  Administered 2022-02-22: 10 mg via INTRAVENOUS
  Administered 2022-02-22: 30 mg via INTRAVENOUS

## 2022-02-22 MED ORDER — 0.9 % SODIUM CHLORIDE (POUR BTL) OPTIME
TOPICAL | Status: DC | PRN
  Administered 2022-02-22: 1000 mL

## 2022-02-22 MED ORDER — ACETAMINOPHEN 500 MG PO TABS: 1000.0000 mg | ORAL_TABLET | Freq: Once | ORAL | Status: DC

## 2022-02-22 MED ORDER — METHOCARBAMOL 500 MG PO TABS: 1000.0000 mg | ORAL_TABLET | Freq: Four times a day (QID) | ORAL | Status: DC | PRN

## 2022-02-22 MED ORDER — LACTATED RINGERS IV BOLUS: 1000.0000 mL | Freq: Three times a day (TID) | INTRAVENOUS | Status: DC | PRN

## 2022-02-22 MED ORDER — LACTATED RINGERS IV SOLN: INTRAVENOUS | Status: DC

## 2022-02-22 MED ORDER — ENSURE PRE-SURGERY PO LIQD
296.0000 mL | Freq: Once | ORAL | Status: DC
  Filled 2022-02-22: qty 296

## 2022-02-22 MED ORDER — SUGAMMADEX SODIUM 200 MG/2ML IV SOLN
INTRAVENOUS | Status: DC | PRN
  Administered 2022-02-22: 200 mg via INTRAVENOUS

## 2022-02-22 MED ORDER — CALCIUM POLYCARBOPHIL 625 MG PO TABS
625.0000 mg | ORAL_TABLET | Freq: Two times a day (BID) | ORAL | Status: DC
  Administered 2022-02-22 – 2022-02-23 (×3): 625 mg via ORAL
  Filled 2022-02-22 (×3): qty 1

## 2022-02-22 MED ORDER — ONDANSETRON HCL 4 MG/2ML IJ SOLN: 4.0000 mg | Freq: Four times a day (QID) | INTRAMUSCULAR | Status: DC | PRN

## 2022-02-22 MED ORDER — DIPHENHYDRAMINE HCL 50 MG/ML IJ SOLN: 12.5000 mg | Freq: Four times a day (QID) | INTRAMUSCULAR | Status: DC | PRN

## 2022-02-22 MED ORDER — PROPOFOL 500 MG/50ML IV EMUL
INTRAVENOUS | Status: DC | PRN
  Administered 2022-02-22: 25 ug/kg/min via INTRAVENOUS

## 2022-02-22 MED ORDER — ONDANSETRON HCL 4 MG/2ML IJ SOLN
INTRAMUSCULAR | Status: DC | PRN
  Administered 2022-02-22 (×2): 4 mg via INTRAVENOUS

## 2022-02-22 MED ORDER — FENTANYL CITRATE PF 50 MCG/ML IJ SOSY
PREFILLED_SYRINGE | INTRAMUSCULAR | Status: AC
  Administered 2022-02-22: 50 ug via INTRAVENOUS
  Filled 2022-02-22: qty 3

## 2022-02-22 MED ORDER — BUPIVACAINE-EPINEPHRINE 0.25% -1:200000 IJ SOLN
INTRAMUSCULAR | Status: DC | PRN
  Administered 2022-02-22: 60 mL

## 2022-02-22 MED ORDER — BUPIVACAINE LIPOSOME 1.3 % IJ SUSP
INTRAMUSCULAR | Status: AC
  Filled 2022-02-22: qty 20

## 2022-02-22 MED ORDER — ORAL CARE MOUTH RINSE: 15.0000 mL | Freq: Once | OROMUCOSAL | Status: AC

## 2022-02-22 MED ORDER — DIPHENHYDRAMINE HCL 12.5 MG/5ML PO ELIX: 12.5000 mg | ORAL_SOLUTION | Freq: Four times a day (QID) | ORAL | Status: DC | PRN

## 2022-02-22 MED ORDER — FENTANYL CITRATE (PF) 250 MCG/5ML IJ SOLN
INTRAMUSCULAR | Status: AC
  Filled 2022-02-22: qty 5

## 2022-02-22 MED ORDER — PHENYLEPHRINE 80 MCG/ML (10ML) SYRINGE FOR IV PUSH (FOR BLOOD PRESSURE SUPPORT)
PREFILLED_SYRINGE | INTRAVENOUS | Status: DC | PRN
  Administered 2022-02-22 (×4): 80 ug via INTRAVENOUS

## 2022-02-22 MED ORDER — ACETAMINOPHEN 500 MG PO TABS
1000.0000 mg | ORAL_TABLET | Freq: Four times a day (QID) | ORAL | Status: DC
  Administered 2022-02-22 – 2022-02-23 (×2): 1000 mg via ORAL
  Filled 2022-02-22 (×2): qty 2

## 2022-02-22 MED ORDER — LIP MEDEX EX OINT
TOPICAL_OINTMENT | Freq: Two times a day (BID) | CUTANEOUS | Status: DC
  Administered 2022-02-22: 75 via TOPICAL
  Filled 2022-02-22: qty 7

## 2022-02-22 MED ORDER — MAGIC MOUTHWASH: 15.0000 mL | Freq: Four times a day (QID) | ORAL | Status: DC | PRN

## 2022-02-22 MED ORDER — GABAPENTIN 300 MG PO CAPS
300.0000 mg | ORAL_CAPSULE | ORAL | Status: AC
  Administered 2022-02-22: 300 mg via ORAL
  Filled 2022-02-22: qty 1

## 2022-02-22 MED ORDER — MIDAZOLAM HCL 5 MG/5ML IJ SOLN
INTRAMUSCULAR | Status: DC | PRN
  Administered 2022-02-22: 2 mg via INTRAVENOUS

## 2022-02-22 MED ORDER — LIDOCAINE HCL (PF) 2 % IJ SOLN
INTRAMUSCULAR | Status: AC
  Filled 2022-02-22: qty 5

## 2022-02-22 MED ORDER — ENOXAPARIN SODIUM 40 MG/0.4ML IJ SOSY: 40.0000 mg | PREFILLED_SYRINGE | INTRAMUSCULAR | Status: DC

## 2022-02-22 MED ORDER — BUPIVACAINE LIPOSOME 1.3 % IJ SUSP: 20.0000 mL | Freq: Once | INTRAMUSCULAR | Status: DC

## 2022-02-22 MED ORDER — SCOPOLAMINE 1 MG/3DAYS TD PT72
MEDICATED_PATCH | TRANSDERMAL | Status: DC | PRN
  Administered 2022-02-22: 1 via TRANSDERMAL

## 2022-02-22 MED ORDER — PROPOFOL 1000 MG/100ML IV EMUL
INTRAVENOUS | Status: AC
  Filled 2022-02-22: qty 100

## 2022-02-22 MED ORDER — DEXAMETHASONE SODIUM PHOSPHATE 10 MG/ML IJ SOLN
INTRAMUSCULAR | Status: AC
  Filled 2022-02-22: qty 1

## 2022-02-22 MED ORDER — BUPIVACAINE LIPOSOME 1.3 % IJ SUSP
INTRAMUSCULAR | Status: DC | PRN
  Administered 2022-02-22: 20 mL

## 2022-02-22 MED ORDER — SODIUM CHLORIDE 0.9% FLUSH: 3.0000 mL | INTRAVENOUS | Status: DC | PRN

## 2022-02-22 MED ORDER — ONDANSETRON HCL 4 MG/2ML IJ SOLN
INTRAMUSCULAR | Status: AC
  Filled 2022-02-22: qty 2

## 2022-02-22 MED ORDER — MIDAZOLAM HCL 2 MG/2ML IJ SOLN
INTRAMUSCULAR | Status: AC
  Filled 2022-02-22: qty 2

## 2022-02-22 MED ORDER — ENSURE PRE-SURGERY PO LIQD
592.0000 mL | Freq: Once | ORAL | Status: DC
  Filled 2022-02-22: qty 592

## 2022-02-22 MED ORDER — OXYCODONE HCL 5 MG PO TABS: 5.0000 mg | ORAL_TABLET | Freq: Four times a day (QID) | ORAL | 0 refills | Status: DC | PRN

## 2022-02-22 MED ORDER — ADULT MULTIVITAMIN W/MINERALS CH
1.0000 | ORAL_TABLET | ORAL | Status: DC
  Administered 2022-02-23: 1 via ORAL
  Filled 2022-02-22: qty 1

## 2022-02-22 MED ORDER — CLINDAMYCIN PHOSPHATE 900 MG/50ML IV SOLN
900.0000 mg | INTRAVENOUS | Status: AC
  Administered 2022-02-22: 900 mg via INTRAVENOUS
  Filled 2022-02-22: qty 50

## 2022-02-22 MED ORDER — FENTANYL CITRATE (PF) 100 MCG/2ML IJ SOLN
INTRAMUSCULAR | Status: DC | PRN
  Administered 2022-02-22: 100 ug via INTRAVENOUS
  Administered 2022-02-22: 50 ug via INTRAVENOUS
  Administered 2022-02-22: 100 ug via INTRAVENOUS

## 2022-02-22 MED ORDER — ROCURONIUM BROMIDE 10 MG/ML (PF) SYRINGE
PREFILLED_SYRINGE | INTRAVENOUS | Status: DC | PRN
  Administered 2022-02-22: 80 mg via INTRAVENOUS
  Administered 2022-02-22 (×3): 20 mg via INTRAVENOUS

## 2022-02-22 MED ORDER — TRAMADOL HCL 50 MG PO TABS: 50.0000 mg | ORAL_TABLET | Freq: Four times a day (QID) | ORAL | Status: DC | PRN

## 2022-02-22 MED ORDER — POLYVINYL ALCOHOL 1.4 % OP SOLN: 1.0000 [drp] | Freq: Three times a day (TID) | OPHTHALMIC | Status: DC | PRN

## 2022-02-22 MED ORDER — SODIUM CHLORIDE 0.9% FLUSH
3.0000 mL | Freq: Two times a day (BID) | INTRAVENOUS | Status: DC
  Administered 2022-02-22 – 2022-02-23 (×3): 3 mL via INTRAVENOUS

## 2022-02-22 MED ORDER — SCOPOLAMINE 1 MG/3DAYS TD PT72
MEDICATED_PATCH | TRANSDERMAL | Status: AC
  Filled 2022-02-22: qty 1

## 2022-02-22 MED ORDER — PROMETHAZINE HCL 25 MG/ML IJ SOLN
INTRAMUSCULAR | Status: AC
  Filled 2022-02-22: qty 1

## 2022-02-22 MED ORDER — PROCHLORPERAZINE EDISYLATE 10 MG/2ML IJ SOLN: 5.0000 mg | Freq: Four times a day (QID) | INTRAMUSCULAR | Status: DC | PRN

## 2022-02-22 MED ORDER — HYDROMORPHONE HCL 1 MG/ML IJ SOLN: 0.5000 mg | INTRAMUSCULAR | Status: DC | PRN

## 2022-02-22 MED ORDER — BISACODYL 10 MG RE SUPP: 10.0000 mg | Freq: Every day | RECTAL | Status: DC | PRN

## 2022-02-22 MED ORDER — CHLORHEXIDINE GLUCONATE 0.12 % MT SOLN
15.0000 mL | Freq: Once | OROMUCOSAL | Status: AC
  Administered 2022-02-22: 15 mL via OROMUCOSAL

## 2022-02-22 MED ORDER — EPHEDRINE 5 MG/ML INJ
INTRAVENOUS | Status: AC
  Filled 2022-02-22: qty 5

## 2022-02-22 SURGICAL SUPPLY — 54 items
APPLIER CLIP 5 13 M/L LIGAMAX5 (MISCELLANEOUS)
BAG COUNTER SPONGE SURGICOUNT (BAG) ×3 IMPLANT
BINDER ABDOMINAL 12 ML 46-62 (SOFTGOODS) IMPLANT
CABLE HIGH FREQUENCY MONO STRZ (ELECTRODE) ×3 IMPLANT
CHLORAPREP W/TINT 26 (MISCELLANEOUS) ×3 IMPLANT
CLIP APPLIE 5 13 M/L LIGAMAX5 (MISCELLANEOUS) IMPLANT
COVER SURGICAL LIGHT HANDLE (MISCELLANEOUS) ×3 IMPLANT
DEVICE SECURE STRAP 25 ABSORB (INSTRUMENTS) IMPLANT
DEVICE TROCAR PUNCTURE CLOSURE (ENDOMECHANICALS) ×3 IMPLANT
DRAPE WARM FLUID 44X44 (DRAPES) ×3 IMPLANT
DRSG TEGADERM 2-3/8X2-3/4 SM (GAUZE/BANDAGES/DRESSINGS) ×7 IMPLANT
DRSG TEGADERM 4X4.75 (GAUZE/BANDAGES/DRESSINGS) ×3 IMPLANT
ELECT REM PT RETURN 15FT ADLT (MISCELLANEOUS) ×3 IMPLANT
GAUZE SPONGE 2X2 8PLY STRL LF (GAUZE/BANDAGES/DRESSINGS) ×2 IMPLANT
GLOVE ECLIPSE 8.0 STRL XLNG CF (GLOVE) ×3 IMPLANT
GLOVE INDICATOR 8.0 STRL GRN (GLOVE) ×3 IMPLANT
GOWN STRL REUS W/ TWL XL LVL3 (GOWN DISPOSABLE) ×6 IMPLANT
GOWN STRL REUS W/TWL XL LVL3 (GOWN DISPOSABLE) ×9
IRRIG SUCT STRYKERFLOW 2 WTIP (MISCELLANEOUS)
IRRIGATION SUCT STRKRFLW 2 WTP (MISCELLANEOUS) IMPLANT
KIT BASIN OR (CUSTOM PROCEDURE TRAY) ×3 IMPLANT
KIT TURNOVER KIT A (KITS) IMPLANT
MARKER SKIN DUAL TIP RULER LAB (MISCELLANEOUS) ×3 IMPLANT
MESH BARD SOFT 6X6IN (Mesh General) ×2 IMPLANT
MESH HERNIA 6X6 BARD (Mesh General) IMPLANT
MESH HERNIA BARD 6X6 (Mesh General) ×1 IMPLANT
MESH VENTRALIGHT ST 8X10 (Mesh General) ×1 IMPLANT
NDL INSUFFLATION 14GA 120MM (NEEDLE) IMPLANT
NDL SPNL 22GX3.5 QUINCKE BK (NEEDLE) IMPLANT
NEEDLE INSUFFLATION 14GA 120MM (NEEDLE) IMPLANT
NEEDLE SPNL 22GX3.5 QUINCKE BK (NEEDLE) IMPLANT
PAD POSITIONING PINK XL (MISCELLANEOUS) ×3 IMPLANT
PENCIL SMOKE EVACUATOR (MISCELLANEOUS) ×1 IMPLANT
SCISSORS LAP 5X35 DISP (ENDOMECHANICALS) ×3 IMPLANT
SET TUBE SMOKE EVAC HIGH FLOW (TUBING) ×3 IMPLANT
SLEEVE ADV FIXATION 5X100MM (TROCAR) ×3 IMPLANT
SPIKE FLUID TRANSFER (MISCELLANEOUS) ×3 IMPLANT
SPONGE GAUZE 2X2 STER 10/PKG (GAUZE/BANDAGES/DRESSINGS) ×1
STRIP CLOSURE SKIN 1/2X4 (GAUZE/BANDAGES/DRESSINGS) ×5 IMPLANT
SUT MNCRL AB 4-0 PS2 18 (SUTURE) ×4 IMPLANT
SUT PDS AB 1 CT1 27 (SUTURE) ×7 IMPLANT
SUT PROLENE 1 CT 1 30 (SUTURE) ×16 IMPLANT
SUT VIC AB 2-0 SH 18 (SUTURE) ×1 IMPLANT
SUT VIC AB 2-0 SH 27 (SUTURE) ×3
SUT VIC AB 2-0 SH 27X BRD (SUTURE) IMPLANT
SUT VICRYL 0 UR6 27IN ABS (SUTURE) ×3 IMPLANT
TACKER 5MM HERNIA 3.5CML NAB (ENDOMECHANICALS) IMPLANT
TOWEL OR 17X26 10 PK STRL BLUE (TOWEL DISPOSABLE) ×3 IMPLANT
TOWEL OR NON WOVEN STRL DISP B (DISPOSABLE) ×3 IMPLANT
TRAY LAPAROSCOPIC (CUSTOM PROCEDURE TRAY) ×3 IMPLANT
TROCAR 11X100 Z THREAD (TROCAR) IMPLANT
TROCAR ADV FIXATION 5X100MM (TROCAR) ×3 IMPLANT
TROCAR BALLN 12MMX100 BLUNT (TROCAR) ×3 IMPLANT
TROCAR Z-THREAD OPTICAL 5X100M (TROCAR) ×3 IMPLANT

## 2022-02-22 NOTE — Anesthesia Procedure Notes (Signed)
Procedure Name: Intubation Date/Time: 02/22/2022 7:40 AM  Performed by: Claudia Desanctis, CRNAPre-anesthesia Checklist: Patient identified, Emergency Drugs available, Suction available and Patient being monitored Patient Re-evaluated:Patient Re-evaluated prior to induction Oxygen Delivery Method: Circle system utilized Preoxygenation: Pre-oxygenation with 100% oxygen Induction Type: IV induction Ventilation: Mask ventilation without difficulty Laryngoscope Size: 2 and Miller Grade View: Grade I Tube type: Oral Tube size: 7.5 mm Number of attempts: 1 Airway Equipment and Method: Stylet Placement Confirmation: ETT inserted through vocal cords under direct vision, positive ETCO2 and breath sounds checked- equal and bilateral Secured at: 21 cm Tube secured with: Tape Dental Injury: Teeth and Oropharynx as per pre-operative assessment

## 2022-02-22 NOTE — Discharge Instructions (Signed)
HERNIA REPAIR: POST OP INSTRUCTIONS  ######################################################################  EAT Gradually transition to a high fiber diet with a fiber supplement over the next few weeks after discharge.  Start with a pureed / full liquid diet (see below)  WALK Walk an hour a day.  Control your pain to do that.    CONTROL PAIN Control pain so that you can walk, sleep, tolerate sneezing/coughing, and go up/down stairs.  HAVE A BOWEL MOVEMENT DAILY Keep your bowels regular to avoid problems.  OK to try a laxative to override constipation.  OK to use an antidairrheal to slow down diarrhea.  Call if not better after 2 tries  CALL IF YOU HAVE PROBLEMS/CONCERNS Call if you are still struggling despite following these instructions. Call if you have concerns not answered by these instructions  ######################################################################    DIET: Follow a light bland diet & liquids the first 24 hours after arrival home, such as soup, liquids, starches, etc.  Be sure to drink plenty of fluids.  Quickly advance to a usual solid diet within a few days.  Avoid fast food or heavy meals as your are more likely to get nauseated or have irregular bowels.  A low-fat, high-fiber diet for the rest of your life is ideal.   Take your usually prescribed home medications unless otherwise directed.  PAIN CONTROL: Pain is best controlled by a usual combination of three different methods TOGETHER: Ice/Heat Over the counter pain medication Prescription pain medication Most patients will experience some swelling and bruising around the hernia(s) such as the bellybutton, groins, or old incisions.  Ice packs or heating pads (30-60 minutes up to 6 times a day) will help. Use ice for the first few days to help decrease swelling and bruising, then switch to heat to help relax tight/sore spots and speed recovery.  Some people prefer to use ice alone, heat alone, alternating  between ice & heat.  Experiment to what works for you.  Swelling and bruising can take several weeks to resolve.   It is helpful to take an over-the-counter pain medication regularly for the first few weeks.  Choose one of the following that works best for you: Naproxen (Aleve, etc)  Two 244m tabs twice a day Ibuprofen (Advil, etc) Three 2045mtabs four times a day (every meal & bedtime) Acetaminophen (Tylenol, etc) 325-65043mour times a day (every meal & bedtime) A  prescription for pain medication should be given to you upon discharge.  Take your pain medication as prescribed.  If you are having problems/concerns with the prescription medicine (does not control pain, nausea, vomiting, rash, itching, etc), please call us [phone] see if we need to switch you to a different pain medicine that will work better for you and/or control your side effect better. If you need a refill on your pain medication, please contact your pharmacy.  They will contact our office to request authorization. Prescriptions will not be filled after 5 pm or on week-ends.  Avoid getting constipated.  Between the surgery and the pain medications, it is common to experience some constipation.  Increasing fluid intake and taking a fiber supplement (such as Metamucil, Citrucel, FiberCon, MiraLax, etc) 1-2 times a day regularly will usually help prevent this problem from occurring.  A mild laxative (prune juice, Milk of Magnesia, MiraLax, etc) should be taken according to package directions if there are no bowel movements after 48 hours.    Wash / shower every day.  You may shower over the dressings  as they are waterproof.    It is good for closed incisions and even open wounds to be washed every day.  Shower every day.  Short baths are fine.  Wash the incisions and wounds clean with soap & water.    You may leave closed incisions open to air if it is dry.   You may cover the incision with clean gauze & replace it after  your daily shower for comfort.  {Blank single:[id number]::"STERISTRIPS:  You have skin tapes called Steristrips on your incisions.  Leave them in place, and they will fall off on their own like a scab in 1-3 weeks.  You may trim any edges that curl up with clean scissors.  You may remove them in the shower in a week.","STAPLES: You have skin staples.  Leave them in place & set up an appointment for them to be removed by a surgery office nurse ~10 days after surgery.","DRAIN:  You have a drain in place.  Every day change the dressing in the shower, wash around the skin exit site with soap & water and place a new dressing of gauze or band aid around the skin every day.  Keep the drain site clean & dry.  Follow up in our surgery office to discuss plans for drain removal in the near future","DERMABOND:  You have purple skin glue (Dermabond) on your incision(s).  Leave them in place, and they will fall off on their own like a scab in 2-3 weeks.  You may trim any edges that curl up with clean scissors.","TEGADERM & WICKS:  You have clear gauze band-aid dressings over your closed incision(s).  Remove the dressings & shoelace ribbon wicks in your largest incision 3 days after surgery.","TEGADERM & STERISTRIPS:  You have clear gauze band-aid dressings over your closed incision(s).  You also have skin tapes called Steristrips on your incisions.  Leave these in place.  You may trim any edges that curl up with clean scissors.  You may remove all dressings & tapes in the shower in a week.","TEGADERM:  You have clear gauze band-aid dressings over your closed incision(s).  Remove the dressings 3 days after surgery."}    ACTIVITIES as tolerated:   You may resume regular (light) daily activities beginning the next day--such as daily self-care, walking, climbing stairs--gradually increasing activities as tolerated.  Control your pain so that you can walk an hour a day.  If you can walk 30 minutes without difficulty, it is safe to  try more intense activity such as jogging, treadmill, bicycling, low-impact aerobics, swimming, etc. Save the most intensive and strenuous activity for last such as sit-ups, heavy lifting, contact sports, etc  Refrain from any heavy lifting or straining until you are off narcotics for pain control.   DO NOT PUSH THROUGH PAIN.  Let pain be your guide: If it hurts to do something, don't do it.  Pain is your body warning you to avoid that activity for another week until the pain goes down. You may drive when you are no longer taking prescription pain medication, you can comfortably wear a seatbelt, and you can safely maneuver your car and apply brakes. You may have sexual intercourse when it is comfortable.   FOLLOW UP in our office Please call [facility] at [phone] to set up an appointment to see your surgeon in the office for a follow-up appointment approximately 2-3 weeks after your surgery. Make sure that you call for this appointment the day you arrive home to insure  a convenient appointment time.  9.  If you have disability of FMLA / Family leave forms, please bring the forms to the office for processing.  (do not give to your surgeon).  WHEN TO CALL us [phone]: Poor pain control Reactions / problems with new medications (rash/itching, nausea, etc)  Fever over 101.5 F (38.5 C) Inability to urinate Nausea and/or vomiting Worsening swelling or bruising Continued bleeding from incision. Increased pain, redness, or drainage from the incision   The clinic staff is available to answer your questions during regular business hours (8:30am-5pm).  Please don't hesitate to call and ask to speak to one of our nurses for clinical concerns.   If you have a medical emergency, go to the nearest emergency room or call 911.  A surgeon from [facility] is always on call at the hospitals in [facility], [place], [place], [place], [place]   [address] ?  [address] [address], [place], [place]   [address] MAIN: [phone] ? TOLL FREE: [phone] ? FAX: [phone] [email or web address]

## 2022-02-22 NOTE — Transfer of Care (Signed)
Immediate Anesthesia Transfer of Care Note  Patient: Cesar Herrera  Procedure(s) Performed: LAPAROSCOPIC VENTRAL WALL HERNIA REPAIR WITH MESH LAPAROSCOPIC BILATERAL INGUINAL HERNIA REPAIR WITH MESH, BILATERAL FEMORAL HERNIA REPAIR, LYSIS OF ADHESIONS, AND BILATERAL TAP BLOCK (Bilateral)  Patient Location: PACU  Anesthesia Type:General  Level of Consciousness: awake, alert  and oriented  Airway & Oxygen Therapy: Patient Spontanous Breathing and Patient connected to face mask oxygen  Post-op Assessment: Report given to RN and Post -op Vital signs reviewed and stable  Post vital signs: Reviewed and stable  Last Vitals:  Vitals Value Taken Time  BP 133/79 02/22/22 1045  Temp    Pulse 76 02/22/22 1049  Resp 24 02/22/22 1049  SpO2 99 % 02/22/22 1049  Vitals shown include unvalidated device data.  Last Pain:  Vitals:   02/22/22 7517  TempSrc: Oral  PainSc:          Complications: No notable events documented.

## 2022-02-22 NOTE — Anesthesia Postprocedure Evaluation (Signed)
Anesthesia Post Note  Patient: Cesar Herrera  Procedure(s) Performed: LAPAROSCOPIC VENTRAL WALL HERNIA REPAIR WITH MESH LAPAROSCOPIC BILATERAL INGUINAL HERNIA REPAIR WITH MESH, BILATERAL FEMORAL HERNIA REPAIR, LYSIS OF ADHESIONS, AND BILATERAL TAP BLOCK (Bilateral)     Patient location during evaluation: PACU Anesthesia Type: General Level of consciousness: awake and alert Pain management: pain level controlled Vital Signs Assessment: post-procedure vital signs reviewed and stable Respiratory status: spontaneous breathing, nonlabored ventilation, respiratory function stable and patient connected to nasal cannula oxygen Cardiovascular status: blood pressure returned to baseline and stable Postop Assessment: no apparent nausea or vomiting Anesthetic complications: no   No notable events documented.  Last Vitals:  Vitals:   02/22/22 1200 02/22/22 1215  BP: 120/77 117/78  Pulse: 66 62  Resp: 14 12  Temp:  (!) 36.4 C  SpO2: 100% 96%    Last Pain:  Vitals:   02/22/22 1215  TempSrc:   PainSc: 4                  Cherilynn Schomburg L Cathrine Krizan

## 2022-02-22 NOTE — H&P (Signed)
02/22/2022   REFERRING PHYSICIAN: Self  Patient Care Team: [name], MD as PCP - General (Internal Medicine) Monico Blitz, MD as Consulting Provider (General Surgery)  PROVIDER: Hollace Kinnier, MD  [facility] MRN: [id number] DOB: [birth year] DATE OF ENCOUNTER: 11/20/2021  SUBJECTIVE   Chief Complaint: new problem (New problem hernia at ostomy site )   History of Present Illness: Cesar Herrera is a 51 y.o. male who is seen today  as an office consultation at the request of Dr. Pasty Arch  for evaluation of new problem (New problem hernia at ostomy site ) .   Pleasant male diagnosed with rectal cancer. Underwent neoadjuvant chemoradiation therapy with low anterior resection & protective loop ileostomy Summer 2022. Complete pathological response with no residual lymph nodes. No post adjuvant chemotherapy recommended. Near then diagnostic laparoscopy to prove no cancer recurrence/carcinomatosis and subsequent ileostomy takedown 08/03/2021. Bulging noted at his old ileostomy site in the right side of the abdomen. CT scan confirms hernia. Consultation recommended for hernia repair. Patient comes in with his wife. He notes the bulging is may be slightly larger. He has had 1 or 2 episodes of twinges of pain and discomfort but otherwise does not bother him. He is controlling his bowels with Metamucil and Imodium. He is moving his bowels once or twice a day. Much less urgency. Doing some pelvic floor physical therapy. He does not smoke. No diabetes. No sleep apnea. He is not on blood thinners. He can walk at least 30 minutes without difficulty. Otherwise rather active. He works in [employer]. He is working to try and get his life back since he has had numerous surgeries for his cancer.  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 No pertinent surgical history.   Allergies  Allergen Reactions   Erythromycin Other (See Comments)  ? Reaction as child ? Reaction as child   Penicillins Hives  ? Reaction as child   Current Outpatient Medications on File Prior to Visit  Medication Sig Dispense Refill   capecitabine (XELODA) 500 MG tablet Take by mouth   loperamide (IMODIUM) 2 mg capsule Take by mouth   No current facility-administered medications on file prior to visit.   Family History  Problem Relation Age of Onset   Diabetes Father    Social History   Tobacco Use  Smoking Status Never  Smokeless Tobacco Never    Social History   Socioeconomic History   Marital status: Married  Tobacco Use   Smoking status: Never   Smokeless tobacco: Never  Vaping Use   Vaping Use: Never used  Substance and Sexual Activity   Alcohol use: Yes   Drug use: Never   Sexual activity: Defer   ############################################################  Review of Systems: A complete review of systems (ROS) was obtained from the patient. I have reviewed this information and discussed as appropriate with the patient. See HPI as well for other pertinent ROS.  Constitutional: No fevers, chills, sweats. Weight stable Eyes: No vision changes, No discharge HENT: No sore throats, nasal  drainage Lymph: No neck swelling, No bruising easily Pulmonary: No cough, productive sputum CV: No orthopnea, PND Patient walks 30 minutes for about 2 miles without difficulty. No exertional chest/neck/shoulder/arm pain.  GI: Rectal cancer survivor. No personal nor family history of inflammatory bowel disease, irritable bowel syndrome, allergy such as Celiac Sprue, dietary/dairy  problems, colitis, ulcers nor gastritis. No recent sick contacts/gastroenteritis. No travel outside the country. No changes in diet.  Renal: No UTIs, No hematuria Genital: No drainage, bleeding, masses Musculoskeletal: No severe joint pain. Good ROM major joints Skin: No sores or lesions Heme/Lymph: No easy bleeding. No swollen lymph nodes  OBJECTIVE   There were no vitals filed for this visit.   There is no height or weight on file to calculate BMI.  PHYSICAL EXAM:  Constitutional: Not cachectic. Hygeine adequate. Vitals signs as above.  Eyes: Pupils reactive, normal extraocular movements. Sclera nonicteric Neuro: CN II-XII intact. No major focal sensory defects. No major motor deficits. Lymph: No head/neck/groin lymphadenopathy Psych: No severe agitation. No severe anxiety. Judgment & insight Adequate, Oriented x4, HENT: Normocephalic, Mucus membranes moist. No thrush. Hearing: adequate Neck: Supple, No tracheal deviation. No obvious thyromegaly Chest: No pain to chest wall compression. Good respiratory excursion. No audible wheezing CV: Pulses intact. regular. No major extremity edema Ext: No obvious deformity or contracture. Edema: Not present. No cyanosis Skin: No major subcutaneous nodules. Warm and dry Musculoskeletal: Severe joint rigidity not present. No obvious clubbing. No digital petechiae. Mobility: no assist device moving easily without restrictions  Abdomen: Flat Soft. Nondistended. Nontender. Hernia: Present at: Right mid abdomen at old ileostomy site. 6 x 5 cm region. Reduces down to smaller fascial defect., size 6x5cm. Diastasis recti: Not present. No hepatomegaly. No splenomegaly.  Genital/Pelvic: Inguinal hernia: Present at: Right groin. More prominent with cough and Valsalva. No obvious left inguinal hernia.. Inguinal lymph nodes: without lymphadenopathy nor hidradenitis.   Rectal:  (Deferred)    ###################################################################  Labs, Imaging and Diagnostic Testing:  Located in 'Care Everywhere' section of Epic EMR chart  PRIOR [facility] CLINIC NOTES:  Located in [place]' section of Epic EMR chart  SURGERY NOTES:  Located in [facility]' section of Epic EMR chart  PATHOLOGY:  Located in [place]' section of Epic EMR chart  Assessment and Plan:  DIAGNOSES:  Diagnoses and all orders for this visit:  Incisional hernia, without obstruction or gangrene  Right inguinal hernia  Malignant neoplasm of rectum (CMS-HCC)    ASSESSMENT/PLAN  Patient with obvious bulging at ileostomy site consistent with incisional hernia. CT scan shows omentum within it. Small but definite right inguinal hernia as well.  I think he would benefit from surgical repair. I would lean towards consider trying to fix all hernias in the abdominal pelvic compartment to get them under control. Suspect this will take 3 hours. Probably start with inguinal hernia and then the periumbilical incisional hernia. Could be outpatient surgery versus overnight. We will see. He was a little thrown for a loop that I could diagnose and see multiple hernias on exam and CAT scan. However he does agree that it is best to try and get all the hernias fixed. I noted it would take 2-3 weeks before considering light duty work which she does in [employer] and then 6 weeks before considering unrestricted. Is not having severe symptoms. Trying to coordinate when he can get vacation time in the summer or try and get this done before it. The anatomy & physiology of the abdominal wall was discussed.  The pathophysiology of hernias was discussed. Natural history risks without surgery including progeressive enlargement, pain, incarceration, & strangulation was discussed. Contributors to complications such as smoking, obesity, diabetes, prior surgery, etc were discussed.   I  feel the risks of no intervention will lead to serious problems that outweigh the operative risks; therefore, I recommended surgery to reduce and repair the hernia. I explained laparoscopic techniques with possible need for an open approach. I noted the probable use of mesh to patch and/or buttress the hernia repair  Risks such as bleeding, infection, abscess, need for further treatment, injury to other organs, need for repair of tissues / organs, stroke, heart attack, death, and other risks were discussed. I noted a good likelihood this will help address the problem. Goals of post-operative recovery were discussed as well. Possibility that this will not correct all symptoms was explained. I stressed the importance of low-impact activity, aggressive pain control, avoiding constipation, & not pushing through pain to minimize risk of post-operative chronic pain or injury. Possibility of reherniation especially with smoking, obesity, diabetes, immunosuppression, and other health conditions was discussed. We will work to minimize complications.   An educational handout further explaining the pathology & treatment options was given as well. Questions were answered. The patient expresses understanding & wishes to proceed with surgery.    Adin Hector, MD, FACS, MASCRS Esophageal, Gastrointestinal & Colorectal Surgery Robotic and Minimally Invasive Surgery  [facility], [place]  [place]. [address], [place], [place] [address] [phone] Fax [phone] Main  CONTACT INFORMATION:  Weekday (9AM-5PM): Call [facility] main office at [phone]  Weeknight (5PM-9AM) or Weekend/Holiday: Check [email or web address] (password " [email or web address]") for General Surgery [facility] coverage  (Please, do not use SecureChat as it is not reliable communication to operating surgeons for immediate patient care)    02/22/2022

## 2022-02-22 NOTE — Op Note (Signed)
02/22/2022  PATIENT:  Cesar Herrera  51 y.o. male  Patient Care Team: [name], MD as PCP - General (Internal Medicine) [name], NP as Nurse Practitioner (Nurse Practitioner) [name], MD as Consulting Physician (Oncology) Michael Boston, MD as Consulting Physician (General Surgery)  PRE-OPERATIVE DIAGNOSIS:  VENTRAL INCISIONAL ABDOMINAL WALL HERNIA,  RIGHT INGUINAL HERNIA  POST-OPERATIVE DIAGNOSIS:   VENTRAL INCISIONAL ABDOMINAL WALL HERNIA BILATERAL INGUINAL HERNIAS BILATERAL FEMORAL HERNIAS  PROCEDURE:   LAPAROSCOPIC REPAIR OF  INCISIONAL INCARCERATED ABDOMINAL WALL HERNIA WITH MESH LAPAROSCOPIC BILATERAL INGUINAL HERNIA REPAIRS WITH MESH LAPAROSCOPIC BILATERAL FEMORAL HERNIA REPAIRS WITH MESH LAPAROSCOPIC LYSIS OF ADHESIONS TAP BLOCK - BILATERAL  SURGEON:  Adin Hector, MD  ASSISTANT: Mohammed Kindle, RNFA   ANESTHESIA:     General  Regional TRANSVERSUS ABDOMINIS PLANE (TAP) nerve block for perioperative & postoperative pain control provided with liposomal bupivacaine (Experel) mixed with 0.25% bupivacaine as a Bilateral TAP block x 71m each side at the level of the transverse abdominis & preperitoneal spaces along the flank at the anterior axillary line, from subcostal ridge to iliac crest under laparoscopic guidance   EBL:  Total I/O In: 1660 [I.V.:1500; IV Piggyback:160] Out: 100 [Blood:100]  Per anesthesia record  Delay start of Pharmacological VTE agent (>24hrs) due to surgical blood loss or risk of bleeding:  no  DRAINS: none   SPECIMEN:  No Specimen  DISPOSITION OF SPECIMEN:  N/A  COUNTS:  YES  PLAN OF CARE: Admit for overnight observation  PATIENT DISPOSITION:  PACU - hemodynamically stable.  INDICATION: Pleasant patient has developed a ventral wall abdominal hernia. Recommendation was made for surgical repair  The anatomy & physiology of the abdominal wall was discussed. The pathophysiology of hernias was discussed. Natural history  risks without surgery including progeressive enlargement, pain, incarceration & strangulation was discussed. Contributors to complications such as smoking, obesity, diabetes, prior surgery, etc were discussed.  I feel the risks of no intervention will lead to serious problems that outweigh the operative risks; therefore, I recommended surgery to reduce and repair the hernia. I explained laparoscopic techniques with possible need for an open approach. I noted the probable use of mesh to patch and/or buttress the hernia repair.  Risks such as bleeding, infection, abscess, need for further treatment, heart attack, death, and other risks were discussed. I noted a good likelihood this will help address the problem. Goals of post-operative recovery were discussed as well. Possibility that this will not correct all symptoms was explained. I stressed the importance of low-impact activity, aggressive pain control, avoiding constipation, & not pushing through pain to minimize risk of post-operative chronic pain or injury. Possibility of reherniation especially with smoking, obesity, diabetes, immunosuppression, and other health conditions was discussed. We will work to minimize complications.   An educational handout further explaining the pathology & treatment options was given as well. Questions were answered. The patient expresses understanding & wishes to proceed with surgery.   OR FINDINGS: Moderate sized indirect inguinal hernia on the right side with small but definite femoral hernia.  No direct space nor obturator hernias.  On the left groin, more subtle indirect and femoral hernias but definite.  No direct space or obturator hernia.  Right mid abdomen with incisional hernia chronically incarcerated with greater omentum only partially reducible.  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  7 x 5 cm region  Type of ventral wall repair: Laparoscopic underlay repair.  Primary repair of largest hernia Placement of mesh:  Centrally intraperitoneal with  edges tucked into RECTRORECTUS & preperitoneal space Name of mesh: Bard Ventralight dual sided (polypropylene / Seprafilm) Size of mesh: 25x20cm Orientation: Transverse Mesh overlap:  5-7cm   DESCRIPTION:    The patient was identified & brought into the operating room. The patient was positioned supine with arms tucked. SCDs were active during the entire case. The patient underwent general anesthesia without any difficulty.  The abdomen was prepped and draped in a sterile fashion. The patient's bladder was emptied.  A Surgical Timeout confirmed our plan.  Started focusing on the groin hernias.  I made a transverse incision through the inferior umbilical fold.  I made a small transverse nick through the anterior rectus fascia contralateral to the inguinal hernia side and placed a 0-vicryl stitch through the fascia.  I placed a Hasson trocar into the preperitoneal plane.  Entry was clean.  We induced carbon dioxide insufflation. Camera inspection revealed no injury.  I used a 65m angled scope to bluntly free the peritoneum off the infraumbilical anterior abdominal wall.  I created enough of a preperitoneal pocket to place 579mports into the right & left mid-abdomen into this preperitoneal cavity.  I focused attention on the RIGHT pelvis since that was the dominant hernia side.   I used blunt & focused sharp dissection to free the peritoneum off the flank and down to the pubic rim.  I freed the anteriolateral bladder wall off the anteriolateral pelvic wall, sparing midline attachments.   I located a swath of peritoneum going into a hernia fascial defect at the  internal ring consistent with  an indirect inguinal hernia..  I gradually freed the peritoneal hernia sac off safely and reduced it into the preperitoneal space.  I freed the peritoneum off the spermatic vessels & vas deferens.  I freed peritoneum off the retroperitoneum along the psoas muscle.  Of note patient had  an obvious femoral hernia as well.  Spermatic cord lipoma was dissected away & removed.  I checked & assured hemostasis.     I turned attention on the opposite  LEFT pelvis.  I did dissection in a similar, mirror-image fashion. The patient had an indirect inguinal hernia.   More subtle but definite femoral hernia as well.  Spermatic cord lipoma was dissected away & removed.    I checked & assured hemostasis.     I chose sheets of medium-weight polypropylene Bard Marlex 15x15cm on the right side since it was the largest area due to mesh availability.  For the left side I used a Bard soft ultralight weight polypropylene mesh.  15 x 15 cm.  I cut a single sigmoid-shaped slit ~6cm from a corner of each mesh.  I placed the meshes into the preperitoneal space & laid them as overlapping diamonds such that at the inferior points, a 6x6 cm corner flap rested in the true anterolateral pelvis, covering the obturator & femoral foramina.   I allowed the bladder to return to the pubis, this helping tuck the corners of the mesh in the anteriolateral pelvis.  The medial corners overlapped each other across midline cephalad to the pubic rim.   Given the numerous hernias of moderate size, I placed a third 15x15cm mesh in the center as a vertical diamond.  The lateral wings of the mesh overlap across the direct spaces and internal rings where the dominant hernias were.  This provided good coverage and reinforcement of the hernia repairs.  Because of the central mesh placement with good overlap, I did not  place any tacks.    We then turned attention to the ventral wall hernia.  Abdominal entry was gained using optical entry technique in the left upper abdomen. Entry was clean. I induced carbon dioxide insufflation. Camera inspection revealed no injury. Extra ports were carefully placed under direct laparoscopic visualization.   I could see adhesions on the parietal peritoneum under the abdominal wall.  I did laparoscopic lysis  of adhesions to expose the entire anterior abdominal wall.  I primarily used focused sharp dissection.  I freed off the falciform ligament and central peritoneum to expose the retrorectus fascia   I made sure hemostasis was good.  I mapped out the region using a needle passer.   To ensure that I would have at least 5 cm radial coverage outside of the hernia defect, I chose a 25x20cm dual sided mesh.  I placed #1 Prolene stitches around its edge about every 5 cm = 12 total.  I rolled the mesh & placed into the peritoneal cavity through the hernia defect.  I unrolled the mesh and positioned it appropriately.  I secured the mesh to cover up the hernia defect using a laparoscopic suture passer to pass the tails of the Prolene through the abdominal wall & tagged them with clamps for good transfascial suturing.  I started out in four corners to make sure I had the mesh centered under the hernia defect appropriately, and then proceeded to work in quadrants.    We evacuated CO2 & desufflated the abdomen.  I tied the fascial stitches down. I closed the fascial defect that I placed the mesh through using #1 PDS interrupted transverse stitches primarily.  I reinsufflated the abdomen. The mesh provided at least circumferential coverage around the entire region of hernia defects.  I secured the mesh centrally with an additional trans fascial stitch in & out the mesh using #1 PDS under laparoscopic visualization.   I tacked the edges & central part of the mesh to the peritoneum/posterior rectus fascia with SecureStrap absorbable tacks.   I did reinspection. Hemostasis was good. Mesh laid well. I completed a broad field block of local anesthesia at fascial stitch sites & fascial closure areas.    Capnoperitoneum was evacuated. Ports were removed.  I excised the old ileostomy scar and removed some remaining omentum in the subcutaneous tissues to have a smaller hernia sac region.  I closed this wound transversely using 2-0  Vicryl deep dermal sutures and 4 Monocryl suture.  The skin was closed with Monocryl at the port sites and Steri-Strips on the fascial stitch puncture sites.  Patient is being extubated to go to the recovery room.  I discussed operative findings, updated the patient's status, discussed probable steps to recovery, and gave postoperative recommendations to the patient's spouse, Keyandre Pileggi.  Recommendations were made.  Questions were answered.  She expressed understanding & appreciation.  Adin Hector, M.D., F.A.C.S. Gastrointestinal and Minimally Invasive Surgery [facility] [address] [place], [place] [address] [phone] Main / Paging  02/22/2022 10:49 AM

## 2022-02-23 ENCOUNTER — Encounter (HOSPITAL_COMMUNITY): Payer: Self-pay | Admitting: Surgery

## 2022-02-23 DIAGNOSIS — Z87891 Personal history of nicotine dependence: Secondary | ICD-10-CM | POA: Diagnosis not present

## 2022-02-23 DIAGNOSIS — Z85048 Personal history of other malignant neoplasm of rectum, rectosigmoid junction, and anus: Secondary | ICD-10-CM | POA: Diagnosis not present

## 2022-02-23 DIAGNOSIS — K4120 Bilateral femoral hernia, without obstruction or gangrene, not specified as recurrent: Secondary | ICD-10-CM | POA: Diagnosis not present

## 2022-02-23 DIAGNOSIS — K4020 Bilateral inguinal hernia, without obstruction or gangrene, not specified as recurrent: Secondary | ICD-10-CM | POA: Diagnosis not present

## 2022-02-23 DIAGNOSIS — K430 Incisional hernia with obstruction, without gangrene: Secondary | ICD-10-CM | POA: Diagnosis not present

## 2022-02-23 NOTE — Discharge Summary (Signed)
Physician Discharge Summary    Patient ID: Cesar Herrera MRN: [id number] DOB/AGE: [birth year]  51 y.o.  Patient Care Team: Pincus Sanes, MD as PCP - General (Internal Medicine) Pollyann Samples, NP as Nurse Practitioner (Nurse Practitioner) Malachy Mood, MD as Consulting Physician (Oncology) Karie Soda, MD as Consulting Physician (General Surgery)  Admit date: 02/22/2022  Discharge date: 02/23/2022  Hospital Stay = 0 days    Discharge Diagnoses:  Principal Problem:   Incisional hernia   1 Day Post-Op  02/22/2022  POST-OPERATIVE DIAGNOSIS:   VENTRAL INCISIONAL ABDOMINAL WALL HERNIA,  BILATERAL INGUINAL HERNIAS, BILATERAL FEMORAL HERNIAS  SURGERY:  02/22/2022  Procedure(s): LAPAROSCOPIC VENTRAL WALL HERNIA REPAIR WITH MESH LAPAROSCOPIC BILATERAL INGUINAL HERNIA REPAIR WITH MESH, BILATERAL FEMORAL HERNIA REPAIR, LYSIS OF ADHESIONS, AND BILATERAL TAP BLOCK  SURGEON:    Surgeon(s): Karie Soda, MD  Consults: [facility] Course:   The patient underwent  the surgery above.  Postoperatively, the patient gradually mobilized and advanced to a solid diet.  Pain and other symptoms were treated aggressively.    By the time of discharge, the patient was walking well the hallways, eating food, having flatus.  Pain was well-controlled on an oral medications.  Based on meeting discharge criteria and continuing to recover, I felt it was safe for the patient to be discharged from the hospital to further recover with close followup. Postoperative recommendations were discussed in detail.  They are written as well.  Discharged Condition: good  Discharge Exam: Blood pressure 113/80, pulse 74, temperature 97.9 F (36.6 C), temperature source Oral, resp. rate 18, height 5\' 8"  (1.727 m), weight 80.3 kg, SpO2 97 %.  General: Pt awake/alert/oriented x4 in No acute distress Eyes: PERRL, normal EOM.  Sclera clear.  No icterus Neuro: CN II-XII intact w/o focal sensory/motor  deficits. Lymph: No head/neck/groin lymphadenopathy Psych:  No delerium/psychosis/paranoia HENT: Normocephalic, Mucus membranes moist.  No thrush Neck: Supple, No tracheal deviation Chest:  No chest wall pain w good excursion CV:  Pulses intact.  Regular rhythm MS: Normal AROM mjr joints.  No obvious deformity Abdomen: Soft.  Mildly distended.  Mildly tender at incisions only.  No evidence of peritonitis.  No incarcerated hernias. Ext:  SCDs BLE.  No mjr edema.  No cyanosis Skin: No petechiae / purpura   Disposition:    Follow-up Information     Karie Soda, MD Follow up in 3 week(s).   Specialties: General Surgery, Colon and Rectal Surgery Why: To follow up after your operation Contact information: [address] [place] [address] [phone]                 Discharge disposition: 01-Home or Self Care       Discharge Instructions     Call MD for:   Complete by: As directed    FEVER >101.5 F (Temperatures <101.81F occasionally happen and are not significant)   Call MD for:  extreme fatigue   Complete by: As directed    Call MD for:  persistant dizziness or light-headedness   Complete by: As directed    Call MD for:  persistant nausea and vomiting   Complete by: As directed    Call MD for:  redness, tenderness, or signs of infection (pain, swelling, redness, odor or green/yellow discharge around incision site)   Complete by: As directed    Call MD for:  severe uncontrolled pain   Complete by: As directed    Diet - low sodium heart  healthy   Complete by: As directed    Follow a light diet the first few days at home.  Start with a bland diet such as soups, liquids, starchy foods, low fat foods, etc.   If you feel full, bloated, or constipated, stay on a full liquid or pureed/blenderized diet for a few days until you feel better and no longer constipated. Gradually get back to a regular solid diet.  Avoid fast food or heavy meals the first  week as you are more likely to get nauseated.   Discharge instructions   Complete by: As directed    One the day of your discharge from the hospital (or the next business weekday), please call [facility] to set up or confirm an appointment to see your surgeon in the office for a follow-up appointment.  Usually it is 2-3 weeks after your surgery.  Other concerns If you are not getting better after two weeks or are noticing you are getting worse, contact our office [phone] for further advice.  We may need to adjust your medications, re-evaluate you in the office, send you to the emergency room, or see what other things we can do to help. The clinic staff is available to answer your questions during regular business hours (8:30am-5pm).  Please don't hesitate to call and ask to speak to one of our nurses for clinical concerns.    A surgeon from [facility] is always on call at the hospitals 24 hours/day If you have a medical emergency, go to the nearest emergency room or call 911.   Discharge wound care:   Complete by: As directed    It is good for closed incisions and even open wounds to be washed every day.  Shower every day.  Short baths are fine.  Wash the incisions and wounds clean with soap & water.    You may leave closed incisions open to air if it is dry.   You may cover the incision with clean gauze & replace it after your daily shower for comfort.  TEGADERM & STERISTRIPS:  You have clear gauze band-aid dressings over your closed incision(s).  You also have skin tapes called Steristrips on your incisions.  Leave these in place.  You may trim any edges that curl up with clean scissors.  You may remove all dressings & tapes in the shower in a week.   Driving Restrictions   Complete by: As directed    You may drive when you are no longer taking prescription pain medication, you can comfortably wear a seatbelt, and you can safely maneuver your car and apply  brakes.   Increase activity slowly   Complete by: As directed    Lifting restrictions   Complete by: As directed    You may resume regular (light) daily activities beginning the next day-such as daily self-care, walking, climbing stairs-gradually increasing activities as tolerated.   If you can walk 30 minutes without difficulty, it is safe to try more intense activity such as jogging, treadmill, bicycling, low-impact aerobics, swimming, etc. Save the most intensive and strenuous activity for last such as sit-ups, heavy lifting, contact sports, etc   Refrain from any heavy lifting or straining until you are off narcotics for pain control.   DO NOT PUSH THROUGH PAIN.   Let pain be your guide: If it hurts to do something, don't do it.   Pain is your body warning you to avoid that activity for another week until  the pain goes down.   May shower / Bathe   Complete by: As directed    Wash / shower every day.  You may shower over the dressings as they are waterproof.  Continue to shower over incision(s) after the dressing is off.   May walk up steps   Complete by: As directed    Sexual Activity Restrictions   Complete by: As directed    You may have sexual intercourse when it is comfortable. If it hurts to do something, stop.       Allergies as of 02/23/2022       Reactions   Erythromycin Other (See Comments)   Childhood reaction   Penicillins Other (See Comments)   Childhood reaction        Medication List     TAKE these medications    acetaminophen 500 MG tablet Commonly known as: TYLENOL Take 500-1,000 mg by mouth every 6 (six) hours as needed (for pain.).   ibuprofen 200 MG tablet Commonly known as: ADVIL Take 200-400 mg by mouth every 8 (eight) hours as needed (pain).   loperamide 2 MG capsule Commonly known as: IMODIUM Take 1 capsule (2 mg total) by mouth as needed for diarrhea or loose stools. Once abd bloating has improved   Lubricant Eye Drops 0.4-0.3 %  Soln Generic drug: Polyethyl Glycol-Propyl Glycol Place 1-2 drops into both eyes 3 (three) times daily as needed (burning/irritated/dry eyes.).   methocarbamol 750 MG tablet Commonly known as: ROBAXIN Take 1 tablet (750 mg total) by mouth 4 (four) times daily as needed (use for muscle cramps/pain).   multivitamin with minerals Tabs tablet Take 1 tablet by mouth 3 (three) times a week.   Na Sulfate-K Sulfate-Mg Sulf 17.5-3.13-1.6 GM/177ML Soln Take 1 kit by mouth as directed. May use generic SUPREP;NO prior authorizations will be done.Please use Singlecare or GOOD-RX coupon.   oxyCODONE 5 MG immediate release tablet Commonly known as: Oxy IR/ROXICODONE Take 1 tablet (5 mg total) by mouth every 6 (six) hours as needed for moderate pain, severe pain or breakthrough pain.   tadalafil 5 MG tablet Commonly known as: CIALIS Take 1-2 tablets (5-10 mg total) by mouth daily as needed for erectile dysfunction.               Discharge Care Instructions  (From admission, onward)           Start     Ordered   02/22/22 0000  Discharge wound care:       Comments: It is good for closed incisions and even open wounds to be washed every day.  Shower every day.  Short baths are fine.  Wash the incisions and wounds clean with soap & water.    You may leave closed incisions open to air if it is dry.   You may cover the incision with clean gauze & replace it after your daily shower for comfort.  TEGADERM & STERISTRIPS:  You have clear gauze band-aid dressings over your closed incision(s).  You also have skin tapes called Steristrips on your incisions.  Leave these in place.  You may trim any edges that curl up with clean scissors.  You may remove all dressings & tapes in the shower in a week.   02/22/22 0755            Significant Diagnostic Studies:  Results for orders placed or performed during the hospital encounter of 02/22/22 (from the past 72 hour(s))  CBC  Status: None    Collection Time: 02/22/22 12:58 PM  Result Value Ref Range   WBC 6.8 4.0 - 10.5 K/uL   RBC 4.66 4.22 - 5.81 MIL/uL   Hemoglobin 14.0 13.0 - 17.0 g/dL   HCT 78.4 69.6 - 29.5 %   MCV 90.1 80.0 - 100.0 fL   MCH 30.0 26.0 - 34.0 pg   MCHC 33.3 30.0 - 36.0 g/dL   RDW 28.4 13.2 - 44.0 %   Platelets 180 150 - 400 K/uL   nRBC 0.0 0.0 - 0.2 %    Comment: Performed at [facility], [address], [place], [place] [address]  Creatinine, serum     Status: None   Collection Time: 02/22/22 12:58 PM  Result Value Ref Range   Creatinine, Ser 1.05 0.61 - 1.24 mg/dL   GFR, Estimated >53 >66 mL/min    Comment: (NOTE) Calculated using the CKD-EPI Creatinine Equation (2021) Performed at [facility], [address] [place], [place] [address]     No results found.  Past Medical History:  Diagnosis Date   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   Family history of bladder cancer    Family history of lymphoma    H/O epididymitis 2011   PONV (postoperative nausea and vomiting)    Seasonal allergies     Past Surgical History:  Procedure Laterality Date   COLONOSCOPY  11/11/2020   ILEOSTOMY CLOSURE N/A 08/03/2021   Procedure: ILEOSTOMY CLOSURE;  Surgeon: Romie Levee, MD;  Location: [facility];  Service: General;  Laterality: N/A;   LAPAROSCOPY N/A 08/03/2021   Procedure: LAPAROSCOPY DIAGNOSTIC;  Surgeon: Romie Levee, MD;  Location: [facility];  Service: General;  Laterality: N/A;   VASECTOMY     WISDOM TOOTH EXTRACTION     XI ROBOTIC ASSISTED LOWER ANTERIOR RESECTION N/A 03/10/2021   Procedure: XI ROBOTIC ASSISTED LOWER ANTERIOR RESECTION WITH LOOP ILEOSTOMY;  Surgeon: Romie Levee, MD;  Location: [facility];  Service: General;  Laterality: N/A;    Social History   Socioeconomic History   Marital status: Married    Spouse name: Not on file   Number of children: Not on file   Years of education: Not on file   Highest education level: Not on file  Occupational History    Not on file  Tobacco Use   Smoking status: Former    Types: Cigarettes    Quit date: 07/29/2003    Years since quitting: 18.5   Smokeless tobacco: Never   Tobacco comments:    smoked 1993-2004, up to < 4 cigarettes/ day (socially)  Vaping Use   Vaping Use: Never used  Substance and Sexual Activity   Alcohol use: Yes    Alcohol/week: 6.0 standard drinks of alcohol    Types: 6 Standard drinks or equivalent per week   Drug use: No   Sexual activity: Yes    Birth control/protection: Surgical  Other Topics Concern   Not on file  Social History Narrative   Exercise: mountain biking, hiking, walking - tends to exercise in spurts, does some yoga   Social Determinants of Health   Financial Resource Strain: Not on file  Food Insecurity: Not on file  Transportation Needs: Not on file  Physical Activity: Not on file  Stress: Not on file  Social Connections: Not on file  Intimate Partner Violence: Not on file    Family History  Problem Relation Age of Onset   Diabetes Maternal Uncle    Diabetes Father  Colon polyps Father        1 polyp   Stroke Other        PG aunts & PG uncles   Heart disease Paternal Grandfather    Bladder Cancer Paternal Aunt    Lymphoma Nephew 15   Other Nephew 2       kidney transplant   Cancer Neg Hx    Colon cancer Neg Hx    Stomach cancer Neg Hx    Esophageal cancer Neg Hx     Current Facility-Administered Medications  Medication Dose Route Frequency Provider Last Rate Last Admin   0.9 %  sodium chloride infusion  1,000 mL Intravenous Q8H PRN Resean Brander, Viviann Spare, MD       0.9 %  sodium chloride infusion  250 mL Intravenous PRN Karie Soda, MD       acetaminophen (TYLENOL) tablet 1,000 mg  1,000 mg Oral Trecia Rogers, MD   1,000 mg at 02/23/22 0501   bisacodyl (DULCOLAX) suppository 10 mg  10 mg Rectal Daily PRN Karie Soda, MD       diphenhydrAMINE (BENADRYL) 12.5 MG/5ML elixir 12.5 mg  12.5 mg Oral Q6H PRN Karie Soda, MD       Or    diphenhydrAMINE (BENADRYL) injection 12.5 mg  12.5 mg Intravenous Q6H PRN Karie Soda, MD       enoxaparin (LOVENOX) injection 40 mg  40 mg Subcutaneous Q24H Karie Soda, MD       gabapentin (NEURONTIN) capsule 300 mg  300 mg Oral BID Karie Soda, MD   300 mg at 02/22/22 2147   HYDROmorphone (DILAUDID) injection 0.5-2 mg  0.5-2 mg Intravenous Q4H PRN Karie Soda, MD       lactated ringers bolus 1,000 mL  1,000 mL Intravenous Q8H PRN Jayme Mednick, Viviann Spare, MD       lip balm (CARMEX) ointment   Topical BID Karie Soda, MD   Given at 02/22/22 2147   magic mouthwash  15 mL Oral QID PRN Karie Soda, MD       magnesium hydroxide (MILK OF MAGNESIA) suspension 30 mL  30 mL Oral Daily PRN Karie Soda, MD       methocarbamol (ROBAXIN) 1,000 mg in dextrose 5 % 100 mL IVPB  1,000 mg Intravenous Q6H PRN Karie Soda, MD       methocarbamol (ROBAXIN) tablet 1,000 mg  1,000 mg Oral Q6H PRN Karie Soda, MD       metoprolol tartrate (LOPRESSOR) injection 5 mg  5 mg Intravenous Q6H PRN Karie Soda, MD       multivitamin with minerals tablet 1 tablet  1 tablet Oral Once per day on Mon Wed Fri Estalene Bergey, MD       ondansetron (ZOFRAN-ODT) disintegrating tablet 4 mg  4 mg Oral Q6H PRN Karie Soda, MD       Or   ondansetron [facility]) injection 4 mg  4 mg Intravenous Q6H PRN Karie Soda, MD       oxyCODONE (Oxy IR/ROXICODONE) immediate release tablet 5-10 mg  5-10 mg Oral Q4H PRN Karie Soda, MD   10 mg at 02/23/22 0501   polycarbophil (FIBERCON) tablet 625 mg  625 mg Oral BID Karie Soda, MD   625 mg at 02/22/22 2145   polyvinyl alcohol (LIQUIFILM TEARS) 1.4 % ophthalmic solution 1-2 drop  1-2 drop Both Eyes TID PRN Karie Soda, MD       prochlorperazine (COMPAZINE) tablet 10 mg  10 mg Oral Q6H PRN Karie Soda, MD  Or   prochlorperazine (COMPAZINE) injection 5-10 mg  5-10 mg Intravenous Q6H PRN Karie Soda, MD       simethicone (MYLICON) chewable tablet 40 mg  40 mg Oral Q6H PRN Karie Soda, MD       sodium chloride flush (NS) 0.9 % injection 3 mL  3 mL Intravenous Catha Gosselin, MD   3 mL at 02/22/22 2147   sodium chloride flush (NS) 0.9 % injection 3 mL  3 mL Intravenous PRN Karie Soda, MD       traMADol Janean Sark) tablet 50-100 mg  50-100 mg Oral Q6H PRN Karie Soda, MD         Allergies  Allergen Reactions   Erythromycin Other (See Comments)    Childhood reaction   Penicillins Other (See Comments)    Childhood reaction     Signed:   Ardeth Sportsman, MD, FACS, MASCRS Esophageal, Gastrointestinal & Colorectal Surgery Robotic and Minimally Invasive Surgery  [facility], [facility]  [facility]  [address], [address] [place], [place] [address] [phone] Fax [phone] Main  CONTACT INFORMATION:  Weekday (9AM-5PM): Call [facility] main office at [phone]  Weeknight (5PM-9AM) or Weekend/Holiday: Check [email or web address] (password " [email or web address]") for General Surgery [facility] coverage  (Please, do not use SecureChat as it is not reliable communication to operating surgeons for immediate patient care)      02/23/2022, 7:47 AM

## 2022-02-27 ENCOUNTER — Telehealth: Payer: Self-pay

## 2022-03-14 ENCOUNTER — Telehealth: Payer: Self-pay | Admitting: *Deleted

## 2022-03-14 NOTE — Telephone Encounter (Signed)
Dr Ardis Hughs,   This pt has a colon scheduled with you on 7-28 for a hx of rectal cancer- he had hernia surgery with Dr Johney Maine on 02-22-22 and followed with DR Gross 7-11. In the [place] note with Dr Johney Maine yesterday , he cleared pt for the colon. Please review and advise if okay to proceed with colonoscopy 03-30-22.  Thanks, Lelan Pons PV

## 2022-03-15 NOTE — Telephone Encounter (Signed)
Noted. Patient called left message ok to proceed with colon as scheduled per Dr.Jacobs.

## 2022-03-20 ENCOUNTER — Encounter: Payer: BC Managed Care – PPO | Admitting: Gastroenterology

## 2022-03-26 ENCOUNTER — Encounter: Payer: Self-pay | Admitting: Gastroenterology

## 2022-03-30 ENCOUNTER — Encounter: Payer: Self-pay | Admitting: Gastroenterology

## 2022-03-30 ENCOUNTER — Ambulatory Visit (AMBULATORY_SURGERY_CENTER): Payer: BC Managed Care – PPO | Admitting: Gastroenterology

## 2022-03-30 VITALS — BP 122/78 | HR 56 | Temp 97.8°F | Resp 11 | Ht 68.0 in | Wt 177.0 lb

## 2022-03-30 DIAGNOSIS — C20 Malignant neoplasm of rectum: Secondary | ICD-10-CM

## 2022-03-30 DIAGNOSIS — Z08 Encounter for follow-up examination after completed treatment for malignant neoplasm: Secondary | ICD-10-CM

## 2022-03-30 DIAGNOSIS — Z85048 Personal history of other malignant neoplasm of rectum, rectosigmoid junction, and anus: Secondary | ICD-10-CM

## 2022-03-30 DIAGNOSIS — Z85038 Personal history of other malignant neoplasm of large intestine: Secondary | ICD-10-CM | POA: Diagnosis not present

## 2022-03-30 MED ORDER — SODIUM CHLORIDE 0.9 % IV SOLN: 500.0000 mL | Freq: Once | INTRAVENOUS | Status: DC

## 2022-03-30 NOTE — Progress Notes (Signed)
Pt's states no medical or surgical changes since previsit or office visit.   Patient had hernia surgery on June 22 - repaired 5 hernias.

## 2022-03-30 NOTE — Progress Notes (Signed)
To pacu, VSS. Report to [email or web address] 

## 2022-03-30 NOTE — Op Note (Signed)
[facility] Patient Name: Cesar Herrera Procedure Date: 03/30/2022 8:25 AM MRN: [id number] Endoscopist: Milus Banister , MD Age: 51 Referring MD:  Date of Birth: 23-Apr-1971 Gender: Male Account #: [id number] Procedure:                Colonoscopy Indications:              High risk colon cancer surveillance: Personal                            history of colon cancer; T1N0M0 rectal                            adenocarcinoma, diagnosed during colonoscopy 11/2020                            Dr. Ardis Hughs (also three subCM TAs removed).                            Underwent neoadjuvant chemo/XRT and then surgical                            resection with temporary ileostomy 03/2022 Dr.                            [name]. Adjuvant xeloda. Ileostomy reversal 08/2021 Medicines:                Monitored Anesthesia Care Procedure:                Pre-Anesthesia Assessment:                           - Prior to the procedure, a History and Physical                            was performed, and patient medications and                            allergies were reviewed. The patient's tolerance of                            previous anesthesia was also reviewed. The risks                            and benefits of the procedure and the sedation                            options and risks were discussed with the patient.                            All questions were answered, and informed consent                            was obtained. Prior Anticoagulants: The patient has  taken no previous anticoagulant or antiplatelet                            agents. ASA Grade Assessment: II - A patient with                            mild systemic disease. After reviewing the risks                            and benefits, the patient was deemed in                            satisfactory condition to undergo the procedure.                           After obtaining informed consent,  the colonoscope                            was passed under direct vision. Throughout the                            procedure, the patient's blood pressure, pulse, and                            oxygen saturations were monitored continuously. The                            Olympus CF-HQ190L [id number]) Colonoscope was                            introduced through the anus and advanced to the the                            cecum, identified by appendiceal orifice and                            ileocecal valve. The colonoscopy was performed                            without difficulty. The patient tolerated the                            procedure well. The quality of the bowel                            preparation was good. The ileocecal valve,                            appendiceal orifice, and rectum were photographed. Scope In: 8:36:16 AM Scope Out: 8:50:25 AM Scope Withdrawal Time: 0 hours 12 minutes 29 seconds  Total Procedure Duration: 0 hours 14 minutes 9 seconds  Findings:                 A few small-mouthed diverticula were found in the  left colon.                           Colocolonic anastomosis in distal rectum (3-4cm                            from anal verge) noted and it was normal. There                            were 3 retained stitches present that were not                            causing obvious irritation to the colon.                           The exam was otherwise without abnormality on                            direct and retroflexion views. Complications:            No immediate complications. Estimated blood loss:                            None. Estimated Blood Loss:     Estimated blood loss: none. Impression:               - Diverticulosis in the left colon.                           - Colocolonic anastomosis in distal rectum noted                            and it was normal. There were 3 retained stitches                             present that were not causing obvious irritation to                            the colon.                           - The examination was otherwise normal on direct                            and retroflexion views.                           - No polyp or cancers. Recommendation:           - Patient has a contact number available for                            emergencies. The signs and symptoms of potential                            delayed complications were discussed with the  patient. Return to normal activities tomorrow.                            Written discharge instructions were provided to the                            patient.                           - Resume previous diet.                           - Continue present medications.                           - Repeat colonoscopy in 3 years for surveillance. Milus Banister, MD 03/30/2022 8:54:35 AM This report has been signed electronically.

## 2022-03-30 NOTE — Patient Instructions (Signed)
Handout provided about diverticulosis.  Repeat colonoscopy in 3 years for surveillance.  YOU HAD AN ENDOSCOPIC PROCEDURE TODAY AT [facility]:   Refer to the procedure report that was given to you for any specific questions about what was found during the examination.  If the procedure report does not answer your questions, please call your gastroenterologist to clarify.  If you requested that your care partner not be given the details of your procedure findings, then the procedure report has been included in a sealed envelope for you to review at your convenience later.  YOU SHOULD EXPECT: Some feelings of bloating in the abdomen. Passage of more gas than usual.  Walking can help get rid of the air that was put into your GI tract during the procedure and reduce the bloating. If you had a lower endoscopy (such as a colonoscopy or flexible sigmoidoscopy) you may notice spotting of blood in your stool or on the toilet paper. If you underwent a bowel prep for your procedure, you may not have a normal bowel movement for a few days.  Please Note:  You might notice some irritation and congestion in your nose or some drainage.  This is from the oxygen used during your procedure.  There is no need for concern and it should clear up in a day or so.  SYMPTOMS TO REPORT IMMEDIATELY:  Following lower endoscopy (colonoscopy or flexible sigmoidoscopy):  Excessive amounts of blood in the stool  Significant tenderness or worsening of abdominal pains  Swelling of the abdomen that is new, acute  Fever of 100F or higher  For urgent or emergent issues, a gastroenterologist can be reached at any hour by calling [phone]. Do not use MyChart messaging for urgent concerns.    DIET:  We do recommend a small meal at first, but then you may proceed to your regular diet.  Drink plenty of fluids but you should avoid alcoholic beverages for 24 hours.  ACTIVITY:  You should plan to take it easy for  the rest of today and you should NOT DRIVE or use heavy machinery until tomorrow (because of the sedation medicines used during the test).    FOLLOW UP: Our staff will call the number listed on your records the next business day following your procedure.  We will call around 7:15- 8:00 am to check on you and address any questions or concerns that you may have regarding the information given to you following your procedure. If we do not reach you, we will leave a message.  If you develop any symptoms (ie: fever, flu-like symptoms, shortness of breath, cough etc.) before then, please call [phone].  If you test positive for Covid 19 in the 2 weeks post procedure, please call and report this information to [place].    If any biopsies were taken you will be contacted by phone or by letter within the next 1-3 weeks.  Please call us at [phone] if you have not heard about the biopsies in 3 weeks.    SIGNATURES/CONFIDENTIALITY: You and/or your care partner have signed paperwork which will be entered into your electronic medical record.  These signatures attest to the fact that that the information above on your After Visit Summary has been reviewed and is understood.  Full responsibility of the confidentiality of this discharge information lies with you and/or your care-partner.

## 2022-03-30 NOTE — Progress Notes (Signed)
T1N0M0 rectal adenocarcinoma, diagnosed during colonoscopy 11/2020 Dr. Ardis Hughs (also three subCM TAs removed). Underwent neoadjuvant chemo/XRT and then surgical resection with temporary ileostomy 03/2022 Dr. [name]. Adjuvant xeloda.  Ileostomy reversal 08/2021.  HPI: This is a man with h/o rectal adenocarcinoma   ROS: complete GI ROS as described in HPI, all other review negative.  Constitutional:  No unintentional weight loss   Past Medical History:  Diagnosis Date   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   Family history of bladder cancer    Family history of lymphoma    H/O epididymitis 2011   PONV (postoperative nausea and vomiting)    Seasonal allergies     Past Surgical History:  Procedure Laterality Date   COLONOSCOPY  11/11/2020   HERNIA REPAIR N/A 02/22/2022   repaoired five hernias   ILEOSTOMY CLOSURE N/A 08/03/2021   Procedure: ILEOSTOMY CLOSURE;  Surgeon: Leighton Ruff, MD;  Location: [facility];  Service: General;  Laterality: N/A;   INGUINAL HERNIA REPAIR Bilateral 02/22/2022   Procedure: LAPAROSCOPIC BILATERAL INGUINAL HERNIA REPAIR WITH MESH, BILATERAL FEMORAL HERNIA REPAIR, LYSIS OF ADHESIONS, AND BILATERAL TAP BLOCK;  Surgeon: Michael Boston, MD;  Location: [facility];  Service: General;  Laterality: Bilateral;   LAPAROSCOPY N/A 08/03/2021   Procedure: LAPAROSCOPY DIAGNOSTIC;  Surgeon: Leighton Ruff, MD;  Location: [facility];  Service: General;  Laterality: N/A;   VASECTOMY     VENTRAL HERNIA REPAIR N/A 02/22/2022   Procedure: LAPAROSCOPIC VENTRAL WALL HERNIA REPAIR WITH MESH;  Surgeon: Michael Boston, MD;  Location: [facility];  Service: General;  Laterality: N/A;   WISDOM TOOTH EXTRACTION     XI ROBOTIC ASSISTED LOWER ANTERIOR RESECTION N/A 03/10/2021   Procedure: XI ROBOTIC ASSISTED LOWER ANTERIOR RESECTION WITH LOOP ILEOSTOMY;  Surgeon: Leighton Ruff, MD;  Location: [facility];  Service: General;  Laterality: N/A;    Current Outpatient Medications  Medication Sig Dispense Refill    acetaminophen (TYLENOL) 500 MG tablet Take 500-1,000 mg by mouth every 6 (six) hours as needed (for pain.).     ibuprofen (ADVIL) 200 MG tablet Take 200-400 mg by mouth every 8 (eight) hours as needed (pain).     methocarbamol (ROBAXIN) 750 MG tablet Take 1 tablet (750 mg total) by mouth 4 (four) times daily as needed (use for muscle cramps/pain). 30 tablet 2   Polyethyl Glycol-Propyl Glycol (LUBRICANT EYE DROPS) 0.4-0.3 % SOLN Place 1-2 drops into both eyes 3 (three) times daily as needed (burning/irritated/dry eyes.).     loperamide (IMODIUM) 2 MG capsule Take 1 capsule (2 mg total) by mouth as needed for diarrhea or loose stools. Once abd bloating has improved 120 capsule 3   Multiple Vitamin (MULTIVITAMIN WITH MINERALS) TABS tablet Take 1 tablet by mouth 3 (three) times a week.     oxyCODONE (OXY IR/ROXICODONE) 5 MG immediate release tablet Take 1 tablet (5 mg total) by mouth every 6 (six) hours as needed for moderate pain, severe pain or breakthrough pain. 30 tablet 0   tadalafil (CIALIS) 5 MG tablet Take 1-2 tablets (5-10 mg total) by mouth daily as needed for erectile dysfunction. 20 tablet 11   Current Facility-Administered Medications  Medication Dose Route Frequency Provider Last Rate Last Admin   0.9 %  sodium chloride infusion  500 mL Intravenous Once Milus Banister, MD        Allergies as of 03/30/2022 - Review Complete 03/30/2022  Allergen Reaction Noted   Erythromycin Other (See Comments) 01/25/2012   Penicillins Other (See Comments)     Family  History  Problem Relation Age of Onset   Diabetes Maternal Uncle    Diabetes Father    Colon polyps Father        1 polyp   Stroke Other        PG aunts & PG uncles   Heart disease Paternal Grandfather    Bladder Cancer Paternal Aunt    Lymphoma Nephew 66   Other Nephew 2       kidney transplant   Cancer Neg Hx    Colon cancer Neg Hx    Stomach cancer Neg Hx    Esophageal cancer Neg Hx     Social History    Socioeconomic History   Marital status: Married    Spouse name: Not on file   Number of children: Not on file   Years of education: Not on file   Highest education level: Not on file  Occupational History   Not on file  Tobacco Use   Smoking status: Former    Types: Cigarettes    Quit date: 07/29/2003    Years since quitting: 18.6   Smokeless tobacco: Never   Tobacco comments:    smoked 1993-2004, up to < 4 cigarettes/ day (socially)  Vaping Use   Vaping Use: Never used  Substance and Sexual Activity   Alcohol use: Yes    Alcohol/week: 6.0 standard drinks of alcohol    Types: 6 Standard drinks or equivalent per week   Drug use: No   Sexual activity: Yes    Birth control/protection: Surgical  Other Topics Concern   Not on file  Social History Narrative   Exercise: mountain biking, hiking, walking - tends to exercise in spurts, does some yoga   Social Determinants of Health   Financial Resource Strain: Not on file  Food Insecurity: Not on file  Transportation Needs: Not on file  Physical Activity: Not on file  Stress: Not on file  Social Connections: Not on file  Intimate Partner Violence: Not on file     Physical Exam: BP (!) 113/58   Pulse 75   Temp 97.8 F (36.6 C) (Temporal)   Ht '5\' 8"'$  (1.727 m)   Wt 177 lb (80.3 kg)   SpO2 100%   BMI 26.91 kg/m  Constitutional: generally well-appearing Psychiatric: alert and oriented x3 Lungs: CTA bilaterally Heart: no MCR  Assessment and plan: 51 y.o. male with h/o rectal adenocarcinoma  Surveillance colonoscopy today  Care is appropriate for the ambulatory setting.  Owens Loffler, MD [facility] 03/30/2022, 8:19 AM

## 2022-04-02 ENCOUNTER — Telehealth: Payer: Self-pay

## 2022-04-02 NOTE — Telephone Encounter (Signed)
  Follow up Call-     03/30/2022    7:53 AM 11/11/2020   10:34 AM  Call back number  Post procedure Call Back phone  # [phone]  Permission to leave phone message Yes Yes     Patient questions:  Do you have a fever, pain , or abdominal swelling? No. Pain Score  0 *  Have you tolerated food without any problems? Yes.    Have you been able to return to your normal activities? Yes.    Do you have any questions about your discharge instructions: Diet   No. Medications  No. Follow up visit  No.  Do you have questions or concerns about your Care? No.  Actions: * If pain score is 4 or above: No action needed, pain <4.

## 2022-04-10 ENCOUNTER — Other Ambulatory Visit: Payer: Self-pay

## 2022-04-10 ENCOUNTER — Ambulatory Visit (HOSPITAL_COMMUNITY)
Admission: RE | Admit: 2022-04-10 | Discharge: 2022-04-10 | Disposition: A | Discharge status: Home / Self Care | Payer: BC Managed Care – PPO | Source: Ambulatory Visit | Attending: Hematology | Admitting: Hematology

## 2022-04-10 ENCOUNTER — Inpatient Hospital Stay: Payer: BC Managed Care – PPO | Attending: Nurse Practitioner

## 2022-04-10 DIAGNOSIS — D649 Anemia, unspecified: Secondary | ICD-10-CM

## 2022-04-10 DIAGNOSIS — Z85048 Personal history of other malignant neoplasm of rectum, rectosigmoid junction, and anus: Secondary | ICD-10-CM | POA: Diagnosis not present

## 2022-04-10 DIAGNOSIS — I700 Atherosclerosis of aorta: Secondary | ICD-10-CM | POA: Diagnosis not present

## 2022-04-10 DIAGNOSIS — K6389 Other specified diseases of intestine: Secondary | ICD-10-CM | POA: Diagnosis not present

## 2022-04-10 DIAGNOSIS — I2510 Atherosclerotic heart disease of native coronary artery without angina pectoris: Secondary | ICD-10-CM | POA: Diagnosis not present

## 2022-04-10 DIAGNOSIS — C20 Malignant neoplasm of rectum: Secondary | ICD-10-CM | POA: Diagnosis not present

## 2022-04-10 DIAGNOSIS — R911 Solitary pulmonary nodule: Secondary | ICD-10-CM | POA: Diagnosis not present

## 2022-04-10 DIAGNOSIS — K3189 Other diseases of stomach and duodenum: Secondary | ICD-10-CM | POA: Diagnosis not present

## 2022-04-10 DIAGNOSIS — K769 Liver disease, unspecified: Secondary | ICD-10-CM | POA: Diagnosis not present

## 2022-04-10 LAB — CBC WITH DIFFERENTIAL (CANCER CENTER ONLY)
Abs Immature Granulocytes: 0.01 10*3/uL (ref 0.00–0.07)
Basophils Absolute: 0 10*3/uL (ref 0.0–0.1)
Basophils Relative: 1 %
Eosinophils Absolute: 0.1 10*3/uL (ref 0.0–0.5)
Eosinophils Relative: 3 %
HCT: 40.6 % (ref 39.0–52.0)
Hemoglobin: 14.1 g/dL (ref 13.0–17.0)
Immature Granulocytes: 0 %
Lymphocytes Relative: 16 %
Lymphs Abs: 0.7 10*3/uL (ref 0.7–4.0)
MCH: 30.1 pg (ref 26.0–34.0)
MCHC: 34.7 g/dL (ref 30.0–36.0)
MCV: 86.8 fL (ref 80.0–100.0)
Monocytes Absolute: 0.3 10*3/uL (ref 0.1–1.0)
Monocytes Relative: 7 %
Neutro Abs: 3.4 10*3/uL (ref 1.7–7.7)
Neutrophils Relative %: 73 %
Platelet Count: 223 10*3/uL (ref 150–400)
RBC: 4.68 MIL/uL (ref 4.22–5.81)
RDW: 12.6 % (ref 11.5–15.5)
WBC Count: 4.7 10*3/uL (ref 4.0–10.5)
nRBC: 0 % (ref 0.0–0.2)

## 2022-04-10 LAB — IRON AND IRON BINDING CAPACITY (CC-WL,HP ONLY)
Iron: 132 ug/dL (ref 45–182)
Saturation Ratios: 36 % (ref 17.9–39.5)
TIBC: 365 ug/dL (ref 250–450)
UIBC: 233 ug/dL (ref 117–376)

## 2022-04-10 LAB — CMP (CANCER CENTER ONLY)
ALT: 14 U/L (ref 0–44)
AST: 16 U/L (ref 15–41)
Albumin: 4.4 g/dL (ref 3.5–5.0)
Alkaline Phosphatase: 73 U/L (ref 38–126)
Anion gap: 5 (ref 5–15)
BUN: 12 mg/dL (ref 6–20)
CO2: 30 mmol/L (ref 22–32)
Calcium: 9.2 mg/dL (ref 8.9–10.3)
Chloride: 104 mmol/L (ref 98–111)
Creatinine: 1.04 mg/dL (ref 0.61–1.24)
GFR, Estimated: >60 mL/min (ref >60)
Glucose, Bld: 106 mg/dL — ABNORMAL HIGH (ref 70–99)
Potassium: 4 mmol/L (ref 3.5–5.1)
Sodium: 139 mmol/L (ref 135–145)
Total Bilirubin: 0.4 mg/dL (ref 0.3–1.2)
Total Protein: 7.3 g/dL (ref 6.5–8.1)

## 2022-04-10 LAB — FERRITIN: Ferritin: 80 ng/mL (ref 24–336)

## 2022-04-10 MED ORDER — IOHEXOL 300 MG/ML  SOLN
100.0000 mL | Freq: Once | INTRAMUSCULAR | Status: AC | PRN
  Administered 2022-04-10: 100 mL via INTRAVENOUS

## 2022-04-10 MED ORDER — SODIUM CHLORIDE (PF) 0.9 % IJ SOLN
INTRAMUSCULAR | Status: AC
  Filled 2022-04-10: qty 50

## 2022-04-12 ENCOUNTER — Encounter: Payer: Self-pay | Admitting: Internal Medicine

## 2022-04-12 NOTE — Progress Notes (Signed)
Subjective:    Patient ID: Cesar Herrera, male    DOB: 1971/08/24, 51 y.o.   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  He is eating healthy.     Medications and allergies reviewed with patient and updated if appropriate.  Current Outpatient Medications on File Prior to Visit  Medication Sig Dispense Refill   acetaminophen (TYLENOL) 500 MG tablet Take 500-1,000 mg by mouth every 6 (six) hours as needed (for pain.).     ibuprofen (ADVIL) 200 MG tablet Take 200-400 mg by mouth every 8 (eight) hours as needed (pain).     loperamide (IMODIUM) 2 MG capsule Take 1 capsule (2 mg total) by mouth as needed for diarrhea or loose stools. Once abd bloating has improved 120 capsule 3   Multiple Vitamin (MULTIVITAMIN WITH MINERALS) TABS tablet Take 1 tablet by mouth 3 (three) times a week.     Polyethyl Glycol-Propyl Glycol (LUBRICANT EYE DROPS) 0.4-0.3 % SOLN Place 1-2 drops into both eyes 3 (three) times daily as needed (burning/irritated/dry eyes.).     tadalafil (CIALIS) 5 MG tablet Take 1-2 tablets (5-10 mg total) by mouth daily as needed for erectile dysfunction. 20 tablet 11   No current facility-administered medications on file prior to visit.    Review of Systems  Respiratory:  Negative for shortness of breath.   Cardiovascular:  Negative for chest pain, palpitations and leg swelling.  Neurological:  Negative for dizziness, light-headedness and headaches.       Objective:   Vitals:   04/13/22 1550  BP: 120/80  Pulse: 77  Temp: 97.6 F (36.4 C)  SpO2: 98%   BP Readings from Last 3 Encounters:  04/13/22 120/80  04/13/22 117/73  03/30/22  122/78   Wt Readings from Last 3 Encounters:  04/13/22 172 lb (78 kg)  04/13/22 172 lb 4 oz (78.1 kg)  03/30/22 177 lb (80.3 kg)   Body mass index is 26.15 kg/m.    Physical Exam Constitutional:      General: He is not in acute distress.    Appearance: Normal appearance. He is not ill-appearing.  HENT:     Head: Normocephalic and atraumatic.  Eyes:     Conjunctiva/sclera: Conjunctivae normal.  Cardiovascular:     Rate and Rhythm: Normal rate and regular rhythm.     Heart sounds: Normal heart sounds. No murmur heard. Pulmonary:     Effort: Pulmonary effort is normal. No respiratory distress.     Breath sounds: Normal breath sounds. No wheezing or rales.  Musculoskeletal:     Right lower leg: No edema.     Left lower leg: No edema.  Skin:    General: Skin is warm and dry.     Findings: No rash.  Neurological:     Mental Status: He is alert. Mental status is at baseline.  Psychiatric:        Mood and Affect: Mood normal.            Assessment & Plan:    See Problem List for Assessment and Plan of chronic medical problems.   I spent 20 minutes dedicated  to the care of this patient on the date of this encounter including review of recent labs, imaging, obtaining history, communicating with the patient, ordering medications, tests, and documenting clinical information in the EHR

## 2022-04-13 ENCOUNTER — Ambulatory Visit (INDEPENDENT_AMBULATORY_CARE_PROVIDER_SITE_OTHER): Payer: BC Managed Care – PPO | Admitting: Internal Medicine

## 2022-04-13 ENCOUNTER — Other Ambulatory Visit: Payer: Self-pay

## 2022-04-13 ENCOUNTER — Inpatient Hospital Stay (HOSPITAL_BASED_OUTPATIENT_CLINIC_OR_DEPARTMENT_OTHER): Payer: BC Managed Care – PPO | Admitting: Hematology

## 2022-04-13 VITALS — BP 120/80 | HR 77 | Temp 97.6°F | Ht 68.0 in | Wt 172.0 lb

## 2022-04-13 VITALS — BP 117/73 | HR 77 | Temp 97.6°F | Resp 18 | Wt 172.2 lb

## 2022-04-13 DIAGNOSIS — C20 Malignant neoplasm of rectum: Secondary | ICD-10-CM

## 2022-04-13 DIAGNOSIS — R739 Hyperglycemia, unspecified: Secondary | ICD-10-CM | POA: Diagnosis not present

## 2022-04-13 DIAGNOSIS — I2510 Atherosclerotic heart disease of native coronary artery without angina pectoris: Secondary | ICD-10-CM

## 2022-04-13 NOTE — Patient Instructions (Signed)
     Blood work was ordered.  Have it done next week.     Medications changes include :   none    A Ct scan of your heart was ordered.     Someone will call you to schedule an appointment.

## 2022-04-13 NOTE — Assessment & Plan Note (Signed)
Chronic Family history of diabetes

## 2022-04-13 NOTE — Assessment & Plan Note (Signed)
New Recent CT scan showed coronary artery atherosclerosis and aortic atherosclerosis Discussed findings Discussed that we should evaluate further to get a better sense of the severity CT coronary calcium score ordered Lipid panel, A1c ordered Depending on above results may need to start aspirin 81 mg daily and statin May also need to consider cardiology referral Continue regular exercise-currently walking Continue healthy diet Blood pressure well controlled

## 2022-04-13 NOTE — Progress Notes (Signed)
[place]   [phone] Fax:([phone]   Clinic Follow up Note   Patient Care Team: [name], MD as PCP - General (Internal Medicine) [name], NP as Nurse Practitioner (Nurse Practitioner) [name], MD as Consulting Physician (Oncology) Michael Boston, MD as Consulting Physician (General Surgery)  Date of Service:  04/13/2022  CHIEF COMPLAINT: f/u of rectal cancer  CURRENT THERAPY:  Surveillance  ASSESSMENT & PLAN:  Cesar Herrera is a 51 y.o. male with   1. Adenocarcinoma of the rectum, cT3N0/N1, stage II or III, ypT1N0 -presented with intermittent loose stool and rectal bleeding. Colonoscopy on 11/11/20 showed upper/mid rectal adenocarcinoma. -Staging work-up negative distant metastatic disease -Local staging pelvic MRI shows T3N1 disease, however the node is a single 5 mm mesorectal lymph node and could be reactive, could be N0. -s/p neoadjuvant concurrent chemo/RT with Xeloda 12/05/20 - 01/11/21.  -s/p resection and loop ileostomy on 03/10/21 under Dr. [name]. Pathology showed 2 mm residual tumor, all negative lymph nodes (0/13).  -He completed 3 months adjuvant Xeloda on 07/09/21. He tolerated well with mild fatigue. -s/p ileostomy reversal on 08/03/21, benign pathology.  -surveillance colonoscopy on 03/20/22 with Dr. Ardis Hughs showed only diverticulosis. Recall in 2026. -repeat CT AP 04/10/22 was stable, no evidence of new or progressive disease. I reviewed the results with them today. -he is clinically doing well. He notes some residual bowel urgency but no other disturbances. Labs from 04/10/22 reviewed, WNL.   2. Genetics -His genetic testing from 12/23/20 was negative for pathogenetic mutations. 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   SUMMARY OF ONCOLOGIC HISTORY: Oncology History Overview Note   Cancer Staging   Rectal adenocarcinoma [facility]) Staging form: Colon and Rectum, AJCC 8th Edition - Clinical stage from 11/23/2020: Stage IIIB (cT3, cN1, cM0) - Signed by [name], NP on 11/23/2020 Stage prefix: Initial diagnosis - Pathologic stage from 03/10/2021: Stage I (ypT1, pN0, cM0) - Signed by [name], MD on 05/31/2021 Stage prefix: Post-therapy Response to neoadjuvant therapy: Partial response Total positive nodes: 0 Histologic grading system: 4 grade system Histologic grade (G): G2 Residual tumor (R): R0 - None    Rectal adenocarcinoma ([place])  11/11/2020 Procedure   Colonoscopy by Dr. Ardis Hughs Impression - Three 2 to 5 mm polyps in the descending colon, in the transverse colon and in the cecum, removed with a cold snare. Complete resection. 2/3 polyps retrieved, sent to pathology. - Rectal tumor that appears malignant, distal edge 7cm from the anal verge. This was biopsied and then labeled with Spot submucosal tattoo. - The examination was otherwise normal on direct and retroflexion views.   11/11/2020 Initial Biopsy   Diagnosis 1. Descending Colon Polyp - TUBULAR ADENOMA (3 OF 3 FRAGMENTS) - NO HIGH-GRADE DYSPLASIA OR MALIGNANCY IDENTIFIED 2. Rectum, biopsy - ADENOCARCINOMA, AT LEAST INTRAMUCOSAL   11/11/2020 Tumor Marker   CEA: 1.4 CA 19-9: 10   11/15/2020 Imaging   Pelvic MRI for local staging IMPRESSION: Signs of potential T3 B disease, tortuosity of the rectum at the level of the tumor makes assessment difficult, distorted by the polypoid eccentric tumor in the high rectum as described.   N1 disease.   No extramural venous invasion. Tumor extends just to the level of the APR in this center below this level.   11/17/2020 Imaging   CT  CAP IMPRESSION: 1. Known rectal mass with a 5 mm in short axis left mesorectal lymph node better shown on the prior exam and only faintly apparent on the CT. 2. 1.4 by 1.2 by 0.9 cm hypodense lesion in the right hepatic lobe on image 50 of series  2, poorly seen on the delayed images. This lesion is indeterminate by CT and could be benign or malignant. Imaging possibilities for with further workup might include hepatic protocol MRI with and without contrast or PET-CT. 3. Lucent lesion of the left posterior L2 vertebral body potentially extending slightly into the pedicle, measuring 1.9 by 1.4 cm on image 116 of series 5. This previously measured 1.6 by 1.3 cm on 03/28/2010, accordingly making it unlikely to be a metastatic lesion. 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 4. Small right middle lobe and right lower lobe pulmonary nodules are likely benign but merit surveillance in this context.   11/18/2020 Initial Diagnosis   Rectal adenocarcinoma ([place])   11/23/2020 Cancer Staging   Staging form: Colon and Rectum, AJCC 8th Edition - Clinical stage from 11/23/2020: Stage IIIB (cT3, cN1, cM0) - Signed by [name], NP on 11/23/2020 Stage prefix: Initial diagnosis   12/02/2020 Imaging   MRI Liver  IMPRESSION: Tiny benign hemangioma in the right hepatic lobe, which corresponds to the lesion seen on recent CT. No evidence of metastatic disease within the liver or abdomen.   12/05/2020 - 01/11/2021 Radiation Therapy   Concurrent chemoradiation with Dr Lisbeth Renshaw    12/05/2020 - 01/11/2021 Chemotherapy   Concurrent chemoradiation with Xeloda 209m in the AM and 15045min the PM M-F on days of Radiation   12/23/2020 Genetic Testing   Negative genetic testing:  No pathogenic variants detected on the [employer] CancerNext-Expanded + RNAinsight panel. 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 The report date is 12/23/2020.  The CancerNext-Expanded + RNAinsight gene panel offered by [employer] includes sequencing and rearrangement analysis for the following 77 genes: AIP, ALK, APC, ATM, AXIN2, BAP1, BARD1, BLM, BMPR1A, BRCA1,  BRCA2, BRIP1, CDC73, CDH1, CDK4, CDKN1B, CDKN2A, CHEK2, CTNNA1, DICER1, FANCC, FH, FLCN, GALNT12, KIF1B, LZTR1, MAX, MEN1, MET, MLH1, MSH2, MSH3, MSH6, MUTYH, NBN, NF1, NF2, NTHL1, PALB2, PHOX2B, PMS2, POT1, PRKAR1A, PTCH1, PTEN, RAD51C, RAD51D, RB1, RECQL, RET, SDHA, SDHAF2, SDHB, SDHC, SDHD, SMAD4, SMARCA4, SMARCB1, SMARCE1, STK11, SUFU, TMEM127, TP53, TSC1, TSC2, VHL and XRCC2 (sequencing and deletion/duplication); EGFR, EGLN1, HOXB13, KIT, MITF, PDGFRA, POLD1 and POLE (sequencing only); EPCAM and GREM1 (deletion/duplication only). RNA data is routinely analyzed for use in variant interpretation for all genes.    03/10/2021 Surgery   XI ROBOTIC ASSISTED LOWER ANTERIOR RESECTION WITH LOOP ILEOSTOMY, Dr. ThMarcello MooresFINAL MICROSCOPIC DIAGNOSIS:   A. COLON, RECTOSIGMOID, RESECTION:  - Invasive adenocarcinoma, moderately differentiated, spanning 2 mm.  - Tumor invades the submucosa.  - Resection margins are negative.  - No carcinoma in thirteen of thirteen lymph nodes (0/13).  - See oncology table.   B. FINAL DISTAL MARGIN:  - Benign colonic mucosa.  - No dysplasia or malignancy.    03/10/2021 Cancer Staging   Staging form: Colon and Rectum, AJCC 8th Edition - Pathologic stage from 03/10/2021: Stage I (ypT1, pN0, cM0) - Signed by FeTruitt MerleMD on 05/31/2021 Stage prefix: Post-therapy Response to neoadjuvant therapy: Partial response Total positive nodes: 0 Histologic grading system: 4 grade system Histologic grade (G): G2 Residual  tumor (R): R0 - None   07/18/2021 Imaging   EXAM: CT CHEST, ABDOMEN, AND PELVIS WITH CONTRAST  IMPRESSION: 1. Postsurgical changes of low anterior resection and loop ileostomy formation with edema/soft tissue thickening along the presacral space measuring up to 12 mm in thickness, favored to reflect postsurgical/post treatment change. Attention on follow-up exams is recommended. 2. No evidence of metastatic disease within the chest, abdomen, or pelvis. 3. Stable  small right middle lobe and right lower lobe pulmonary nodules, likely benign. 4. Stable 1.4 cm benign hepatic hemangioma.   10/16/2021 Imaging   EXAM: CT ABDOMEN AND PELVIS WITH CONTRAST  IMPRESSION: 1. Postsurgical change of low anterior resection with interval loop ileostomy takedown and similar, likely post treatment/surgical related, presacral soft tissue thickening. No evidence of new or progressive disease in the chest abdomen or pelvis to suggest recurrence or metastatic disease. 2. Stable tiny right middle lobe pulmonary nodules measuring up to 4 mm, favored benign. Continued attention on follow-up imaging suggested. 3. Stable benign 1.3 cm hepatic hemangioma. 4. 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      INTERVAL HISTORY:  Cesar Herrera is here for a follow up of rectal cancer. He was last seen by me on 01/12/22. He presents to the clinic accompanied by his wife. He reports he is feeling well overall. He reports he has at least one BM every day. He tells me his hernia repair surgery was extensive because he had 5 separate hernias repaired. He notes recovery took 5-6 weeks.   All other systems were reviewed with the patient and are negative.  MEDICAL HISTORY:  Past Medical History:  Diagnosis Date   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   Family history of bladder cancer    Family history of lymphoma    H/O epididymitis 2011   PONV (postoperative nausea and vomiting)    Seasonal allergies     SURGICAL HISTORY: Past Surgical History:  Procedure Laterality Date   COLONOSCOPY  11/11/2020   HERNIA REPAIR N/A 02/22/2022   repaoired five hernias   ILEOSTOMY CLOSURE N/A 08/03/2021   Procedure: ILEOSTOMY CLOSURE;  Surgeon: Leighton Ruff, MD;  Location: [facility];  Service: General;  Laterality: N/A;   INGUINAL HERNIA REPAIR Bilateral 02/22/2022   Procedure: LAPAROSCOPIC BILATERAL INGUINAL HERNIA REPAIR WITH MESH, BILATERAL FEMORAL HERNIA REPAIR, LYSIS OF  ADHESIONS, AND BILATERAL TAP BLOCK;  Surgeon: Michael Boston, MD;  Location: [facility];  Service: General;  Laterality: Bilateral;   LAPAROSCOPY N/A 08/03/2021   Procedure: LAPAROSCOPY DIAGNOSTIC;  Surgeon: Leighton Ruff, MD;  Location: [facility];  Service: General;  Laterality: N/A;   VASECTOMY     VENTRAL HERNIA REPAIR N/A 02/22/2022   Procedure: LAPAROSCOPIC VENTRAL WALL HERNIA REPAIR WITH MESH;  Surgeon: Michael Boston, MD;  Location: [facility];  Service: General;  Laterality: N/A;   WISDOM TOOTH EXTRACTION     XI ROBOTIC ASSISTED LOWER ANTERIOR RESECTION N/A 03/10/2021   Procedure: XI ROBOTIC ASSISTED LOWER ANTERIOR RESECTION WITH LOOP ILEOSTOMY;  Surgeon: Leighton Ruff, MD;  Location: [facility];  Service: General;  Laterality: N/A;    I have reviewed the social history and family history with the patient and they are unchanged from previous note.  ALLERGIES:  is allergic to erythromycin and penicillins.  MEDICATIONS:  Current Outpatient Medications  Medication Sig Dispense Refill   acetaminophen (TYLENOL) 500 MG tablet Take 500-1,000 mg by mouth every 6 (six) hours as needed (for pain.).     ibuprofen (ADVIL) 200 MG tablet Take 200-400  mg by mouth every 8 (eight) hours as needed (pain).     loperamide (IMODIUM) 2 MG capsule Take 1 capsule (2 mg total) by mouth as needed for diarrhea or loose stools. Once abd bloating has improved 120 capsule 3   Multiple Vitamin (MULTIVITAMIN WITH MINERALS) TABS tablet Take 1 tablet by mouth 3 (three) times a week.     Polyethyl Glycol-Propyl Glycol (LUBRICANT EYE DROPS) 0.4-0.3 % SOLN Place 1-2 drops into both eyes 3 (three) times daily as needed (burning/irritated/dry eyes.).     tadalafil (CIALIS) 5 MG tablet Take 1-2 tablets (5-10 mg total) by mouth daily as needed for erectile dysfunction. 20 tablet 11   No current facility-administered medications for this visit.    PHYSICAL EXAMINATION: ECOG PERFORMANCE STATUS: {CHL ONC ECOG HE:[id number]}  Vitals:    04/13/22 1459  BP: 117/73  Pulse: 77  Resp: 18  Temp: 97.6 F (36.4 C)  SpO2: 98%   Wt Readings from Last 3 Encounters:  04/13/22 172 lb (78 kg)  04/13/22 172 lb 4 oz (78.1 kg)  03/30/22 177 lb (80.3 kg)     GENERAL:alert, no distress and comfortable SKIN: skin color normal, no rashes or significant lesions EYES: normal, Conjunctiva are pink and non-injected, sclera clear  NEURO: alert & oriented x 3 with fluent speech  LABORATORY DATA:  I have reviewed the data as listed    Latest Ref Rng & Units 04/10/2022    1:04 PM 02/22/2022   12:58 PM 02/07/2022   11:24 AM  CBC  WBC 4.0 - 10.5 K/uL 4.7  6.8  4.1   Hemoglobin 13.0 - 17.0 g/dL 14.1  14.0  15.9   Hematocrit 39.0 - 52.0 % 40.6  42.0  44.5   Platelets 150 - 400 K/uL 223  180  223         Latest Ref Rng & Units 04/10/2022    1:04 PM 02/22/2022   12:58 PM 01/12/2022    2:27 PM  CMP  Glucose 70 - 99 mg/dL 106   130   BUN 6 - 20 mg/dL 12   13   Creatinine 0.61 - 1.24 mg/dL 1.04  1.05  1.12   Sodium 135 - 145 mmol/L 139   139   Potassium 3.5 - 5.1 mmol/L 4.0   3.9   Chloride 98 - 111 mmol/L 104   105   CO2 22 - 32 mmol/L 30   29   Calcium 8.9 - 10.3 mg/dL 9.2   8.8   Total Protein 6.5 - 8.1 g/dL 7.3   6.6   Total Bilirubin 0.3 - 1.2 mg/dL 0.4   0.5   Alkaline Phos 38 - 126 U/L 73   68   AST 15 - 41 U/L 16   16   ALT 0 - 44 U/L 14   14       RADIOGRAPHIC STUDIES: I have personally reviewed the radiological images as listed and agreed with the findings in the report. No results found.    No orders of the defined types were placed in this encounter.  All questions were answered. The patient knows to call the clinic with any problems, questions or concerns. No barriers to learning was detected. The total time spent in the appointment was {CHL ONC TIME VISIT - EHUDJ:[id number]}.     [name], MD 04/13/2022   I, Wilburn Mylar, am acting as scribe for [name], MD.   {Add scribe attestation statement}

## 2022-04-14 ENCOUNTER — Encounter: Payer: Self-pay | Admitting: Hematology

## 2022-04-17 ENCOUNTER — Other Ambulatory Visit (INDEPENDENT_AMBULATORY_CARE_PROVIDER_SITE_OTHER): Payer: BC Managed Care – PPO

## 2022-04-17 DIAGNOSIS — R739 Hyperglycemia, unspecified: Secondary | ICD-10-CM | POA: Diagnosis not present

## 2022-04-17 DIAGNOSIS — I2510 Atherosclerotic heart disease of native coronary artery without angina pectoris: Secondary | ICD-10-CM | POA: Diagnosis not present

## 2022-04-17 LAB — LIPID PANEL
Cholesterol: 151 mg/dL (ref 0–200)
HDL: 53.9 mg/dL (ref >39.00)
LDL Cholesterol: 84 mg/dL (ref 0–99)
NonHDL: 96.8
Total CHOL/HDL Ratio: 3
Triglycerides: 65 mg/dL (ref 0.0–149.0)
VLDL: 13 mg/dL (ref 0.0–40.0)

## 2022-04-17 LAB — HEMOGLOBIN A1C: Hgb A1c MFr Bld: 5.3 % (ref 4.6–6.5)

## 2022-05-10 ENCOUNTER — Encounter (HOSPITAL_BASED_OUTPATIENT_CLINIC_OR_DEPARTMENT_OTHER): Payer: Self-pay

## 2022-05-10 ENCOUNTER — Ambulatory Visit (HOSPITAL_BASED_OUTPATIENT_CLINIC_OR_DEPARTMENT_OTHER)
Admission: RE | Admit: 2022-05-10 | Discharge: 2022-05-10 | Disposition: A | Discharge status: Home / Self Care | Payer: BC Managed Care – PPO | Source: Ambulatory Visit | Attending: Internal Medicine | Admitting: Internal Medicine

## 2022-05-10 DIAGNOSIS — I2510 Atherosclerotic heart disease of native coronary artery without angina pectoris: Secondary | ICD-10-CM | POA: Insufficient documentation

## 2022-05-12 ENCOUNTER — Encounter: Payer: Self-pay | Admitting: Internal Medicine

## 2022-05-12 DIAGNOSIS — E7849 Other hyperlipidemia: Secondary | ICD-10-CM

## 2022-05-12 DIAGNOSIS — R931 Abnormal findings on diagnostic imaging of heart and coronary circulation: Secondary | ICD-10-CM | POA: Insufficient documentation

## 2022-05-14 MED ORDER — ROSUVASTATIN CALCIUM 5 MG PO TABS: 5.0000 mg | ORAL_TABLET | Freq: Every day | ORAL | 3 refills | Status: DC

## 2022-07-11 DIAGNOSIS — Z85828 Personal history of other malignant neoplasm of skin: Secondary | ICD-10-CM | POA: Diagnosis not present

## 2022-07-11 DIAGNOSIS — L905 Scar conditions and fibrosis of skin: Secondary | ICD-10-CM | POA: Diagnosis not present

## 2022-08-11 NOTE — Progress Notes (Unsigned)
[place] OFFICE PROGRESS NOTE  [name], MD [place] [address]  DIAGNOSIS:  f/u of rectal cancer   Oncology History Overview Note   Cancer Staging  Rectal adenocarcinoma [facility]) Staging form: Colon and Rectum, AJCC 8th Edition - Clinical stage from 11/23/2020: Stage IIIB (cT3, cN1, cM0) - Signed by [name], NP on 11/23/2020 Stage prefix: Initial diagnosis - Pathologic stage from 03/10/2021: Stage I (ypT1, pN0, cM0) - Signed by [name], MD on 05/31/2021 Stage prefix: Post-therapy Response to neoadjuvant therapy: Partial response Total positive nodes: 0 Histologic grading system: 4 grade system Histologic grade (G): G2 Residual tumor (R): R0 - None    Rectal adenocarcinoma ([place])  11/11/2020 Procedure   Colonoscopy by Dr. Ardis Hughs Impression - Three 2 to 5 mm polyps in the descending colon, in the transverse colon and in the cecum, removed with a cold snare. Complete resection. 2/3 polyps retrieved, sent to pathology. - Rectal tumor that appears malignant, distal edge 7cm from the anal verge. This was biopsied and then labeled with Spot submucosal tattoo. - The examination was otherwise normal on direct and retroflexion views.   11/11/2020 Initial Biopsy   Diagnosis 1. Descending Colon Polyp - TUBULAR ADENOMA (3 OF 3 FRAGMENTS) - NO HIGH-GRADE DYSPLASIA OR MALIGNANCY IDENTIFIED 2. Rectum, biopsy - ADENOCARCINOMA, AT LEAST INTRAMUCOSAL   11/11/2020 Tumor Marker   CEA: 1.4 CA 19-9: 10   11/15/2020 Imaging   Pelvic MRI for local staging IMPRESSION: Signs of potential T3 B disease, tortuosity of the rectum at the level of the tumor makes assessment difficult, distorted by the polypoid eccentric tumor in the high rectum as described.   N1 disease.   No extramural venous invasion. Tumor extends just to the level of the APR in this center below this level.   11/17/2020 Imaging   CT CAP IMPRESSION: 1. Known rectal mass with a 5 mm  in short axis left mesorectal lymph node better shown on the prior exam and only faintly apparent on the CT. 2. 1.4 by 1.2 by 0.9 cm hypodense lesion in the right hepatic lobe on image 50 of series 2, poorly seen on the delayed images. This lesion is indeterminate by CT and could be benign or malignant. Imaging possibilities for with further workup might include hepatic protocol MRI with and without contrast or PET-CT. 3. Lucent lesion of the left posterior L2 vertebral body potentially extending slightly into the pedicle, measuring 1.9 by 1.4 cm on image 116 of series 5. This previously measured 1.6 by 1.3 cm on 03/28/2010, accordingly making it unlikely to be a metastatic lesion. 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 4. Small right middle lobe and right lower lobe pulmonary nodules are likely benign but merit surveillance in this context.   11/18/2020 Initial Diagnosis   Rectal adenocarcinoma ([place])   11/23/2020 Cancer Staging   Staging form: Colon and Rectum, AJCC 8th Edition - Clinical stage from 11/23/2020: Stage IIIB (cT3, cN1, cM0) - Signed by [name], NP on 11/23/2020 Stage prefix: Initial diagnosis   12/02/2020 Imaging   MRI Liver  IMPRESSION: Tiny benign hemangioma in the right hepatic lobe, which corresponds to the lesion seen on recent CT. No evidence of metastatic disease within the liver or abdomen.   12/05/2020 - 01/11/2021 Radiation Therapy   Concurrent chemoradiation with Dr Lisbeth Renshaw    12/05/2020 - 01/11/2021 Chemotherapy   Concurrent chemoradiation  with Xeloda 2078m in the AM and 15065min the PM M-F on days of Radiation   12/23/2020 Genetic Testing   Negative genetic testing:  No pathogenic variants detected on the [employer] CancerNext-Expanded + RNAinsight panel. 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 The report date is 12/23/2020.  The  CancerNext-Expanded + RNAinsight gene panel offered by [employer] includes sequencing and rearrangement analysis for the following 77 genes: AIP, ALK, APC, ATM, AXIN2, BAP1, BARD1, BLM, BMPR1A, BRCA1, BRCA2, BRIP1, CDC73, CDH1, CDK4, CDKN1B, CDKN2A, CHEK2, CTNNA1, DICER1, FANCC, FH, FLCN, GALNT12, KIF1B, LZTR1, MAX, MEN1, MET, MLH1, MSH2, MSH3, MSH6, MUTYH, NBN, NF1, NF2, NTHL1, PALB2, PHOX2B, PMS2, POT1, PRKAR1A, PTCH1, PTEN, RAD51C, RAD51D, RB1, RECQL, RET, SDHA, SDHAF2, SDHB, SDHC, SDHD, SMAD4, SMARCA4, SMARCB1, SMARCE1, STK11, SUFU, TMEM127, TP53, TSC1, TSC2, VHL and XRCC2 (sequencing and deletion/duplication); EGFR, EGLN1, HOXB13, KIT, MITF, PDGFRA, POLD1 and POLE (sequencing only); EPCAM and GREM1 (deletion/duplication only). RNA data is routinely analyzed for use in variant interpretation for all genes.    03/10/2021 Surgery   XI ROBOTIC ASSISTED LOWER ANTERIOR RESECTION WITH LOOP ILEOSTOMY, Dr. ThMarcello MooresFINAL MICROSCOPIC DIAGNOSIS:   A. COLON, RECTOSIGMOID, RESECTION:  - Invasive adenocarcinoma, moderately differentiated, spanning 2 mm.  - Tumor invades the submucosa.  - Resection margins are negative.  - No carcinoma in thirteen of thirteen lymph nodes (0/13).  - See oncology table.   B. FINAL DISTAL MARGIN:  - Benign colonic mucosa.  - No dysplasia or malignancy.    03/10/2021 Cancer Staging   Staging form: Colon and Rectum, AJCC 8th Edition - Pathologic stage from 03/10/2021: Stage I (ypT1, pN0, cM0) - Signed by FeTruitt MerleMD on 05/31/2021 Stage prefix: Post-therapy Response to neoadjuvant therapy: Partial response Total positive nodes: 0 Histologic grading system: 4 grade system Histologic grade (G): G2 Residual tumor (R): R0 - None   07/18/2021 Imaging   EXAM: CT CHEST, ABDOMEN, AND PELVIS WITH CONTRAST  IMPRESSION: 1. Postsurgical changes of low anterior resection and loop ileostomy formation with edema/soft tissue thickening along the presacral space measuring up to 12  mm in thickness, favored to reflect postsurgical/post treatment change. Attention on follow-up exams is recommended. 2. No evidence of metastatic disease within the chest, abdomen, or pelvis. 3. Stable small right middle lobe and right lower lobe pulmonary nodules, likely benign. 4. Stable 1.4 cm benign hepatic hemangioma.   10/16/2021 Imaging   EXAM: CT ABDOMEN AND PELVIS WITH CONTRAST  IMPRESSION: 1. Postsurgical change of low anterior resection with interval loop ileostomy takedown and similar, likely post treatment/surgical related, presacral soft tissue thickening. No evidence of new or progressive disease in the chest abdomen or pelvis to suggest recurrence or metastatic disease. 2. Stable tiny right middle lobe pulmonary nodules measuring up to 4 mm, favored benign. Continued attention on follow-up imaging suggested. 3. Stable benign 1.3 cm hepatic hemangioma. 4. 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     CURRENT THERAPY: Surveillance   INTERVAL HISTORY: BrTYRIE PORZIO76.o. male returns to the clinic for a follow up visit accompanied by his wife. The patient is feeling well today without any concerning complaints. The patient is currently on observation from his history of colorectal cancer. He is doing well overall. Denies any fever, chills, night sweats, or weight loss. He denies appetite changes. Denies any chest pain, shortness of breath, cough, or hemoptysis. Denies any nausea or vomiting. He sometimes has intermittent diarrhea/constipation since his surgery.  Denies rectal bleeding. Denies jaundice, itching, abdominal fullness/bloating. He denies any urinary changes.  The patient is here today for evaluation and repeat blood work.     MEDICAL HISTORY: Past Medical History:  Diagnosis Date   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   Family history of bladder cancer    Family history of lymphoma    H/O epididymitis 2011   PONV (postoperative nausea and vomiting)     Seasonal allergies     ALLERGIES:  is allergic to erythromycin and penicillins.  MEDICATIONS:  Current Outpatient Medications  Medication Sig Dispense Refill   acetaminophen (TYLENOL) 500 MG tablet Take 500-1,000 mg by mouth every 6 (six) hours as needed (for pain.).     ibuprofen (ADVIL) 200 MG tablet Take 200-400 mg by mouth every 8 (eight) hours as needed (pain).     loperamide (IMODIUM) 2 MG capsule Take 1 capsule (2 mg total) by mouth as needed for diarrhea or loose stools. Once abd bloating has improved 120 capsule 3   Multiple Vitamin (MULTIVITAMIN WITH MINERALS) TABS tablet Take 1 tablet by mouth 3 (three) times a week.     Polyethyl Glycol-Propyl Glycol (LUBRICANT EYE DROPS) 0.4-0.3 % SOLN Place 1-2 drops into both eyes 3 (three) times daily as needed (burning/irritated/dry eyes.).     rosuvastatin (CRESTOR) 5 MG tablet Take 1 tablet (5 mg total) by mouth daily. 90 tablet 3   tadalafil (CIALIS) 5 MG tablet Take 1-2 tablets (5-10 mg total) by mouth daily as needed for erectile dysfunction. 20 tablet 11   No current facility-administered medications for this visit.    SURGICAL HISTORY:  Past Surgical History:  Procedure Laterality Date   COLONOSCOPY  11/11/2020   HERNIA REPAIR N/A 02/22/2022   repaoired five hernias   ILEOSTOMY CLOSURE N/A 08/03/2021   Procedure: ILEOSTOMY CLOSURE;  Surgeon: Leighton Ruff, MD;  Location: [facility];  Service: General;  Laterality: N/A;   INGUINAL HERNIA REPAIR Bilateral 02/22/2022   Procedure: LAPAROSCOPIC BILATERAL INGUINAL HERNIA REPAIR WITH MESH, BILATERAL FEMORAL HERNIA REPAIR, LYSIS OF ADHESIONS, AND BILATERAL TAP BLOCK;  Surgeon: Michael Boston, MD;  Location: [facility];  Service: General;  Laterality: Bilateral;   LAPAROSCOPY N/A 08/03/2021   Procedure: LAPAROSCOPY DIAGNOSTIC;  Surgeon: Leighton Ruff, MD;  Location: [facility];  Service: General;  Laterality: N/A;   VASECTOMY     VENTRAL HERNIA REPAIR N/A 02/22/2022   Procedure: LAPAROSCOPIC  VENTRAL WALL HERNIA REPAIR WITH MESH;  Surgeon: Michael Boston, MD;  Location: [facility];  Service: General;  Laterality: N/A;   WISDOM TOOTH EXTRACTION     XI ROBOTIC ASSISTED LOWER ANTERIOR RESECTION N/A 03/10/2021   Procedure: XI ROBOTIC ASSISTED LOWER ANTERIOR RESECTION WITH LOOP ILEOSTOMY;  Surgeon: Leighton Ruff, MD;  Location: [facility];  Service: General;  Laterality: N/A;    REVIEW OF SYSTEMS:   Review of Systems  Constitutional: Negative for appetite change, chills, fatigue, fever and unexpected weight change.  HENT: Negative for mouth sores, nosebleeds, sore throat and trouble swallowing.   Eyes: Negative for eye problems and icterus.  Respiratory: Negative for cough, hemoptysis, shortness of breath and wheezing.   Cardiovascular: Negative for chest pain and leg swelling.  Gastrointestinal: Negative for abdominal pain, constipation, diarrhea, nausea and vomiting.  Genitourinary: Negative for bladder incontinence, difficulty urinating, dysuria, frequency and hematuria.   Musculoskeletal: Negative for back pain, gait problem, neck pain and neck stiffness.  Skin: Negative for itching and rash.  Neurological: Negative for dizziness, extremity weakness, gait problem, headaches, light-headedness and seizures.  Hematological: Negative for adenopathy. Does not bruise/bleed easily.  Psychiatric/Behavioral: Negative for confusion, depression and sleep disturbance. The patient is not nervous/anxious.     PHYSICAL EXAMINATION:  There were no vitals taken for this visit.  ECOG PERFORMANCE STATUS: 1  Physical Exam  Constitutional: Oriented to person, place, and time and well-developed, well-nourished, and in no distress.  HENT:  Head: Normocephalic and atraumatic.  Mouth/Throat: Oropharynx is clear and moist. No oropharyngeal exudate.  Eyes: Conjunctivae are normal. Right eye exhibits no discharge. Left eye exhibits no discharge. No scleral icterus.  Neck: Normal range of motion. Neck supple.   Cardiovascular: Normal rate, regular rhythm, normal heart sounds and intact distal pulses.   Pulmonary/Chest: Effort normal and breath sounds normal. No respiratory distress. No wheezes. No rales.  Abdominal: Soft. Bowel sounds are normal. Exhibits no distension and no mass. There is no tenderness.  Musculoskeletal: Normal range of motion. Exhibits no edema.  Lymphadenopathy:    No cervical adenopathy.  Neurological: Alert and oriented to person, place, and time. Exhibits normal muscle tone. Gait normal. Coordination normal.  Skin: Skin is warm and dry. No rash noted. Not diaphoretic. No erythema. No pallor.  Psychiatric: Mood, memory and judgment normal.  Vitals reviewed.  LABORATORY DATA: Lab Results  Component Value Date   WBC 4.7 04/10/2022   HGB 14.1 04/10/2022   HCT 40.6 04/10/2022   MCV 86.8 04/10/2022   PLT 223 04/10/2022      Chemistry      Component Value Date/Time   NA 139 04/10/2022 1304   K 4.0 04/10/2022 1304   CL 104 04/10/2022 1304   CO2 30 04/10/2022 1304   BUN 12 04/10/2022 1304   CREATININE 1.04 04/10/2022 1304      Component Value Date/Time   CALCIUM 9.2 04/10/2022 1304   ALKPHOS 73 04/10/2022 1304   AST 16 04/10/2022 1304   ALT 14 04/10/2022 1304   BILITOT 0.4 04/10/2022 1304       RADIOGRAPHIC STUDIES:  No results found.   ASSESSMENT/PLAN:  Cesar COLESON is a 51 y.o. male with    1. Adenocarcinoma of the rectum, cT3N0/N1, stage II or III, ypT1N0 -presented with intermittent loose stool and rectal bleeding. Colonoscopy on 11/11/20 showed upper/mid rectal adenocarcinoma. -Staging work-up negative distant metastatic disease -Local staging pelvic MRI shows T3N1 disease, however the node is a single 5 mm mesorectal lymph node and could be reactive, could be N0. -s/p neoadjuvant concurrent chemo/RT with Xeloda 12/05/20 - 01/11/21.  -s/p resection and loop ileostomy on 03/10/21 under Dr. [name]. Pathology showed 2 mm residual tumor, all negative lymph  nodes (0/13).  -He completed 3 months adjuvant Xeloda on 07/09/21. He tolerated well with mild fatigue. -s/p ileostomy reversal on 08/03/21, benign pathology.  -surveillance colonoscopy on 03/20/22 with Dr. Ardis Hughs showed only diverticulosis. Recall in 2026. -Most recent CT AP 04/10/22 which showed no evidence of recurrent disease.  -he is clinically doing well. He notes some residual bowel urgency but no other disturbances. Labs from today. reviewed, WNL.  -I reviewed with Dr. Burr Medico, recommend scans once yearly, unless he is very anxious which could consider CT scan every 6 months until year 2. The patient is in agreement for repeat CT in August 2024. I will order CEAs to be drawn.  -Labs and follow up in 4 months.    2. Genetics -His genetic testing from 12/23/20 was negative for pathogenetic mutations. 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    The total time spent in the appointment was 20-29 minutes.   Taaliyah Delpriore L Emma-Lee Oddo, PA-C 08/11/22

## 2022-08-13 ENCOUNTER — Inpatient Hospital Stay: Payer: BC Managed Care – PPO | Attending: Hematology

## 2022-08-13 ENCOUNTER — Inpatient Hospital Stay (HOSPITAL_BASED_OUTPATIENT_CLINIC_OR_DEPARTMENT_OTHER): Payer: BC Managed Care – PPO | Admitting: Physician Assistant

## 2022-08-13 VITALS — BP 132/83 | HR 74 | Temp 97.8°F | Resp 15 | Wt 176.5 lb

## 2022-08-13 DIAGNOSIS — D649 Anemia, unspecified: Secondary | ICD-10-CM

## 2022-08-13 DIAGNOSIS — Z79899 Other long term (current) drug therapy: Secondary | ICD-10-CM | POA: Insufficient documentation

## 2022-08-13 DIAGNOSIS — C20 Malignant neoplasm of rectum: Secondary | ICD-10-CM

## 2022-08-13 DIAGNOSIS — Z85048 Personal history of other malignant neoplasm of rectum, rectosigmoid junction, and anus: Secondary | ICD-10-CM | POA: Insufficient documentation

## 2022-08-13 LAB — CMP (CANCER CENTER ONLY)
ALT: 13 U/L (ref 0–44)
AST: 15 U/L (ref 15–41)
Albumin: 4.3 g/dL (ref 3.5–5.0)
Alkaline Phosphatase: 75 U/L (ref 38–126)
Anion gap: 5 (ref 5–15)
BUN: 10 mg/dL (ref 6–20)
CO2: 30 mmol/L (ref 22–32)
Calcium: 9.6 mg/dL (ref 8.9–10.3)
Chloride: 104 mmol/L (ref 98–111)
Creatinine: 1.04 mg/dL (ref 0.61–1.24)
GFR, Estimated: >60 mL/min (ref >60)
Glucose, Bld: 138 mg/dL — ABNORMAL HIGH (ref 70–99)
Potassium: 4.1 mmol/L (ref 3.5–5.1)
Sodium: 139 mmol/L (ref 135–145)
Total Bilirubin: 0.5 mg/dL (ref 0.3–1.2)
Total Protein: 6.8 g/dL (ref 6.5–8.1)

## 2022-08-13 LAB — IRON AND IRON BINDING CAPACITY (CC-WL,HP ONLY)
Iron: 94 ug/dL (ref 45–182)
Saturation Ratios: 23 % (ref 17.9–39.5)
TIBC: 419 ug/dL (ref 250–450)
UIBC: 325 ug/dL (ref 117–376)

## 2022-08-13 LAB — CBC WITH DIFFERENTIAL (CANCER CENTER ONLY)
Abs Immature Granulocytes: 0.02 10*3/uL (ref 0.00–0.07)
Basophils Absolute: 0 10*3/uL (ref 0.0–0.1)
Basophils Relative: 1 %
Eosinophils Absolute: 0.1 10*3/uL (ref 0.0–0.5)
Eosinophils Relative: 2 %
HCT: 42.5 % (ref 39.0–52.0)
Hemoglobin: 14.7 g/dL (ref 13.0–17.0)
Immature Granulocytes: 1 %
Lymphocytes Relative: 13 %
Lymphs Abs: 0.5 10*3/uL — ABNORMAL LOW (ref 0.7–4.0)
MCH: 30.2 pg (ref 26.0–34.0)
MCHC: 34.6 g/dL (ref 30.0–36.0)
MCV: 87.4 fL (ref 80.0–100.0)
Monocytes Absolute: 0.4 10*3/uL (ref 0.1–1.0)
Monocytes Relative: 9 %
Neutro Abs: 3.2 10*3/uL (ref 1.7–7.7)
Neutrophils Relative %: 74 %
Platelet Count: 194 10*3/uL (ref 150–400)
RBC: 4.86 MIL/uL (ref 4.22–5.81)
RDW: 12.4 % (ref 11.5–15.5)
WBC Count: 4.3 10*3/uL (ref 4.0–10.5)
nRBC: 0 % (ref 0.0–0.2)

## 2022-08-13 LAB — FERRITIN: Ferritin: 75 ng/mL (ref 24–336)

## 2022-08-14 LAB — CEA (ACCESS): CEA (CHCC): 1.8 ng/mL (ref 0.00–5.00)

## 2022-09-07 ENCOUNTER — Other Ambulatory Visit (HOSPITAL_COMMUNITY): Payer: Self-pay

## 2022-12-05 ENCOUNTER — Other Ambulatory Visit: Payer: Self-pay | Admitting: Internal Medicine

## 2022-12-11 ENCOUNTER — Telehealth: Payer: Self-pay | Admitting: Hematology

## 2022-12-11 NOTE — Telephone Encounter (Signed)
patient called to reschedule upcoming appointments.

## 2022-12-13 ENCOUNTER — Other Ambulatory Visit: Payer: BC Managed Care – PPO

## 2022-12-13 ENCOUNTER — Ambulatory Visit: Payer: BC Managed Care – PPO | Admitting: Hematology

## 2022-12-27 ENCOUNTER — Other Ambulatory Visit: Payer: Self-pay

## 2022-12-27 ENCOUNTER — Inpatient Hospital Stay: Payer: 59 | Attending: Hematology

## 2022-12-27 ENCOUNTER — Encounter: Payer: Self-pay | Admitting: Hematology

## 2022-12-27 ENCOUNTER — Inpatient Hospital Stay (HOSPITAL_BASED_OUTPATIENT_CLINIC_OR_DEPARTMENT_OTHER): Payer: 59 | Admitting: Hematology

## 2022-12-27 VITALS — BP 120/72 | HR 78 | Temp 98.8°F | Resp 16 | Ht 68.0 in | Wt 174.4 lb

## 2022-12-27 DIAGNOSIS — Z85048 Personal history of other malignant neoplasm of rectum, rectosigmoid junction, and anus: Secondary | ICD-10-CM | POA: Diagnosis present

## 2022-12-27 DIAGNOSIS — Z79899 Other long term (current) drug therapy: Secondary | ICD-10-CM | POA: Insufficient documentation

## 2022-12-27 DIAGNOSIS — C20 Malignant neoplasm of rectum: Secondary | ICD-10-CM

## 2022-12-27 DIAGNOSIS — D649 Anemia, unspecified: Secondary | ICD-10-CM

## 2022-12-27 LAB — CMP (CANCER CENTER ONLY)
ALT: 16 U/L (ref 0–44)
AST: 16 U/L (ref 15–41)
Albumin: 4.5 g/dL (ref 3.5–5.0)
Alkaline Phosphatase: 78 U/L (ref 38–126)
Anion gap: 7 (ref 5–15)
BUN: 10 mg/dL (ref 6–20)
CO2: 29 mmol/L (ref 22–32)
Calcium: 9.7 mg/dL (ref 8.9–10.3)
Chloride: 105 mmol/L (ref 98–111)
Creatinine: 1.09 mg/dL (ref 0.61–1.24)
GFR, Estimated: >60 mL/min (ref >60)
Glucose, Bld: 120 mg/dL — ABNORMAL HIGH (ref 70–99)
Potassium: 4.5 mmol/L (ref 3.5–5.1)
Sodium: 141 mmol/L (ref 135–145)
Total Bilirubin: 0.4 mg/dL (ref 0.3–1.2)
Total Protein: 7.2 g/dL (ref 6.5–8.1)

## 2022-12-27 LAB — CBC WITH DIFFERENTIAL (CANCER CENTER ONLY)
Abs Immature Granulocytes: 0.02 10*3/uL (ref 0.00–0.07)
Basophils Absolute: 0 10*3/uL (ref 0.0–0.1)
Basophils Relative: 0 %
Eosinophils Absolute: 0.1 10*3/uL (ref 0.0–0.5)
Eosinophils Relative: 2 %
HCT: 42.5 % (ref 39.0–52.0)
Hemoglobin: 14.8 g/dL (ref 13.0–17.0)
Immature Granulocytes: 0 %
Lymphocytes Relative: 10 %
Lymphs Abs: 0.5 10*3/uL — ABNORMAL LOW (ref 0.7–4.0)
MCH: 30.6 pg (ref 26.0–34.0)
MCHC: 34.8 g/dL (ref 30.0–36.0)
MCV: 88 fL (ref 80.0–100.0)
Monocytes Absolute: 0.3 10*3/uL (ref 0.1–1.0)
Monocytes Relative: 7 %
Neutro Abs: 3.8 10*3/uL (ref 1.7–7.7)
Neutrophils Relative %: 81 %
Platelet Count: 210 10*3/uL (ref 150–400)
RBC: 4.83 MIL/uL (ref 4.22–5.81)
RDW: 12.3 % (ref 11.5–15.5)
WBC Count: 4.7 10*3/uL (ref 4.0–10.5)
nRBC: 0 % (ref 0.0–0.2)

## 2022-12-27 LAB — IRON AND IRON BINDING CAPACITY (CC-WL,HP ONLY)
Iron: 86 ug/dL (ref 45–182)
Saturation Ratios: 21 % (ref 17.9–39.5)
TIBC: 412 ug/dL (ref 250–450)
UIBC: 326 ug/dL (ref 117–376)

## 2022-12-27 NOTE — Progress Notes (Signed)
[facility]   Telephone:([phone]) [phone] [phone]   Clinic Follow up Note   Patient Care Team: Pincus Sanes, MD as PCP - General (Internal Medicine) Pollyann Samples, NP as Nurse Practitioner (Nurse Practitioner) Malachy Mood, MD as Consulting Physician (Oncology) Karie Soda, MD as Consulting Physician (General Surgery)  Date of Service:  12/27/2022  CHIEF COMPLAINT: f/u of  rectal cancer   CURRENT THERAPY:  Surveillance   ASSESSMENT:  Cesar Herrera is a 52 y.o. male with   Rectal adenocarcinoma (HCC) cT3N0/N1, stage II or III, ypT1N0 -presented with intermittent loose stool and rectal bleeding. Colonoscopy on 11/11/20 showed upper/mid rectal adenocarcinoma. -Staging work-up negative distant metastatic disease -Local staging pelvic MRI shows T3N1 disease, however the node is a single 5 mm mesorectal lymph node and could be reactive, could be N0. -s/p neoadjuvant concurrent chemo/RT with Xeloda 12/05/20 - 01/11/21.  -s/p resection and loop ileostomy on 03/10/21 under Dr. Maisie Fus. Pathology showed 2 mm residual tumor, all negative lymph nodes (0/13).  -He completed 3 months adjuvant Xeloda on 07/09/21. He tolerated well with mild fatigue. -s/p ileostomy reversal on 08/03/21, benign pathology.  -surveillance colonoscopy on 03/20/22 with Dr. Christella Hartigan showed only diverticulosis. Recall in 2026. -Surveillance CT scan from August 2023 was negative for recurrence. -Continue cancer surveillance, will repeat CT scan in 4 months.    PLAN: -lab reviewed -CMP -normal - Order CT scan in 4 months - lab and f/u in 4 months   SUMMARY OF ONCOLOGIC HISTORY: Oncology History Overview Note   Cancer Staging  Rectal adenocarcinoma (HCC) Staging form: Colon and Rectum, AJCC 8th Edition - Clinical stage from 11/23/2020: Stage IIIB (cT3, cN1, cM0) - Signed by Pollyann Samples, NP on 11/23/2020 Stage prefix: Initial diagnosis - Pathologic stage from 03/10/2021: Stage I (ypT1, pN0, cM0)  - Signed by Malachy Mood, MD on 05/31/2021 Stage prefix: Post-therapy Response to neoadjuvant therapy: Partial response Total positive nodes: 0 Histologic grading system: 4 grade system Histologic grade (G): G2 Residual tumor (R): R0 - None    Rectal adenocarcinoma  11/11/2020 Procedure   Colonoscopy by Dr. Christella Hartigan Impression - Three 2 to 5 mm polyps in the descending colon, in the transverse colon and in the cecum, removed with a cold snare. Complete resection. 2/3 polyps retrieved, sent to pathology. - Rectal tumor that appears malignant, distal edge 7cm from the anal verge. This was biopsied and then labeled with Spot submucosal tattoo. - The examination was otherwise normal on direct and retroflexion views.   11/11/2020 Initial Biopsy   Diagnosis 1. Descending Colon Polyp - TUBULAR ADENOMA (3 OF 3 FRAGMENTS) - NO HIGH-GRADE DYSPLASIA OR MALIGNANCY IDENTIFIED 2. Rectum, biopsy - ADENOCARCINOMA, AT LEAST INTRAMUCOSAL   11/11/2020 Tumor Marker   CEA: 1.4 CA 19-9: 10   11/15/2020 Imaging   Pelvic MRI for local staging IMPRESSION: Signs of potential T3 B disease, tortuosity of the rectum at the level of the tumor makes assessment difficult, distorted by the polypoid eccentric tumor in the high rectum as described.   N1 disease.   No extramural venous invasion. Tumor extends just to the level of the APR in this center below this level.   11/17/2020 Imaging   CT CAP IMPRESSION: 1. Known rectal mass with a 5 mm in short axis left mesorectal lymph node better shown on the prior exam and only faintly apparent on the CT. 2. 1.4 by 1.2 by 0.9 cm hypodense lesion in the right hepatic lobe on image 50  of series 2, poorly seen on the delayed images. This lesion is indeterminate by CT and could be benign or malignant. Imaging possibilities for with further workup might include hepatic protocol MRI with and without contrast or PET-CT. 3. Lucent lesion of the left posterior L2 vertebral body  potentially extending slightly into the pedicle, measuring 1.9 by 1.4 cm on image 116 of series 5. This previously measured 1.6 by 1.3 cm on 03/28/2010, accordingly making it unlikely to be a metastatic lesion. 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 4. Small right middle lobe and right lower lobe pulmonary nodules are likely benign but merit surveillance in this context.   11/18/2020 Initial Diagnosis   Rectal adenocarcinoma (HCC)   11/23/2020 Cancer Staging   Staging form: Colon and Rectum, AJCC 8th Edition - Clinical stage from 11/23/2020: Stage IIIB (cT3, cN1, cM0) - Signed by Pollyann Samples, NP on 11/23/2020 Stage prefix: Initial diagnosis   12/02/2020 Imaging   MRI Liver  IMPRESSION: Tiny benign hemangioma in the right hepatic lobe, which corresponds to the lesion seen on recent CT. No evidence of metastatic disease within the liver or abdomen.   12/05/2020 - 01/11/2021 Radiation Therapy   Concurrent chemoradiation with Dr Mitzi Hansen    12/05/2020 - 01/11/2021 Chemotherapy   Concurrent chemoradiation with Xeloda  in the AM and  in the PM M-F on days of Radiation   12/23/2020 Genetic Testing   Negative genetic testing:  No pathogenic variants detected on the [employer] CancerNext-Expanded + RNAinsight panel. 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 The report date is 12/23/2020.  The CancerNext-Expanded + RNAinsight gene panel offered by [employer] and includes sequencing and rearrangement analysis for the following 77 genes: AIP, ALK, APC, ATM, AXIN2, BAP1, BARD1, BLM, BMPR1A, BRCA1, BRCA2, BRIP1, CDC73, CDH1, CDK4, CDKN1B, CDKN2A, CHEK2, CTNNA1, DICER1, FANCC, FH, FLCN, GALNT12, KIF1B, LZTR1, MAX, MEN1, MET, MLH1, MSH2, MSH3, MSH6, MUTYH, NBN, NF1, NF2, NTHL1, PALB2, PHOX2B, PMS2, POT1, PRKAR1A, PTCH1, PTEN, RAD51C, RAD51D, RB1, RECQL, RET, SDHA, SDHAF2, SDHB, SDHC,  SDHD, SMAD4, SMARCA4, SMARCB1, SMARCE1, STK11, SUFU, TMEM127, TP53, TSC1, TSC2, VHL and XRCC2 (sequencing and deletion/duplication); EGFR, EGLN1, HOXB13, KIT, MITF, PDGFRA, POLD1 and POLE (sequencing only); EPCAM and GREM1 (deletion/duplication only). RNA data is routinely analyzed for use in variant interpretation for all genes.    03/10/2021 Surgery   XI ROBOTIC ASSISTED LOWER ANTERIOR RESECTION WITH LOOP ILEOSTOMY, Dr. Maisie Fus  FINAL MICROSCOPIC DIAGNOSIS:   A. COLON, RECTOSIGMOID, RESECTION:  - Invasive adenocarcinoma, moderately differentiated, spanning 2 mm.  - Tumor invades the submucosa.  - Resection margins are negative.  - No carcinoma in thirteen of thirteen lymph nodes (0/13).  - See oncology table.   B. FINAL DISTAL MARGIN:  - Benign colonic mucosa.  - No dysplasia or malignancy.    03/10/2021 Cancer Staging   Staging form: Colon and Rectum, AJCC 8th Edition - Pathologic stage from 03/10/2021: Stage I (ypT1, pN0, cM0) - Signed by Malachy Mood, MD on 05/31/2021 Stage prefix: Post-therapy Response to neoadjuvant therapy: Partial response Total positive nodes: 0 Histologic grading system: 4 grade system Histologic grade (G): G2 Residual tumor (R): R0 - None   07/18/2021 Imaging   EXAM: CT CHEST, ABDOMEN, AND PELVIS WITH CONTRAST  IMPRESSION: 1. Postsurgical changes of low anterior resection and loop ileostomy formation with edema/soft tissue thickening along the presacral space measuring up to 12 mm in thickness,  favored to reflect postsurgical/post treatment change. Attention on follow-up exams is recommended. 2. No evidence of metastatic disease within the chest, abdomen, or pelvis. 3. Stable small right middle lobe and right lower lobe pulmonary nodules, likely benign. 4. Stable 1.4 cm benign hepatic hemangioma.   10/16/2021 Imaging   EXAM: CT ABDOMEN AND PELVIS WITH CONTRAST  IMPRESSION: 1. Postsurgical change of low anterior resection with interval loop ileostomy  takedown and similar, likely post treatment/surgical related, presacral soft tissue thickening. No evidence of new or progressive disease in the chest abdomen or pelvis to suggest recurrence or metastatic disease. 2. Stable tiny right middle lobe pulmonary nodules measuring up to 4 mm, favored benign. Continued attention on follow-up imaging suggested. 3. Stable benign 1.3 cm hepatic hemangioma. 4. 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      INTERVAL HISTORY:  Cesar Herrera is here for a follow up of  rectal cancer. He was last seen by me on 08/13/2022. He presents to the clinic accompanied by wife. Pt state that he feels well Pt denied having any stomach issues.Pt state he has occasional diarrhea due to just trying to find what agree with him. Pt state he takes Imodium, bur he take     All other systems were reviewed with the patient and are negative.  MEDICAL HISTORY:  Past Medical History:  Diagnosis Date   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   Family history of bladder cancer    Family history of lymphoma    H/O epididymitis 2011   PONV (postoperative nausea and vomiting)    Seasonal allergies     SURGICAL HISTORY: Past Surgical History:  Procedure Laterality Date   COLONOSCOPY  11/11/2020   HERNIA REPAIR N/A 02/22/2022   repaoired five hernias   ILEOSTOMY CLOSURE N/A 08/03/2021   Procedure: ILEOSTOMY CLOSURE;  Surgeon: Romie Levee, MD;  Location: [facility];  Service: General;  Laterality: N/A;   INGUINAL HERNIA REPAIR Bilateral 02/22/2022   Procedure: LAPAROSCOPIC BILATERAL INGUINAL HERNIA REPAIR WITH MESH, BILATERAL FEMORAL HERNIA REPAIR, LYSIS OF ADHESIONS, AND BILATERAL TAP BLOCK;  Surgeon: Karie Soda, MD;  Location: [facility];  Service: General;  Laterality: Bilateral;   LAPAROSCOPY N/A 08/03/2021   Procedure: LAPAROSCOPY DIAGNOSTIC;  Surgeon: Romie Levee, MD;  Location: [facility];  Service: General;  Laterality: N/A;   VASECTOMY     VENTRAL HERNIA REPAIR  N/A 02/22/2022   Procedure: LAPAROSCOPIC VENTRAL WALL HERNIA REPAIR WITH MESH;  Surgeon: Karie Soda, MD;  Location: [facility];  Service: General;  Laterality: N/A;   WISDOM TOOTH EXTRACTION     XI ROBOTIC ASSISTED LOWER ANTERIOR RESECTION N/A 03/10/2021   Procedure: XI ROBOTIC ASSISTED LOWER ANTERIOR RESECTION WITH LOOP ILEOSTOMY;  Surgeon: Romie Levee, MD;  Location: [facility];  Service: General;  Laterality: N/A;    I have reviewed the social history and family history with the patient and they are unchanged from previous note.  ALLERGIES:  is allergic to erythromycin and penicillins.  MEDICATIONS:  Current Outpatient Medications  Medication Sig Dispense Refill   acetaminophen (TYLENOL) 500 MG tablet Take 500-1,000 mg by mouth every 6 (six) hours as needed (for pain.).     ibuprofen (ADVIL) 200 MG tablet Take 200-400 mg by mouth every 8 (eight) hours as needed (pain).     loperamide (IMODIUM) 2 MG capsule Take 1 capsule (2 mg total) by mouth as needed for diarrhea or loose stools. Once abd bloating has improved 120 capsule 3   Multiple Vitamin (MULTIVITAMIN WITH MINERALS)  TABS tablet Take 1 tablet by mouth 3 (three) times a week.     Polyethyl Glycol-Propyl Glycol (LUBRICANT EYE DROPS) 0.4-0.3 % SOLN Place 1-2 drops into both eyes 3 (three) times daily as needed (burning/irritated/dry eyes.).     rosuvastatin (CRESTOR) 5 MG tablet Take 1 tablet (5 mg total) by mouth daily. (Patient not taking: Reported on 08/13/2022) 90 tablet 3   tadalafil (CIALIS) 5 MG tablet TAKE 1-2 TABLETS (5-10 MG TOTAL) BY MOUTH DAILY AS NEEDED FOR ERECTILE DYSFUNCTION. 20 tablet 11   No current facility-administered medications for this visit.    PHYSICAL EXAMINATION: ECOG PERFORMANCE STATUS: 0 - Asymptomatic  Vitals:   12/27/22 1401  BP: 120/72  Pulse: 78  Resp: 16  Temp: 98.8 F (37.1 C)  SpO2: 98%   Wt Readings from Last 3 Encounters:  12/27/22 174 lb 6.4 oz (79.1 kg)  08/13/22 176 lb 8 oz (80.1  kg)  04/13/22 172 lb (78 kg)     GENERAL:alert, no distress and comfortable SKIN: skin color normal, no rashes or significant lesions EYES: normal, Conjunctiva are pink and non-injected, sclera clear  NEURO: alert & oriented x 3 with fluent speech NECK:(-) supple, thyroid normal size, non-tender, without nodularity LYMPH:(-)   no palpable lymphadenopathy in the cervical, axillary  ABDOMEN:(-) abdomen soft, (-) non-tender and(-) normal bowel sounds  LABORATORY DATA:  I have reviewed the data as listed    Latest Ref Rng & Units 12/27/2022    1:29 PM 08/13/2022    2:02 PM 04/10/2022    1:04 PM  CBC  WBC 4.0 - 10.5 K/uL 4.7  4.3  4.7   Hemoglobin 13.0 - 17.0 g/dL 16.1  09.6  04.5   Hematocrit 39.0 - 52.0 % 42.5  42.5  40.6   Platelets 150 - 400 K/uL 210  194  223         Latest Ref Rng & Units 12/27/2022    1:29 PM 08/13/2022    2:02 PM 04/10/2022    1:04 PM  CMP  Glucose 70 - 99 mg/dL 409  811  914   BUN 6 - 20 mg/dL 10  10  12    Creatinine 0.61 - 1.24 mg/dL 7.82  9.56  2.13   Sodium 135 - 145 mmol/L 141  139  139   Potassium 3.5 - 5.1 mmol/L 4.5  4.1  4.0   Chloride 98 - 111 mmol/L 105  104  104   CO2 22 - 32 mmol/L 29  30  30    Calcium 8.9 - 10.3 mg/dL 9.7  9.6  9.2   Total Protein 6.5 - 8.1 g/dL 7.2  6.8  7.3   Total Bilirubin 0.3 - 1.2 mg/dL 0.4  0.5  0.4   Alkaline Phos 38 - 126 U/L 78  75  73   AST 15 - 41 U/L 16  15  16    ALT 0 - 44 U/L 16  13  14        RADIOGRAPHIC STUDIES: I have personally reviewed the radiological images as listed and agreed with the findings in the report. No results found.    Orders Placed This Encounter  Procedures   CT CHEST ABDOMEN PELVIS W CONTRAST    Standing Status:   Future    Standing Expiration Date:   04/28/2023    Order Specific Question:   If indicated for the ordered procedure, I authorize the administration of contrast media per Radiology protocol    Answer:   Yes  Order Specific Question:   Does the patient have a  contrast media/X-ray dye allergy?    Answer:   No    Order Specific Question:   Preferred imaging location?    Answer:   [facility]    Order Specific Question:   If indicated for the ordered procedure, I authorize the administration of oral contrast media per Radiology protocol    Answer:   Yes   All questions were answered. The patient knows to call the clinic with any problems, questions or concerns. No barriers to learning was detected. The total time spent in the appointment was 25 minutes.     Malachy Mood, MD 12/27/2022   Carolin Coy, CMA, am acting as scribe for Malachy Mood, MD.   I have reviewed the above documentation for accuracy and completeness, and I agree with the above.

## 2022-12-27 NOTE — Assessment & Plan Note (Signed)
cT3N0/N1, stage II or III, ypT1N0 -presented with intermittent loose stool and rectal bleeding. Colonoscopy on 11/11/20 showed upper/mid rectal adenocarcinoma. -Staging work-up negative distant metastatic disease -Local staging pelvic MRI shows T3N1 disease, however the node is a single 5 mm mesorectal lymph node and could be reactive, could be N0. -s/p neoadjuvant concurrent chemo/RT with Xeloda 12/05/20 - 01/11/21.  -s/p resection and loop ileostomy on 03/10/21 under Dr. Maisie Fus. Pathology showed 2 mm residual tumor, all negative lymph nodes (0/13).  -He completed 3 months adjuvant Xeloda on 07/09/21. He tolerated well with mild fatigue. -s/p ileostomy reversal on 08/03/21, benign pathology.  -surveillance colonoscopy on 03/20/22 with Dr. Christella Hartigan showed only diverticulosis. Recall in 2026. -Surveillance CT scan from August 2023 was negative for recurrence. -Continue cancer surveillance, will repeat CT scan in 4 months.

## 2022-12-28 LAB — FERRITIN: Ferritin: 57 ng/mL (ref 24–336)

## 2022-12-28 LAB — CEA (ACCESS): CEA (CHCC): 1.64 ng/mL (ref 0.00–5.00)

## 2023-04-24 ENCOUNTER — Other Ambulatory Visit: Payer: Self-pay

## 2023-04-24 DIAGNOSIS — C20 Malignant neoplasm of rectum: Secondary | ICD-10-CM

## 2023-04-25 ENCOUNTER — Inpatient Hospital Stay: Payer: 59 | Attending: Hematology

## 2023-04-25 ENCOUNTER — Encounter (HOSPITAL_COMMUNITY): Payer: Self-pay

## 2023-04-25 ENCOUNTER — Ambulatory Visit (HOSPITAL_COMMUNITY)
Admission: RE | Admit: 2023-04-25 | Discharge: 2023-04-25 | Disposition: A | Discharge status: Home / Self Care | Payer: 59 | Source: Ambulatory Visit | Attending: Hematology | Admitting: Hematology

## 2023-04-25 DIAGNOSIS — C20 Malignant neoplasm of rectum: Secondary | ICD-10-CM | POA: Insufficient documentation

## 2023-04-25 DIAGNOSIS — Z08 Encounter for follow-up examination after completed treatment for malignant neoplasm: Secondary | ICD-10-CM | POA: Insufficient documentation

## 2023-04-25 DIAGNOSIS — Z85048 Personal history of other malignant neoplasm of rectum, rectosigmoid junction, and anus: Secondary | ICD-10-CM | POA: Insufficient documentation

## 2023-04-25 LAB — CBC WITH DIFFERENTIAL (CANCER CENTER ONLY)
Abs Immature Granulocytes: 0.02 10*3/uL (ref 0.00–0.07)
Basophils Absolute: 0 10*3/uL (ref 0.0–0.1)
Basophils Relative: 1 %
Eosinophils Absolute: 0.1 10*3/uL (ref 0.0–0.5)
Eosinophils Relative: 2 %
HCT: 42 % (ref 39.0–52.0)
Hemoglobin: 14.9 g/dL (ref 13.0–17.0)
Immature Granulocytes: 1 %
Lymphocytes Relative: 17 %
Lymphs Abs: 0.6 10*3/uL — ABNORMAL LOW (ref 0.7–4.0)
MCH: 31.2 pg (ref 26.0–34.0)
MCHC: 35.5 g/dL (ref 30.0–36.0)
MCV: 87.9 fL (ref 80.0–100.0)
Monocytes Absolute: 0.3 10*3/uL (ref 0.1–1.0)
Monocytes Relative: 8 %
Neutro Abs: 2.7 10*3/uL (ref 1.7–7.7)
Neutrophils Relative %: 71 %
Platelet Count: 185 10*3/uL (ref 150–400)
RBC: 4.78 MIL/uL (ref 4.22–5.81)
RDW: 12.1 % (ref 11.5–15.5)
WBC Count: 3.8 10*3/uL — ABNORMAL LOW (ref 4.0–10.5)
nRBC: 0 % (ref 0.0–0.2)

## 2023-04-25 LAB — CMP (CANCER CENTER ONLY)
ALT: 18 U/L (ref 0–44)
AST: 18 U/L (ref 15–41)
Albumin: 4.1 g/dL (ref 3.5–5.0)
Alkaline Phosphatase: 62 U/L (ref 38–126)
Anion gap: 8 (ref 5–15)
BUN: 14 mg/dL (ref 6–20)
CO2: 26 mmol/L (ref 22–32)
Calcium: 9.4 mg/dL (ref 8.9–10.3)
Chloride: 104 mmol/L (ref 98–111)
Creatinine: 0.94 mg/dL (ref 0.61–1.24)
GFR, Estimated: >60 mL/min (ref >60)
Glucose, Bld: 100 mg/dL — ABNORMAL HIGH (ref 70–99)
Potassium: 4.7 mmol/L (ref 3.5–5.1)
Sodium: 138 mmol/L (ref 135–145)
Total Bilirubin: 0.6 mg/dL (ref 0.3–1.2)
Total Protein: 7.4 g/dL (ref 6.5–8.1)

## 2023-04-25 LAB — IRON AND IRON BINDING CAPACITY (CC-WL,HP ONLY)
Iron: 90 ug/dL (ref 45–182)
Saturation Ratios: 21 % (ref 17.9–39.5)
TIBC: 426 ug/dL (ref 250–450)
UIBC: 336 ug/dL (ref 117–376)

## 2023-04-25 LAB — FERRITIN: Ferritin: 70 ng/mL (ref 24–336)

## 2023-04-25 MED ORDER — IOHEXOL 9 MG/ML PO SOLN: 1000.0000 mL | ORAL | Status: DC

## 2023-04-25 MED ORDER — IOHEXOL 9 MG/ML PO SOLN
1000.0000 mL | Freq: Once | ORAL | Status: AC
  Administered 2023-04-25: 1000 mL via ORAL

## 2023-04-25 MED ORDER — IOHEXOL 300 MG/ML  SOLN
100.0000 mL | Freq: Once | INTRAMUSCULAR | Status: AC | PRN
  Administered 2023-04-25: 100 mL via INTRAVENOUS

## 2023-05-01 ENCOUNTER — Encounter: Payer: Self-pay | Admitting: Hematology

## 2023-05-01 ENCOUNTER — Encounter: Payer: Self-pay | Admitting: Internal Medicine

## 2023-05-01 ENCOUNTER — Inpatient Hospital Stay (HOSPITAL_BASED_OUTPATIENT_CLINIC_OR_DEPARTMENT_OTHER): Payer: 59 | Admitting: Hematology

## 2023-05-01 VITALS — BP 128/73 | HR 88 | Temp 98.3°F | Resp 15 | Ht 68.0 in | Wt 174.6 lb

## 2023-05-01 DIAGNOSIS — C20 Malignant neoplasm of rectum: Secondary | ICD-10-CM | POA: Diagnosis not present

## 2023-05-01 DIAGNOSIS — Z08 Encounter for follow-up examination after completed treatment for malignant neoplasm: Secondary | ICD-10-CM | POA: Diagnosis present

## 2023-05-01 DIAGNOSIS — Z85048 Personal history of other malignant neoplasm of rectum, rectosigmoid junction, and anus: Secondary | ICD-10-CM | POA: Diagnosis not present

## 2023-05-01 NOTE — Patient Instructions (Addendum)
Blood work was ordered.   The lab is on the first floor.    Medications changes include :   none     Return in about 1 year (around 05/01/2024) for Physical Exam.    Health Maintenance, Male Adopting a healthy lifestyle and getting preventive care are important in promoting health and wellness. Ask your health care provider about: The right schedule for you to have regular tests and exams. Things you can do on your own to prevent diseases and keep yourself healthy. What should I know about diet, weight, and exercise? Eat a healthy diet  Eat a diet that includes plenty of vegetables, fruits, low-fat dairy products, and lean protein. Do not eat a lot of foods that are high in solid fats, added sugars, or sodium. Maintain a healthy weight Body mass index (BMI) is a measurement that can be used to identify possible weight problems. It estimates body fat based on height and weight. Your health care provider can help determine your BMI and help you achieve or maintain a healthy weight. Get regular exercise Get regular exercise. This is one of the most important things you can do for your health. Most adults should: Exercise for at least 150 minutes each week. The exercise should increase your heart rate and make you sweat (moderate-intensity exercise). Do strengthening exercises at least twice a week. This is in addition to the moderate-intensity exercise. Spend less time sitting. Even light physical activity can be beneficial. Watch cholesterol and blood lipids Have your blood tested for lipids and cholesterol at 51 years of age, then have this test every 5 years. You may need to have your cholesterol levels checked more often if: Your lipid or cholesterol levels are high. You are older than 51 years of age. You are at high risk for heart disease. What should I know about cancer screening? Many types of cancers can be detected early and may often be prevented. Depending on  your health history and family history, you may need to have cancer screening at various ages. This may include screening for: Colorectal cancer. Prostate cancer. Skin cancer. Lung cancer. What should I know about heart disease, diabetes, and high blood pressure? Blood pressure and heart disease High blood pressure causes heart disease and increases the risk of stroke. This is more likely to develop in people who have high blood pressure readings or are overweight. Talk with your health care provider about your target blood pressure readings. Have your blood pressure checked: Every 3-5 years if you are 87-27 years of age. Every year if you are 64 years old or older. If you are between the ages of 37 and 25 and are a current or former smoker, ask your health care provider if you should have a one-time screening for abdominal aortic aneurysm (AAA). Diabetes Have regular diabetes screenings. This checks your fasting blood sugar level. Have the screening done: Once every three years after age 60 if you are at a normal weight and have a low risk for diabetes. More often and at a younger age if you are overweight or have a high risk for diabetes. What should I know about preventing infection? Hepatitis B If you have a higher risk for hepatitis B, you should be screened for this virus. Talk with your health care provider to find out if you are at risk for hepatitis B infection. Hepatitis C Blood testing is recommended for: Everyone born from [birth year] through 1964. Anyone with known  risk factors for hepatitis C. Sexually transmitted infections (STIs) You should be screened each year for STIs, including gonorrhea and chlamydia, if: You are sexually active and are younger than 51 years of age. You are older than 51 years of age and your health care provider tells you that you are at risk for this type of infection. Your sexual activity has changed since you were last screened, and you are at increased  risk for chlamydia or gonorrhea. Ask your health care provider if you are at risk. Ask your health care provider about whether you are at high risk for HIV. Your health care provider may recommend a prescription medicine to help prevent HIV infection. If you choose to take medicine to prevent HIV, you should first get tested for HIV. You should then be tested every 3 months for as long as you are taking the medicine. Follow these instructions at home: Alcohol use Do not drink alcohol if your health care provider tells you not to drink. If you drink alcohol: Limit how much you have to 0-2 drinks a day. Know how much alcohol is in your drink. In the [place] one drink equals one 12 oz bottle of beer (355 mL), one 5 oz glass of wine (148 mL), or one 1 oz glass of hard liquor (44 mL). Lifestyle Do not use any products that contain nicotine or tobacco. These products include cigarettes, chewing tobacco, and vaping devices, such as e-cigarettes. If you need help quitting, ask your health care provider. Do not use street drugs. Do not share needles. Ask your health care provider for help if you need support or information about quitting drugs. General instructions Schedule regular health, dental, and eye exams. Stay current with your vaccines. Tell your health care provider if: You often feel depressed. You have ever been abused or do not feel safe at home. Summary Adopting a healthy lifestyle and getting preventive care are important in promoting health and wellness. Follow your health care provider's instructions about healthy diet, exercising, and getting tested or screened for diseases. Follow your health care provider's instructions on monitoring your cholesterol and blood pressure. This information is not intended to replace advice given to you by your health care provider. Make sure you discuss any questions you have with your health care provider. Document Revised: 01/09/2021 Document  Reviewed: 01/09/2021 Elsevier Patient Education  2024 [employer].

## 2023-05-01 NOTE — Progress Notes (Unsigned)
Subjective:    Patient ID: Cesar Herrera, male    DOB: 1971/05/18, 52 y.o.   MRN: [id number]     HPI Cesar Herrera is here for a physical exam and his chronic medical problems.   No evidence of recurrence of his cancer.  Some thickening of bladder - ? From radiation.    Bowels back and forth - constipation, diarrhea.     Medications and allergies reviewed with patient and updated if appropriate.  Current Outpatient Medications on File Prior to Visit  Medication Sig Dispense Refill   acetaminophen (TYLENOL) 500 MG tablet Take 500-1,000 mg by mouth every 6 (six) hours as needed (for pain.).     loperamide (IMODIUM) 2 MG capsule Take 1 capsule (2 mg total) by mouth as needed for diarrhea or loose stools. Once abd bloating has improved 120 capsule 3   Multiple Vitamin (MULTIVITAMIN WITH MINERALS) TABS tablet Take 1 tablet by mouth 3 (three) times a week.     Polyethyl Glycol-Propyl Glycol (LUBRICANT EYE DROPS) 0.4-0.3 % SOLN Place 1-2 drops into both eyes 3 (three) times daily as needed (burning/irritated/dry eyes.).     tadalafil (CIALIS) 5 MG tablet TAKE 1-2 TABLETS (5-10 MG TOTAL) BY MOUTH DAILY AS NEEDED FOR ERECTILE DYSFUNCTION. 20 tablet 11   No current facility-administered medications on file prior to visit.    Review of Systems  Constitutional:  Negative for fever.  Eyes:  Negative for visual disturbance.  Respiratory:  Negative for cough, shortness of breath and wheezing.   Cardiovascular:  Negative for chest pain, palpitations and leg swelling.  Gastrointestinal:  Positive for constipation and diarrhea. Negative for abdominal pain, blood in stool and nausea.       No gerd  Genitourinary:  Negative for difficulty urinating, dysuria and hematuria.  Musculoskeletal:  Positive for arthralgias (knee - old injury). Negative for back pain.  Skin:  Negative for rash.  Neurological:  Negative for light-headedness and headaches.  Psychiatric/Behavioral:  Negative for dysphoric  mood and sleep disturbance. The patient is not nervous/anxious.        Objective:   Vitals:   05/02/23 0926  BP: 120/74  Pulse: 77  Temp: 98.4 F (36.9 C)  SpO2: 98%   Filed Weights   05/02/23 0926  Weight: 174 lb (78.9 kg)   Body mass index is 26.46 kg/m.  BP Readings from Last 3 Encounters:  05/02/23 120/74  05/01/23 128/73  12/27/22 120/72    Wt Readings from Last 3 Encounters:  05/02/23 174 lb (78.9 kg)  05/01/23 174 lb 9.6 oz (79.2 kg)  12/27/22 174 lb 6.4 oz (79.1 kg)      Physical Exam Constitutional: He appears well-developed and well-nourished. No distress.  HENT:  Head: Normocephalic and atraumatic.  Right Ear: External ear normal.  Left Ear: External ear normal.  Normal ear canals and TM b/l  Mouth/Throat: Oropharynx is clear and moist. Eyes: Conjunctivae and EOM are normal.  Neck: Neck supple. No tracheal deviation present. No thyromegaly present.  No carotid bruit  Cardiovascular: Normal rate, regular rhythm, normal heart sounds and intact distal pulses.   No murmur heard.  No lower extremity edema. Pulmonary/Chest: Effort normal and breath sounds normal. No respiratory distress. He has no wheezes. He has no rales.  Abdominal: Soft. He exhibits no distension. There is no tenderness.  Genitourinary: deferred  Lymphadenopathy:   He has no cervical adenopathy.  Skin: Skin is warm and dry. He is not diaphoretic.  Psychiatric: He has a  normal mood and affect. His behavior is normal.         Assessment & Plan:   Physical exam: Screening blood work  ordered Exercise   walking 1-2 times a day, hiking Weight  is good Substance abuse   none Eating healthy - does not eat processed foods.   Reviewed recommended immunizations.   Health Maintenance  Topic Date Due   Zoster Vaccines- Shingrix (1 of 2) Never done   COVID-19 Vaccine (4 - 2023-24 season) 05/04/2022   INFLUENZA VACCINE  12/02/2023 (Originally 04/04/2023)   Colonoscopy  03/30/2025    DTaP/Tdap/Td (3 - Td or Tdap) 09/08/2029   HIV Screening  Completed   HPV VACCINES  Aged Out   Hepatitis C Screening  Discontinued     See Problem List for Assessment and Plan of chronic medical problems.

## 2023-05-01 NOTE — Progress Notes (Signed)
[facility]   [phone] [phone]   Clinic Follow up Note   Patient Care Team: Pincus Sanes, MD as PCP - General (Internal Medicine) Pollyann Samples, NP as Nurse Practitioner (Nurse Practitioner) Malachy Mood, MD as Consulting Physician (Oncology) Karie Soda, MD as Consulting Physician (General Surgery)  Date of Service:  05/01/2023  CHIEF COMPLAINT: f/u of  rectal cancer   CURRENT THERAPY:  Surveillance   ASSESSMENT:  Cesar Herrera is a 52 y.o. male with   Rectal adenocarcinoma (HCC) cT3N0/N1, stage II or III, ypT1N0 -presented with intermittent loose stool and rectal bleeding. Colonoscopy on 11/11/20 showed upper/mid rectal adenocarcinoma. -Staging work-up negative distant metastatic disease -Local staging pelvic MRI shows T3N1 disease, however the node is a single 5 mm mesorectal lymph node and could be reactive, could be N0. -s/p neoadjuvant concurrent chemo/RT with Xeloda 12/05/20 - 01/11/21.  -s/p resection and loop ileostomy on 03/10/21 under Dr. Maisie Fus. Pathology showed 2 mm residual tumor, all negative lymph nodes (0/13).  -He completed 3 months adjuvant Xeloda on 07/09/21. He tolerated well with mild fatigue. -s/p ileostomy reversal on 08/03/21, benign pathology.  -surveillance colonoscopy on 03/20/22 with Dr. Christella Hartigan showed only diverticulosis. Recall in 2026. -Surveillance CT scan from August 2023 was negative for recurrence. -I personally reviewed his surveillance CT scan from April 25, 2023, which showed no definite evidence of recurrence.  He has postsurgical change of LAR, stable presacral and perirectal soft tissue likely related to surgery.  CT scan also showed apical wall thickening of nondistended urinary bladder, which appeared to be similar to last CT to my eyes.  Patient denies any urinary symptoms.  Will continue monitoring with CT.   PLAN: -lab reviewed -I personally reviewed his surveillance CT scan images with patient and  his wife, NED -Continue cancer surveillance, will see him back in 6 months.  Plan to repeat a CT scan in 1 year.   SUMMARY OF ONCOLOGIC HISTORY: Oncology History Overview Note   Cancer Staging  Rectal adenocarcinoma [facility]) Staging form: Colon and Rectum, AJCC 8th Edition - Clinical stage from 11/23/2020: Stage IIIB (cT3, cN1, cM0) - Signed by Pollyann Samples, NP on 11/23/2020 Stage prefix: Initial diagnosis - Pathologic stage from 03/10/2021: Stage I (ypT1, pN0, cM0) - Signed by Malachy Mood, MD on 05/31/2021 Stage prefix: Post-therapy Response to neoadjuvant therapy: Partial response Total positive nodes: 0 Histologic grading system: 4 grade system Histologic grade (G): G2 Residual tumor (R): R0 - None    Rectal adenocarcinoma (HCC)  11/11/2020 Procedure   Colonoscopy by Dr. Christella Hartigan Impression - Three 2 to 5 mm polyps in the descending colon, in the transverse colon and in the cecum, removed with a cold snare. Complete resection. 2/3 polyps retrieved, sent to pathology. - Rectal tumor that appears malignant, distal edge 7cm from the anal verge. This was biopsied and then labeled with Spot submucosal tattoo. - The examination was otherwise normal on direct and retroflexion views.   11/11/2020 Initial Biopsy   Diagnosis 1. Descending Colon Polyp - TUBULAR ADENOMA (3 OF 3 FRAGMENTS) - NO HIGH-GRADE DYSPLASIA OR MALIGNANCY IDENTIFIED 2. Rectum, biopsy - ADENOCARCINOMA, AT LEAST INTRAMUCOSAL   11/11/2020 Tumor Marker   CEA: 1.4 CA 19-9: 10   11/15/2020 Imaging   Pelvic MRI for local staging IMPRESSION: Signs of potential T3 B disease, tortuosity of the rectum at the level of the tumor makes assessment difficult, distorted by the polypoid eccentric tumor in the high rectum as described.  N1 disease.   No extramural venous invasion. Tumor extends just to the level of the APR in this center below this level.   11/17/2020 Imaging   CT CAP IMPRESSION: 1. Known rectal mass with a 5 mm  in short axis left mesorectal lymph node better shown on the prior exam and only faintly apparent on the CT. 2. 1.4 by 1.2 by 0.9 cm hypodense lesion in the right hepatic lobe on image 50 of series 2, poorly seen on the delayed images. This lesion is indeterminate by CT and could be benign or malignant. Imaging possibilities for with further workup might include hepatic protocol MRI with and without contrast or PET-CT. 3. Lucent lesion of the left posterior L2 vertebral body potentially extending slightly into the pedicle, measuring 1.9 by 1.4 cm on image 116 of series 5. This previously measured 1.6 by 1.3 cm on 03/28/2010, accordingly making it unlikely to be a metastatic lesion. 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 4. Small right middle lobe and right lower lobe pulmonary nodules are likely benign but merit surveillance in this context.   11/18/2020 Initial Diagnosis   Rectal adenocarcinoma (HCC)   11/23/2020 Cancer Staging   Staging form: Colon and Rectum, AJCC 8th Edition - Clinical stage from 11/23/2020: Stage IIIB (cT3, cN1, cM0) - Signed by Pollyann Samples, NP on 11/23/2020 Stage prefix: Initial diagnosis   12/02/2020 Imaging   MRI Liver  IMPRESSION: Tiny benign hemangioma in the right hepatic lobe, which corresponds to the lesion seen on recent CT. No evidence of metastatic disease within the liver or abdomen.   12/05/2020 - 01/11/2021 Radiation Therapy   Concurrent chemoradiation with Dr Mitzi Hansen    12/05/2020 - 01/11/2021 Chemotherapy   Concurrent chemoradiation with Xeloda 2000mg  in the AM and 1500mg  in the PM M-F on days of Radiation   12/23/2020 Genetic Testing   Negative genetic testing:  No pathogenic variants detected on the [employer] CancerNext-Expanded + RNAinsight panel. 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 The report date is 12/23/2020.  The  CancerNext-Expanded + RNAinsight gene panel offered by [employer] and includes sequencing and rearrangement analysis for the following 77 genes: AIP, ALK, APC, ATM, AXIN2, BAP1, BARD1, BLM, BMPR1A, BRCA1, BRCA2, BRIP1, CDC73, CDH1, CDK4, CDKN1B, CDKN2A, CHEK2, CTNNA1, DICER1, FANCC, FH, FLCN, GALNT12, KIF1B, LZTR1, MAX, MEN1, MET, MLH1, MSH2, MSH3, MSH6, MUTYH, NBN, NF1, NF2, NTHL1, PALB2, PHOX2B, PMS2, POT1, PRKAR1A, PTCH1, PTEN, RAD51C, RAD51D, RB1, RECQL, RET, SDHA, SDHAF2, SDHB, SDHC, SDHD, SMAD4, SMARCA4, SMARCB1, SMARCE1, STK11, SUFU, TMEM127, TP53, TSC1, TSC2, VHL and XRCC2 (sequencing and deletion/duplication); EGFR, EGLN1, HOXB13, KIT, MITF, PDGFRA, POLD1 and POLE (sequencing only); EPCAM and GREM1 (deletion/duplication only). RNA data is routinely analyzed for use in variant interpretation for all genes.    03/10/2021 Surgery   XI ROBOTIC ASSISTED LOWER ANTERIOR RESECTION WITH LOOP ILEOSTOMY, Dr. Maisie Fus  FINAL MICROSCOPIC DIAGNOSIS:   A. COLON, RECTOSIGMOID, RESECTION:  - Invasive adenocarcinoma, moderately differentiated, spanning 2 mm.  - Tumor invades the submucosa.  - Resection margins are negative.  - No carcinoma in thirteen of thirteen lymph nodes (0/13).  - See oncology table.   B. FINAL DISTAL MARGIN:  - Benign colonic mucosa.  - No dysplasia or malignancy.    03/10/2021 Cancer Staging   Staging form: Colon and Rectum, AJCC 8th Edition - Pathologic stage from 03/10/2021: Stage I (ypT1, pN0, cM0) - Signed  by Malachy Mood, MD on 05/31/2021 Stage prefix: Post-therapy Response to neoadjuvant therapy: Partial response Total positive nodes: 0 Histologic grading system: 4 grade system Histologic grade (G): G2 Residual tumor (R): R0 - None   07/18/2021 Imaging   EXAM: CT CHEST, ABDOMEN, AND PELVIS WITH CONTRAST  IMPRESSION: 1. Postsurgical changes of low anterior resection and loop ileostomy formation with edema/soft tissue thickening along the presacral space measuring up to 12  mm in thickness, favored to reflect postsurgical/post treatment change. Attention on follow-up exams is recommended. 2. No evidence of metastatic disease within the chest, abdomen, or pelvis. 3. Stable small right middle lobe and right lower lobe pulmonary nodules, likely benign. 4. Stable 1.4 cm benign hepatic hemangioma.   10/16/2021 Imaging   EXAM: CT ABDOMEN AND PELVIS WITH CONTRAST  IMPRESSION: 1. Postsurgical change of low anterior resection with interval loop ileostomy takedown and similar, likely post treatment/surgical related, presacral soft tissue thickening. No evidence of new or progressive disease in the chest abdomen or pelvis to suggest recurrence or metastatic disease. 2. Stable tiny right middle lobe pulmonary nodules measuring up to 4 mm, favored benign. Continued attention on follow-up imaging suggested. 3. Stable benign 1.3 cm hepatic hemangioma. 4. 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      INTERVAL HISTORY:  Cesar Herrera is here for a follow up of  rectal cancer. He presents to the clinic accompanied by wife. Pt state that he feels well overall, still has mild diarrhea, no pain or other new complains.  He has good appetite and energy level, weight is stable.  All other systems were reviewed with the patient and are negative.  MEDICAL HISTORY:  Past Medical History:  Diagnosis Date   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   Family history of bladder cancer    Family history of lymphoma    H/O epididymitis 2011   PONV (postoperative nausea and vomiting)    Seasonal allergies     SURGICAL HISTORY: Past Surgical History:  Procedure Laterality Date   COLONOSCOPY  11/11/2020   HERNIA REPAIR N/A 02/22/2022   repaoired five hernias   ILEOSTOMY CLOSURE N/A 08/03/2021   Procedure: ILEOSTOMY CLOSURE;  Surgeon: Romie Levee, MD;  Location: [facility];  Service: General;  Laterality: N/A;   INGUINAL HERNIA REPAIR Bilateral 02/22/2022   Procedure:  LAPAROSCOPIC BILATERAL INGUINAL HERNIA REPAIR WITH MESH, BILATERAL FEMORAL HERNIA REPAIR, LYSIS OF ADHESIONS, AND BILATERAL TAP BLOCK;  Surgeon: Karie Soda, MD;  Location: [facility];  Service: General;  Laterality: Bilateral;   LAPAROSCOPY N/A 08/03/2021   Procedure: LAPAROSCOPY DIAGNOSTIC;  Surgeon: Romie Levee, MD;  Location: [facility];  Service: General;  Laterality: N/A;   VASECTOMY     VENTRAL HERNIA REPAIR N/A 02/22/2022   Procedure: LAPAROSCOPIC VENTRAL WALL HERNIA REPAIR WITH MESH;  Surgeon: Karie Soda, MD;  Location: [facility];  Service: General;  Laterality: N/A;   WISDOM TOOTH EXTRACTION     XI ROBOTIC ASSISTED LOWER ANTERIOR RESECTION N/A 03/10/2021   Procedure: XI ROBOTIC ASSISTED LOWER ANTERIOR RESECTION WITH LOOP ILEOSTOMY;  Surgeon: Romie Levee, MD;  Location: [facility];  Service: General;  Laterality: N/A;    I have reviewed the social history and family history with the patient and they are unchanged from previous note.  ALLERGIES:  is allergic to erythromycin and penicillins.  MEDICATIONS:  Current Outpatient Medications  Medication Sig Dispense Refill   acetaminophen (TYLENOL) 500 MG tablet Take 500-1,000 mg by mouth every 6 (six) hours as needed (for pain.).  ibuprofen (ADVIL) 200 MG tablet Take 200-400 mg by mouth every 8 (eight) hours as needed (pain).     loperamide (IMODIUM) 2 MG capsule Take 1 capsule (2 mg total) by mouth as needed for diarrhea or loose stools. Once abd bloating has improved 120 capsule 3   Multiple Vitamin (MULTIVITAMIN WITH MINERALS) TABS tablet Take 1 tablet by mouth 3 (three) times a week.     Polyethyl Glycol-Propyl Glycol (LUBRICANT EYE DROPS) 0.4-0.3 % SOLN Place 1-2 drops into both eyes 3 (three) times daily as needed (burning/irritated/dry eyes.).     rosuvastatin (CRESTOR) 5 MG tablet Take 1 tablet (5 mg total) by mouth daily. (Patient not taking: Reported on 08/13/2022) 90 tablet 3   tadalafil (CIALIS) 5 MG tablet TAKE 1-2 TABLETS  (5-10 MG TOTAL) BY MOUTH DAILY AS NEEDED FOR ERECTILE DYSFUNCTION. 20 tablet 11   No current facility-administered medications for this visit.    PHYSICAL EXAMINATION: ECOG PERFORMANCE STATUS: 0 - Asymptomatic  Vitals:   05/01/23 1418  BP: 128/73  Pulse: 88  Resp: 15  Temp: 98.3 F (36.8 C)  SpO2: 99%   Wt Readings from Last 3 Encounters:  05/01/23 174 lb 9.6 oz (79.2 kg)  12/27/22 174 lb 6.4 oz (79.1 kg)  08/13/22 176 lb 8 oz (80.1 kg)     GENERAL:alert, no distress and comfortable SKIN: skin color normal, no rashes or significant lesions EYES: normal, Conjunctiva are pink and non-injected, sclera clear  NEURO: alert & oriented x 3 with fluent speech NECK:(-) supple, thyroid normal size, non-tender, without nodularity LYMPH:(-)   no palpable lymphadenopathy in the cervical, axillary  ABDOMEN:(-) abdomen soft, (-) non-tender and(-) normal bowel sounds  LABORATORY DATA:  I have reviewed the data as listed    Latest Ref Rng & Units 04/25/2023    9:58 AM 12/27/2022    1:29 PM 08/13/2022    2:02 PM  CBC  WBC 4.0 - 10.5 K/uL 3.8  4.7  4.3   Hemoglobin 13.0 - 17.0 g/dL 16.1  09.6  04.5   Hematocrit 39.0 - 52.0 % 42.0  42.5  42.5   Platelets 150 - 400 K/uL 185  210  194         Latest Ref Rng & Units 04/25/2023    9:58 AM 12/27/2022    1:29 PM 08/13/2022    2:02 PM  CMP  Glucose 70 - 99 mg/dL 409  811  914   BUN 6 - 20 mg/dL 14  10  10    Creatinine 0.61 - 1.24 mg/dL 7.82  9.56  2.13   Sodium 135 - 145 mmol/L 138  141  139   Potassium 3.5 - 5.1 mmol/L 4.7  4.5  4.1   Chloride 98 - 111 mmol/L 104  105  104   CO2 22 - 32 mmol/L 26  29  30    Calcium 8.9 - 10.3 mg/dL 9.4  9.7  9.6   Total Protein 6.5 - 8.1 g/dL 7.4  7.2  6.8   Total Bilirubin 0.3 - 1.2 mg/dL 0.6  0.4  0.5   Alkaline Phos 38 - 126 U/L 62  78  75   AST 15 - 41 U/L 18  16  15    ALT 0 - 44 U/L 18  16  13        RADIOGRAPHIC STUDIES: I have personally reviewed the radiological images as listed and  agreed with the findings in the report. No results found.    No orders of  the defined types were placed in this encounter.  All questions were answered. The patient knows to call the clinic with any problems, questions or concerns. No barriers to learning was detected. The total time spent in the appointment was 25 minutes.     Malachy Mood, MD 05/01/2023

## 2023-05-02 ENCOUNTER — Ambulatory Visit (INDEPENDENT_AMBULATORY_CARE_PROVIDER_SITE_OTHER): Payer: 59 | Admitting: Internal Medicine

## 2023-05-02 VITALS — BP 120/74 | HR 77 | Temp 98.4°F | Ht 68.0 in | Wt 174.0 lb

## 2023-05-02 DIAGNOSIS — Z0000 Encounter for general adult medical examination without abnormal findings: Secondary | ICD-10-CM

## 2023-05-02 DIAGNOSIS — N529 Male erectile dysfunction, unspecified: Secondary | ICD-10-CM | POA: Diagnosis not present

## 2023-05-02 DIAGNOSIS — R739 Hyperglycemia, unspecified: Secondary | ICD-10-CM | POA: Diagnosis not present

## 2023-05-02 DIAGNOSIS — Z125 Encounter for screening for malignant neoplasm of prostate: Secondary | ICD-10-CM

## 2023-05-02 DIAGNOSIS — C20 Malignant neoplasm of rectum: Secondary | ICD-10-CM

## 2023-05-02 DIAGNOSIS — I2510 Atherosclerotic heart disease of native coronary artery without angina pectoris: Secondary | ICD-10-CM

## 2023-05-02 LAB — LIPID PANEL
Cholesterol: 164 mg/dL (ref 0–200)
HDL: 50.5 mg/dL (ref >39.00)
LDL Cholesterol: 88 mg/dL (ref 0–99)
NonHDL: 113.54
Total CHOL/HDL Ratio: 3
Triglycerides: 130 mg/dL (ref 0.0–149.0)
VLDL: 26 mg/dL (ref 0.0–40.0)

## 2023-05-02 LAB — PSA: PSA: 1.22 ng/mL (ref 0.10–4.00)

## 2023-05-02 LAB — HEMOGLOBIN A1C: Hgb A1c MFr Bld: 5 % (ref 4.6–6.5)

## 2023-05-02 LAB — TSH: TSH: 3.11 u[IU]/mL (ref 0.35–5.50)

## 2023-05-02 NOTE — Assessment & Plan Note (Signed)
Chronic Check a1c Low sugar / carb diet Stressed regular exercise  

## 2023-05-02 NOTE — Assessment & Plan Note (Signed)
Following with oncology No evidence of recurrence at this time

## 2023-05-02 NOTE — Assessment & Plan Note (Addendum)
Chronic Coronary artery calcium score 56.8 Has decided not to take statin Stressed regular exercise, healthy diet and risk reduction Can consider repeating coronary artery calcium score 5+ years Check lipids, tsh

## 2023-05-02 NOTE — Assessment & Plan Note (Signed)
Chronic Continue Cialis 5-10 mg daily as needed

## 2023-10-25 ENCOUNTER — Telehealth: Payer: Self-pay | Admitting: Hematology

## 2023-10-25 NOTE — Telephone Encounter (Signed)
Patient is aware of rescheduled appointment times/dates 

## 2023-10-30 ENCOUNTER — Ambulatory Visit: Payer: 59 | Admitting: Hematology

## 2023-10-30 ENCOUNTER — Other Ambulatory Visit: Payer: 59

## 2023-11-04 NOTE — Assessment & Plan Note (Signed)
 cT3N0/N1, stage II or III, ypT1N0 -presented with intermittent loose stool and rectal bleeding. Colonoscopy on 11/11/20 showed upper/mid rectal adenocarcinoma. -Staging work-up negative distant metastatic disease -Local staging pelvic MRI shows T3N1 disease, however the node is a single 5 mm mesorectal lymph node and could be reactive, could be N0. -s/p neoadjuvant concurrent chemo/RT with Xeloda 12/05/20 - 01/11/21.  -s/p resection and loop ileostomy on 03/10/21 under Dr. Maisie Fus. Pathology showed 2 mm residual tumor, all negative lymph nodes (0/13).  -He completed 3 months adjuvant Xeloda on 07/09/21. He tolerated well with mild fatigue. -s/p ileostomy reversal on 08/03/21, benign pathology.  -surveillance colonoscopy on 03/20/22 with Dr. Christella Hartigan showed only diverticulosis. Recall in 2026. -Surveillance CT scan from August 2023 was negative for recurrence. -Continue cancer surveillancE

## 2023-11-05 ENCOUNTER — Encounter: Payer: Self-pay | Admitting: Hematology

## 2023-11-05 ENCOUNTER — Inpatient Hospital Stay: Payer: Self-pay | Attending: Hematology

## 2023-11-05 ENCOUNTER — Inpatient Hospital Stay (HOSPITAL_BASED_OUTPATIENT_CLINIC_OR_DEPARTMENT_OTHER): Payer: Self-pay | Admitting: Hematology

## 2023-11-05 VITALS — BP 124/68 | HR 85 | Temp 98.0°F | Resp 17 | Wt 177.7 lb

## 2023-11-05 DIAGNOSIS — Z923 Personal history of irradiation: Secondary | ICD-10-CM | POA: Diagnosis not present

## 2023-11-05 DIAGNOSIS — C20 Malignant neoplasm of rectum: Secondary | ICD-10-CM | POA: Diagnosis not present

## 2023-11-05 DIAGNOSIS — Z9221 Personal history of antineoplastic chemotherapy: Secondary | ICD-10-CM | POA: Diagnosis not present

## 2023-11-05 DIAGNOSIS — Z85048 Personal history of other malignant neoplasm of rectum, rectosigmoid junction, and anus: Secondary | ICD-10-CM | POA: Insufficient documentation

## 2023-11-05 LAB — CBC WITH DIFFERENTIAL (CANCER CENTER ONLY)
Abs Immature Granulocytes: 0.01 10*3/uL (ref 0.00–0.07)
Basophils Absolute: 0 10*3/uL (ref 0.0–0.1)
Basophils Relative: 1 %
Eosinophils Absolute: 0.1 10*3/uL (ref 0.0–0.5)
Eosinophils Relative: 3 %
HCT: 43 % (ref 39.0–52.0)
Hemoglobin: 14.6 g/dL (ref 13.0–17.0)
Immature Granulocytes: 0 %
Lymphocytes Relative: 11 %
Lymphs Abs: 0.5 10*3/uL — ABNORMAL LOW (ref 0.7–4.0)
MCH: 30 pg (ref 26.0–34.0)
MCHC: 34 g/dL (ref 30.0–36.0)
MCV: 88.3 fL (ref 80.0–100.0)
Monocytes Absolute: 0.4 10*3/uL (ref 0.1–1.0)
Monocytes Relative: 8 %
Neutro Abs: 3.7 10*3/uL (ref 1.7–7.7)
Neutrophils Relative %: 77 %
Platelet Count: 226 10*3/uL (ref 150–400)
RBC: 4.87 MIL/uL (ref 4.22–5.81)
RDW: 12 % (ref 11.5–15.5)
WBC Count: 4.8 10*3/uL (ref 4.0–10.5)
nRBC: 0 % (ref 0.0–0.2)

## 2023-11-05 LAB — CMP (CANCER CENTER ONLY)
ALT: 15 U/L (ref 0–44)
AST: 15 U/L (ref 15–41)
Albumin: 4.3 g/dL (ref 3.5–5.0)
Alkaline Phosphatase: 69 U/L (ref 38–126)
Anion gap: 5 (ref 5–15)
BUN: 14 mg/dL (ref 6–20)
CO2: 31 mmol/L (ref 22–32)
Calcium: 9.6 mg/dL (ref 8.9–10.3)
Chloride: 105 mmol/L (ref 98–111)
Creatinine: 1.28 mg/dL — ABNORMAL HIGH (ref 0.61–1.24)
GFR, Estimated: >60 mL/min (ref >60)
Glucose, Bld: 100 mg/dL — ABNORMAL HIGH (ref 70–99)
Potassium: 4.1 mmol/L (ref 3.5–5.1)
Sodium: 141 mmol/L (ref 135–145)
Total Bilirubin: 0.3 mg/dL (ref 0.0–1.2)
Total Protein: 6.8 g/dL (ref 6.5–8.1)

## 2023-11-05 LAB — IRON AND IRON BINDING CAPACITY (CC-WL,HP ONLY)
Iron: 91 ug/dL (ref 45–182)
Saturation Ratios: 24 % (ref 17.9–39.5)
TIBC: 374 ug/dL (ref 250–450)
UIBC: 283 ug/dL (ref 117–376)

## 2023-11-05 LAB — FERRITIN: Ferritin: 75 ng/mL (ref 24–336)

## 2023-11-05 NOTE — Progress Notes (Unsigned)
 [facility]   [phone] Fax:([phone]) [phone]   Clinic Follow up Note   Patient Care Team: Pincus Sanes, MD as PCP - General (Internal Medicine) Pollyann Samples, NP as Nurse Practitioner (Nurse Practitioner) Malachy Mood, MD as Consulting Physician (Oncology) Karie Soda, MD as Consulting Physician (General Surgery)  Date of Service:  11/05/2023  CHIEF COMPLAINT: f/u of rectal cancer  CURRENT THERAPY:  Cancer surveillance  Oncology History   Rectal adenocarcinoma (HCC) cT3N0/N1, stage II or III, ypT1N0 -presented with intermittent loose stool and rectal bleeding. Colonoscopy on 11/11/20 showed upper/mid rectal adenocarcinoma. -Staging work-up negative distant metastatic disease -Local staging pelvic MRI shows T3N1 disease, however the node is a single 5 mm mesorectal lymph node and could be reactive, could be N0. -s/p neoadjuvant concurrent chemo/RT with Xeloda 12/05/20 - 01/11/21.  -s/p resection and loop ileostomy on 03/10/21 under Dr. Maisie Fus. Pathology showed 2 mm residual tumor, all negative lymph nodes (0/13).  -He completed 3 months adjuvant Xeloda on 07/09/21. He tolerated well with mild fatigue. -s/p ileostomy reversal on 08/03/21, benign pathology.  -surveillance colonoscopy on 03/20/22 with Dr. Christella Hartigan showed only diverticulosis. Recall in 2026. -Surveillance CT scan from August 2023 was negative for recurrence. -Continue cancer surveillancE   Assessment and Plan    Rectal Cancer Surveillance 53 year old male, three years post-diagnosis of rectal cancer (2022), treated with surgery, chemotherapy, and radiation. Last colonoscopy (July 2023) showed no polyps or cancer, mild diverticulosis in the descending colon. Reports diarrhea with occasional constipation, managed with dietary fiber and Imodium. No abdominal pain, nausea, or blood in stool. Physical exam unremarkable, surgical site well-healed. Discussed reduced recurrence risk after three years. -  Order CT scan in August 2025 - Schedule follow-up visit in August 2025 - Schedule lab work one week before August visit - Repeat colonoscopy in 2026  Diverticulosis Mild diverticulosis in the descending colon noted during last colonoscopy. Asymptomatic with no complications. - Continue high-fiber diet - Ensure adequate hydration  General Health Maintenance Discussed importance of colon cancer screening and early detection. Patient willing to participate in local TV program to raise awareness about colon cancer screening. - Provide detailed information about TV program for consideration.      Plan -Labs reviewed, exam unremarkable, he is clinically doing well, no concern for cancer recurrence -Follow-up in 5 months with lab and CT scan one week before    SUMMARY OF ONCOLOGIC HISTORY: Oncology History Overview Note   Cancer Staging  Rectal adenocarcinoma [facility]) Staging form: Colon and Rectum, AJCC 8th Edition - Clinical stage from 11/23/2020: Stage IIIB (cT3, cN1, cM0) - Signed by Pollyann Samples, NP on 11/23/2020 Stage prefix: Initial diagnosis - Pathologic stage from 03/10/2021: Stage I (ypT1, pN0, cM0) - Signed by Malachy Mood, MD on 05/31/2021 Stage prefix: Post-therapy Response to neoadjuvant therapy: Partial response Total positive nodes: 0 Histologic grading system: 4 grade system Histologic grade (G): G2 Residual tumor (R): R0 - None    Rectal adenocarcinoma (HCC)  11/11/2020 Procedure   Colonoscopy by Dr. Christella Hartigan Impression - Three 2 to 5 mm polyps in the descending colon, in the transverse colon and in the cecum, removed with a cold snare. Complete resection. 2/3 polyps retrieved, sent to pathology. - Rectal tumor that appears malignant, distal edge 7cm from the anal verge. This was biopsied and then labeled with Spot submucosal tattoo. - The examination was otherwise normal on direct and retroflexion views.   11/11/2020 Initial Biopsy   Diagnosis 1. Descending Colon  Polyp - TUBULAR ADENOMA (3 OF 3 FRAGMENTS) - NO HIGH-GRADE DYSPLASIA OR MALIGNANCY IDENTIFIED 2. Rectum, biopsy - ADENOCARCINOMA, AT LEAST INTRAMUCOSAL   11/11/2020 Tumor Marker   CEA: 1.4 CA 19-9: 10   11/15/2020 Imaging   Pelvic MRI for local staging IMPRESSION: Signs of potential T3 B disease, tortuosity of the rectum at the level of the tumor makes assessment difficult, distorted by the polypoid eccentric tumor in the high rectum as described.   N1 disease.   No extramural venous invasion. Tumor extends just to the level of the APR in this center below this level.   11/17/2020 Imaging   CT CAP IMPRESSION: 1. Known rectal mass with a 5 mm in short axis left mesorectal lymph node better shown on the prior exam and only faintly apparent on the CT. 2. 1.4 by 1.2 by 0.9 cm hypodense lesion in the right hepatic lobe on image 50 of series 2, poorly seen on the delayed images. This lesion is indeterminate by CT and could be benign or malignant. Imaging possibilities for with further workup might include hepatic protocol MRI with and without contrast or PET-CT. 3. Lucent lesion of the left posterior L2 vertebral body potentially extending slightly into the pedicle, measuring 1.9 by 1.4 cm on image 116 of series 5. This previously measured 1.6 by 1.3 cm on 03/28/2010, accordingly making it unlikely to be a metastatic lesion. 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 4. Small right middle lobe and right lower lobe pulmonary nodules are likely benign but merit surveillance in this context.   11/18/2020 Initial Diagnosis   Rectal adenocarcinoma (HCC)   11/23/2020 Cancer Staging   Staging form: Colon and Rectum, AJCC 8th Edition - Clinical stage from 11/23/2020: Stage IIIB (cT3, cN1, cM0) - Signed by Pollyann Samples, NP on 11/23/2020 Stage prefix: Initial diagnosis   12/02/2020 Imaging   MRI Liver  IMPRESSION: Tiny benign hemangioma  in the right hepatic lobe, which corresponds to the lesion seen on recent CT. No evidence of metastatic disease within the liver or abdomen.   12/05/2020 - 01/11/2021 Radiation Therapy   Concurrent chemoradiation with Dr Mitzi Hansen    12/05/2020 - 01/11/2021 Chemotherapy   Concurrent chemoradiation with Xeloda 2000mg  in the AM and 1500mg  in the PM M-F on days of Radiation   12/23/2020 Genetic Testing   Negative genetic testing:  No pathogenic variants detected on the [employer] CancerNext-Expanded + RNAinsight panel. 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 The report date is 12/23/2020.  The CancerNext-Expanded + RNAinsight gene panel offered by [employer] and includes sequencing and rearrangement analysis for the following 77 genes: AIP, ALK, APC, ATM, AXIN2, BAP1, BARD1, BLM, BMPR1A, BRCA1, BRCA2, BRIP1, CDC73, CDH1, CDK4, CDKN1B, CDKN2A, CHEK2, CTNNA1, DICER1, FANCC, FH, FLCN, GALNT12, KIF1B, LZTR1, MAX, MEN1, MET, MLH1, MSH2, MSH3, MSH6, MUTYH, NBN, NF1, NF2, NTHL1, PALB2, PHOX2B, PMS2, POT1, PRKAR1A, PTCH1, PTEN, RAD51C, RAD51D, RB1, RECQL, RET, SDHA, SDHAF2, SDHB, SDHC, SDHD, SMAD4, SMARCA4, SMARCB1, SMARCE1, STK11, SUFU, TMEM127, TP53, TSC1, TSC2, VHL and XRCC2 (sequencing and deletion/duplication); EGFR, EGLN1, HOXB13, KIT, MITF, PDGFRA, POLD1 and POLE (sequencing only); EPCAM and GREM1 (deletion/duplication only). RNA data is routinely analyzed for use in variant interpretation for all genes.    03/10/2021 Surgery   XI ROBOTIC ASSISTED LOWER ANTERIOR RESECTION WITH LOOP ILEOSTOMY, Dr. Maisie Fus  FINAL MICROSCOPIC DIAGNOSIS:   A. COLON, RECTOSIGMOID, RESECTION:  - Invasive  adenocarcinoma, moderately differentiated, spanning 2 mm.  - Tumor invades the submucosa.  - Resection margins are negative.  - No carcinoma in thirteen of thirteen lymph nodes (0/13).  - See oncology table.   B. FINAL DISTAL MARGIN:  - Benign colonic mucosa.  - No  dysplasia or malignancy.    03/10/2021 Cancer Staging   Staging form: Colon and Rectum, AJCC 8th Edition - Pathologic stage from 03/10/2021: Stage I (ypT1, pN0, cM0) - Signed by Malachy Mood, MD on 05/31/2021 Stage prefix: Post-therapy Response to neoadjuvant therapy: Partial response Total positive nodes: 0 Histologic grading system: 4 grade system Histologic grade (G): G2 Residual tumor (R): R0 - None   07/18/2021 Imaging   EXAM: CT CHEST, ABDOMEN, AND PELVIS WITH CONTRAST  IMPRESSION: 1. Postsurgical changes of low anterior resection and loop ileostomy formation with edema/soft tissue thickening along the presacral space measuring up to 12 mm in thickness, favored to reflect postsurgical/post treatment change. Attention on follow-up exams is recommended. 2. No evidence of metastatic disease within the chest, abdomen, or pelvis. 3. Stable small right middle lobe and right lower lobe pulmonary nodules, likely benign. 4. Stable 1.4 cm benign hepatic hemangioma.   10/16/2021 Imaging   EXAM: CT ABDOMEN AND PELVIS WITH CONTRAST  IMPRESSION: 1. Postsurgical change of low anterior resection with interval loop ileostomy takedown and similar, likely post treatment/surgical related, presacral soft tissue thickening. No evidence of new or progressive disease in the chest abdomen or pelvis to suggest recurrence or metastatic disease. 2. Stable tiny right middle lobe pulmonary nodules measuring up to 4 mm, favored benign. Continued attention on follow-up imaging suggested. 3. Stable benign 1.3 cm hepatic hemangioma. 4. 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      Discussed the use of AI scribe software for clinical note transcription with the patient, who gave verbal consent to proceed.  History of Present Illness   A 53 year old male with a history of rectal cancer, currently on surveillance, presents for a follow-up visit. The patient reports ongoing issues with bowel  movements, primarily characterized by diarrhea, which he manages with Imodium when it becomes severe. He also experiences occasional constipation. The patient maintains a high fiber diet and hydrates adequately. He denies any abdominal pain or hematochezia. His last colonoscopy was in July 2023, which showed diverticulosis and no polyps or cancer. The patient is due for his next colonoscopy in 2026.         All other systems were reviewed with the patient and are negative.  MEDICAL HISTORY:  Past Medical History:  Diagnosis Date   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   Family history of bladder cancer    Family history of lymphoma    H/O epididymitis 2011   PONV (postoperative nausea and vomiting)    Seasonal allergies     SURGICAL HISTORY: Past Surgical History:  Procedure Laterality Date   COLONOSCOPY  11/11/2020   HERNIA REPAIR N/A 02/22/2022   repaoired five hernias   ILEOSTOMY CLOSURE N/A 08/03/2021   Procedure: ILEOSTOMY CLOSURE;  Surgeon: Romie Levee, MD;  Location: [facility];  Service: General;  Laterality: N/A;   INGUINAL HERNIA REPAIR Bilateral 02/22/2022   Procedure: LAPAROSCOPIC BILATERAL INGUINAL HERNIA REPAIR WITH MESH, BILATERAL FEMORAL HERNIA REPAIR, LYSIS OF ADHESIONS, AND BILATERAL TAP BLOCK;  Surgeon: Karie Soda, MD;  Location: [facility];  Service: General;  Laterality: Bilateral;   LAPAROSCOPY N/A 08/03/2021   Procedure: LAPAROSCOPY DIAGNOSTIC;  Surgeon: Romie Levee, MD;  Location: [facility];  Service: General;  Laterality: N/A;   VASECTOMY     VENTRAL HERNIA REPAIR N/A 02/22/2022   Procedure: LAPAROSCOPIC VENTRAL WALL HERNIA REPAIR WITH MESH;  Surgeon: Karie Soda, MD;  Location: [facility];  Service: General;  Laterality: N/A;   WISDOM TOOTH EXTRACTION     XI ROBOTIC ASSISTED LOWER ANTERIOR RESECTION N/A 03/10/2021   Procedure: XI ROBOTIC ASSISTED LOWER ANTERIOR RESECTION WITH LOOP ILEOSTOMY;  Surgeon: Romie Levee, MD;  Location: [facility];  Service: General;  Laterality:  N/A;    I have reviewed the social history and family history with the patient and they are unchanged from previous note.  ALLERGIES:  is allergic to erythromycin and penicillins.  MEDICATIONS:  Current Outpatient Medications  Medication Sig Dispense Refill   acetaminophen (TYLENOL) 500 MG tablet Take 500-1,000 mg by mouth every 6 (six) hours as needed (for pain.).     loperamide (IMODIUM) 2 MG capsule Take 1 capsule (2 mg total) by mouth as needed for diarrhea or loose stools. Once abd bloating has improved 120 capsule 3   Multiple Vitamin (MULTIVITAMIN WITH MINERALS) TABS tablet Take 1 tablet by mouth 3 (three) times a week.     Polyethyl Glycol-Propyl Glycol (LUBRICANT EYE DROPS) 0.4-0.3 % SOLN Place 1-2 drops into both eyes 3 (three) times daily as needed (burning/irritated/dry eyes.).     tadalafil (CIALIS) 5 MG tablet TAKE 1-2 TABLETS (5-10 MG TOTAL) BY MOUTH DAILY AS NEEDED FOR ERECTILE DYSFUNCTION. 20 tablet 11   No current facility-administered medications for this visit.    PHYSICAL EXAMINATION: ECOG PERFORMANCE STATUS: 0 - Asymptomatic  Vitals:   11/05/23 1441  BP: 124/68  Pulse: 85  Resp: 17  Temp: 98 F (36.7 C)  SpO2: 99%   Wt Readings from Last 3 Encounters:  11/05/23 177 lb 11.2 oz (80.6 kg)  05/02/23 174 lb (78.9 kg)  05/01/23 174 lb 9.6 oz (79.2 kg)     GENERAL:alert, no distress and comfortable SKIN: skin color, texture, turgor are normal, no rashes or significant lesions EYES: normal, Conjunctiva are pink and non-injected, sclera clear NECK: supple, thyroid normal size, non-tender, without nodularity LYMPH:  no palpable lymphadenopathy in the cervical, axillary  LUNGS: clear to auscultation and percussion with normal breathing effort HEART: regular rate & rhythm and no murmurs and no lower extremity edema ABDOMEN:abdomen soft, non-tender and normal bowel sounds Musculoskeletal:no cyanosis of digits and no clubbing  NEURO: alert & oriented x 3 with  fluent speech, no focal motor/sensory deficits RECTAL: Anus with small hemorrhoids. Rectal exam normal.      LABORATORY DATA:  I have reviewed the data as listed    Latest Ref Rng & Units 11/05/2023    2:10 PM 04/25/2023    9:58 AM 12/27/2022    1:29 PM  CBC  WBC 4.0 - 10.5 K/uL 4.8  3.8  4.7   Hemoglobin 13.0 - 17.0 g/dL 78.2  95.6  21.3   Hematocrit 39.0 - 52.0 % 43.0  42.0  42.5   Platelets 150 - 400 K/uL 226  185  210         Latest Ref Rng & Units 11/05/2023    2:10 PM 04/25/2023    9:58 AM 12/27/2022    1:29 PM  CMP  Glucose 70 - 99 mg/dL 086  578  469   BUN 6 - 20 mg/dL 14  14  10    Creatinine 0.61 - 1.24 mg/dL 6.29  5.28  4.13   Sodium 135 - 145 mmol/L 141  138  141   Potassium 3.5 - 5.1 mmol/L 4.1  4.7  4.5   Chloride 98 - 111 mmol/L 105  104  105   CO2 22 - 32 mmol/L 31  26  29    Calcium 8.9 - 10.3 mg/dL 9.6  9.4  9.7   Total Protein 6.5 - 8.1 g/dL 6.8  7.4  7.2   Total Bilirubin 0.0 - 1.2 mg/dL 0.3  0.6  0.4   Alkaline Phos 38 - 126 U/L 69  62  78   AST 15 - 41 U/L 15  18  16    ALT 0 - 44 U/L 15  18  16        RADIOGRAPHIC STUDIES: I have personally reviewed the radiological images as listed and agreed with the findings in the report. No results found.    Orders Placed This Encounter  Procedures   CT CHEST ABDOMEN PELVIS W CONTRAST    Standing Status:   Future    Expected Date:   04/01/2024    Expiration Date:   11/06/2024    If indicated for the ordered procedure, I authorize the administration of contrast media per Radiology protocol:   Yes    Does the patient have a contrast media/X-ray dye allergy?:   No    Preferred imaging location?:   [facility]    If indicated for the ordered procedure, I authorize the administration of oral contrast media per Radiology protocol:   Yes   All questions were answered. The patient knows to call the clinic with any problems, questions or concerns. No barriers to learning was detected. The total time spent in the  appointment was 25 minutes.     Malachy Mood, MD 11/05/2023

## 2023-11-06 ENCOUNTER — Encounter: Payer: Self-pay | Admitting: Nurse Practitioner

## 2023-11-07 ENCOUNTER — Other Ambulatory Visit: Payer: Self-pay | Admitting: Hematology

## 2023-11-17 ENCOUNTER — Other Ambulatory Visit: Payer: Self-pay | Admitting: Internal Medicine

## 2023-12-24 ENCOUNTER — Ambulatory Visit (INDEPENDENT_AMBULATORY_CARE_PROVIDER_SITE_OTHER): Admitting: Internal Medicine

## 2023-12-24 ENCOUNTER — Encounter: Payer: Self-pay | Admitting: Internal Medicine

## 2023-12-24 VITALS — BP 120/80 | HR 70 | Temp 98.2°F | Ht 68.0 in | Wt 176.0 lb

## 2023-12-24 DIAGNOSIS — H9313 Tinnitus, bilateral: Secondary | ICD-10-CM

## 2023-12-24 DIAGNOSIS — H9319 Tinnitus, unspecified ear: Secondary | ICD-10-CM | POA: Insufficient documentation

## 2023-12-24 NOTE — Progress Notes (Signed)
 Subjective:    Patient ID: Cesar Herrera, male    DOB: Apr 28, 1971, 53 y.o.   MRN: [id number]      HPI Cesar Herrera is here for  Chief Complaint  Patient presents with   Tinnitus    Ringing in both ears (has gotten worse over time) Left ear more than right; Hears it also when he's sleeping     Started several weeks ago.  It is worse when he is sleeping.  Feels like both ears, maybe more left sided.  It is a constant tone when he wakes up from sleeping - has a hard time going back to sleep.  He does notice it a little during the day.    Has allergies and some congestion related to that.    Hearing is pretty good.  No recent infections.   No TMJ issues.  No obvious loud noise exposures except for concerts over the years.    At times the tinnitus sounds like it is consistent with his heartbeat and he was concerned about an aneurysm.  Struggles with stress- work related and fatigue.  Tries to exercise.  He walks.     Medications and allergies reviewed with patient and updated if appropriate.  Current Outpatient Medications on File Prior to Visit  Medication Sig Dispense Refill   acetaminophen  (TYLENOL ) 500 MG tablet Take 500-1,000 mg by mouth every 6 (six) hours as needed (for pain.).     loperamide  (IMODIUM ) 2 MG capsule Take 1 capsule (2 mg total) by mouth as needed for diarrhea or loose stools. Once abd bloating has improved 120 capsule 3   Multiple Vitamin (MULTIVITAMIN WITH MINERALS) TABS tablet Take 1 tablet by mouth 3 (three) times a week.     Polyethyl Glycol-Propyl Glycol (LUBRICANT EYE DROPS) 0.4-0.3 % SOLN Place 1-2 drops into both eyes 3 (three) times daily as needed (burning/irritated/dry eyes.).     tadalafil  (CIALIS ) 5 MG tablet TAKE 1-2 TABLETS (5-10 MG TOTAL) BY MOUTH DAILY AS NEEDED FOR ERECTILE DYSFUNCTION. 20 tablet 11   No current facility-administered medications on file prior to visit.    Review of Systems  Constitutional:  Negative for fever.  HENT:   Positive for tinnitus. Negative for congestion, ear discharge, ear pain and hearing loss.   Neurological:  Negative for dizziness, light-headedness and headaches.       Objective:   Vitals:   12/24/23 0829  BP: 120/80  Pulse: 70  Temp: 98.2 F (36.8 C)  SpO2: 95%   BP Readings from Last 3 Encounters:  12/24/23 120/80  11/05/23 124/68  05/02/23 120/74   Wt Readings from Last 3 Encounters:  12/24/23 176 lb (79.8 kg)  11/05/23 177 lb 11.2 oz (80.6 kg)  05/02/23 174 lb (78.9 kg)   Body mass index is 26.76 kg/m.    Physical Exam Constitutional:      General: He is not in acute distress.    Appearance: Normal appearance. He is not ill-appearing.  HENT:     Head: Normocephalic and atraumatic.     Right Ear: Tympanic membrane, ear canal and external ear normal. There is no impacted cerumen.     Left Ear: Tympanic membrane, ear canal and external ear normal. There is no impacted cerumen.  Eyes:     Conjunctiva/sclera: Conjunctivae normal.  Musculoskeletal:     Cervical back: Neck supple. No tenderness.  Lymphadenopathy:     Cervical: No cervical adenopathy.  Skin:    General: Skin is warm and dry.  Neurological:     Mental Status: He is alert.            Assessment & Plan:    See Problem List for Assessment and Plan of chronic medical problems.

## 2023-12-24 NOTE — Assessment & Plan Note (Signed)
 New Started several weeks ago Tends to be worse on the left side, but feels bilateral Notices it most at night when he wakes up in the middle of the night and it is quiet-sometimes makes it difficult to get back to sleep Does not notice it much during the day Denies hearing loss Ear exam normal No recent infections, TMJ issues, loud noise exposures, new medications ?  Related to mild hearing loss he is not aware of-discussed this is one of the most common causes Deferred audiology evaluation at this time-discussed that if this does not improve he can let me know and I will refer him-should rule out this before considering imaging or further testing.

## 2023-12-24 NOTE — Patient Instructions (Addendum)
 If your tinnitus does not improve lets have you see an audiologist.      Tinnitus Tinnitus is when you hear a sound that there's no actual source for. It may sound like ringing in your ears or something else. The sound may be loud, soft, or somewhere in between. It can last for a few seconds or be constant for days. It can come and go. Almost everyone has tinnitus at some point. It's not the same as hearing loss. But you may need to see a health care provider if: It lasts for a long time. It comes back often. You have trouble sleeping and focusing. What are the causes? The cause of tinnitus is often unknown. In some cases, you may get it if: You're around loud noises, such as from machines or music. An object gets stuck in your ear. Earwax builds up in Landscape architect. You drink a lot of alcohol  or caffeine. You take certain medicines. You start to lose your hearing. You may also get it from some medical conditions. These may include: Ear or sinus infections. Heart diseases. High blood pressure. Allergies. Mnire's disease. Problems with your thyroid . A tumor. This is a growth of cells that isn't normal. A weak, bulging blood vessel called an aneurysm near your ear. What increases the risk? You may be more likely to get tinnitus if: You're around loud noises a lot. You're older. You drink alcohol . You smoke. What are the signs or symptoms? The main symptom is hearing a sound that there's no source for. It may sound like ringing. It may also sound like: Buzzing. Sizzling. Blowing air. Hissing. Whistling. Other sounds may include: Roaring. Running water. A musical note. Tapping. Humming. You may have symptoms in one ear or both ears. How is this diagnosed? Tinnitus is diagnosed based on your symptoms, your medical history, and an exam. Your provider may do a full hearing test if your tinnitus: Is in just one ear. Makes it hard for you to hear. Lasts 6 months or  longer. You may also need to see an expert in hearing disorders called an audiologist. They may ask you about your symptoms and how tinnitus affects your daily life. You may have other tests done. These may include: A CT scan. An MRI. An angiogram. This shows how blood flows through your blood vessels. How is this treated? Treatment may include: Therapy to help you manage the stress of living with tinnitus. Finding ways to mask or cover the sound of tinnitus. These include: Sound or white noise machines. Devices that fit in your ear and play sounds or music. A loud humidifier. Acoustic neural stimulation. This is when you use headphones to listen to music that has a special signal in it. Over time, this signal may change some of the pathways in your brain. This can make you less sensitive to tinnitus. This treatment is used for very severe cases. Using hearing aids or cochlear implants if your tinnitus is from hearing loss. If your tinnitus is caused by a medical condition, treating the condition may make it go away.  Follow these instructions at home: Managing symptoms     Try to avoid being in loud places or around loud noises. Wear earplugs or headphones when you're around loud noises. Find ways to reduce stress. These may include meditation, yoga, or deep breathing. Sleep with your head slightly raised. General instructions Take over-the-counter and prescription medicines only as told by your provider. Track the things that cause  symptoms (triggers). Try to avoid these things. To stop your tinnitus from getting worse: Do not drink alcohol . Do not have caffeine. Do not use any products that contain nicotine or tobacco. These products include cigarettes, chewing tobacco, and vaping devices, such as e-cigarettes. If you need help quitting, ask your provider. Avoid using too much salt. Get enough sleep each night. Where to find more information American Tinnitus Association:  https://www.johnson-hamilton.org/ Contact a health care provider if: Your symptoms last for 3 weeks or longer without stopping. You have sudden hearing loss. Your symptoms get worse or don't get better with home care. You can't manage the stress of living with tinnitus. Get help right away if: You get tinnitus after a head injury. You have tinnitus and: Dizziness. Nausea and vomiting. Loss of balance. A sudden, severe headache. Changes to your eyesight. Weakness in your face, arms, or legs. These symptoms may be an emergency. Get help right away. Call 911. Do not wait to see if the symptoms will go away. Do not drive yourself to the hospital. This information is not intended to replace advice given to you by your health care provider. Make sure you discuss any questions you have with your health care provider. Document Revised: 11/26/2022 Document Reviewed: 11/26/2022 Elsevier Patient Education  2024 [employer].

## 2024-01-29 ENCOUNTER — Encounter: Payer: Self-pay | Admitting: Internal Medicine

## 2024-01-29 DIAGNOSIS — H9319 Tinnitus, unspecified ear: Secondary | ICD-10-CM

## 2024-04-01 ENCOUNTER — Ambulatory Visit (HOSPITAL_COMMUNITY)
Admission: RE | Admit: 2024-04-01 | Discharge: 2024-04-01 | Disposition: A | Discharge status: Home / Self Care | Source: Ambulatory Visit | Attending: Hematology | Admitting: Hematology

## 2024-04-01 ENCOUNTER — Inpatient Hospital Stay: Attending: Hematology

## 2024-04-01 DIAGNOSIS — C20 Malignant neoplasm of rectum: Secondary | ICD-10-CM | POA: Insufficient documentation

## 2024-04-01 DIAGNOSIS — Z9221 Personal history of antineoplastic chemotherapy: Secondary | ICD-10-CM | POA: Insufficient documentation

## 2024-04-01 DIAGNOSIS — Z85048 Personal history of other malignant neoplasm of rectum, rectosigmoid junction, and anus: Secondary | ICD-10-CM | POA: Insufficient documentation

## 2024-04-01 DIAGNOSIS — Z923 Personal history of irradiation: Secondary | ICD-10-CM | POA: Insufficient documentation

## 2024-04-01 LAB — IRON AND IRON BINDING CAPACITY (CC-WL,HP ONLY)
Iron: 66 ug/dL (ref 45–182)
Saturation Ratios: 16 % — ABNORMAL LOW (ref 17.9–39.5)
TIBC: 406 ug/dL (ref 250–450)
UIBC: 340 ug/dL (ref 117–376)

## 2024-04-01 LAB — CBC WITH DIFFERENTIAL (CANCER CENTER ONLY)
Abs Immature Granulocytes: 0.02 K/uL (ref 0.00–0.07)
Basophils Absolute: 0 K/uL (ref 0.0–0.1)
Basophils Relative: 1 %
Eosinophils Absolute: 0.1 K/uL (ref 0.0–0.5)
Eosinophils Relative: 1 %
HCT: 43.2 % (ref 39.0–52.0)
Hemoglobin: 15 g/dL (ref 13.0–17.0)
Immature Granulocytes: 1 %
Lymphocytes Relative: 11 %
Lymphs Abs: 0.5 K/uL — ABNORMAL LOW (ref 0.7–4.0)
MCH: 30.1 pg (ref 26.0–34.0)
MCHC: 34.7 g/dL (ref 30.0–36.0)
MCV: 86.7 fL (ref 80.0–100.0)
Monocytes Absolute: 0.3 K/uL (ref 0.1–1.0)
Monocytes Relative: 8 %
Neutro Abs: 3.3 K/uL (ref 1.7–7.7)
Neutrophils Relative %: 78 %
Platelet Count: 202 K/uL (ref 150–400)
RBC: 4.98 MIL/uL (ref 4.22–5.81)
RDW: 12.5 % (ref 11.5–15.5)
WBC Count: 4.2 K/uL (ref 4.0–10.5)
nRBC: 0 % (ref 0.0–0.2)

## 2024-04-01 LAB — CMP (CANCER CENTER ONLY)
ALT: 15 U/L (ref 0–44)
AST: 16 U/L (ref 15–41)
Albumin: 4.3 g/dL (ref 3.5–5.0)
Alkaline Phosphatase: 69 U/L (ref 38–126)
Anion gap: 5 (ref 5–15)
BUN: 11 mg/dL (ref 6–20)
CO2: 30 mmol/L (ref 22–32)
Calcium: 9.3 mg/dL (ref 8.9–10.3)
Chloride: 104 mmol/L (ref 98–111)
Creatinine: 1.03 mg/dL (ref 0.61–1.24)
GFR, Estimated: >60 mL/min (ref >60)
Glucose, Bld: 102 mg/dL — ABNORMAL HIGH (ref 70–99)
Potassium: 4.6 mmol/L (ref 3.5–5.1)
Sodium: 139 mmol/L (ref 135–145)
Total Bilirubin: 0.4 mg/dL (ref 0.0–1.2)
Total Protein: 7.1 g/dL (ref 6.5–8.1)

## 2024-04-01 LAB — FERRITIN: Ferritin: 137 ng/mL (ref 24–336)

## 2024-04-01 MED ORDER — IOHEXOL 300 MG/ML  SOLN
100.0000 mL | Freq: Once | INTRAMUSCULAR | Status: AC | PRN
  Administered 2024-04-01: 100 mL via INTRAVENOUS

## 2024-04-01 MED ORDER — IOHEXOL 9 MG/ML PO SOLN: 1000.0000 mL | ORAL | Status: AC

## 2024-04-07 ENCOUNTER — Other Ambulatory Visit: Payer: Self-pay

## 2024-04-07 ENCOUNTER — Telehealth: Payer: Self-pay

## 2024-04-07 NOTE — Assessment & Plan Note (Signed)
 cT3N0/N1, stage II or III, ypT1N0 -presented with intermittent loose stool and rectal bleeding. Colonoscopy on 11/11/20 showed upper/mid rectal adenocarcinoma. -Staging work-up negative distant metastatic disease -Local staging pelvic MRI shows T3N1 disease, however the node is a single 5 mm mesorectal lymph node and could be reactive, could be N0. -s/p neoadjuvant concurrent chemo/RT with Xeloda  12/05/20 - 01/11/21.  -s/p resection and loop ileostomy on 03/10/21 under Dr. Debby. Pathology showed 2 mm residual tumor, all negative lymph nodes (0/13).  -He completed 3 months adjuvant Xeloda  on 07/09/21. He tolerated well with mild fatigue. -s/p ileostomy reversal on 08/03/21, benign pathology.  -surveillance colonoscopy on 03/20/22 with Dr. Teressa showed only diverticulosis. Recall in 2026. -Surveillance CT scan from 04/01/2024 was negative for recurrence. -Continue cancer surveillance

## 2024-04-07 NOTE — Telephone Encounter (Signed)
 Received telephone call from the patient inquiring recent lab and ct results.  Patient stated he has an appointment tomorrow and wanted to make sure his recent labs and scans were ready for discussion. Let patient know that I would contact the reading room to get a rush on his ct scan because as of right now it has not been read by the radiologist. Patient voiced understanding.

## 2024-04-08 ENCOUNTER — Inpatient Hospital Stay: Attending: Hematology | Admitting: Hematology

## 2024-04-08 VITALS — BP 130/72 | HR 82 | Temp 98.4°F | Resp 16 | Ht 68.0 in | Wt 178.4 lb

## 2024-04-08 DIAGNOSIS — K760 Fatty (change of) liver, not elsewhere classified: Secondary | ICD-10-CM | POA: Diagnosis not present

## 2024-04-08 DIAGNOSIS — C20 Malignant neoplasm of rectum: Secondary | ICD-10-CM

## 2024-04-08 DIAGNOSIS — Z9221 Personal history of antineoplastic chemotherapy: Secondary | ICD-10-CM | POA: Diagnosis not present

## 2024-04-08 DIAGNOSIS — Z923 Personal history of irradiation: Secondary | ICD-10-CM | POA: Insufficient documentation

## 2024-04-08 DIAGNOSIS — Z85048 Personal history of other malignant neoplasm of rectum, rectosigmoid junction, and anus: Secondary | ICD-10-CM | POA: Insufficient documentation

## 2024-04-08 NOTE — Progress Notes (Signed)
 [facility]   Telephone:([phone] [phone]) [phone]   Clinic Follow up Note   Patient Care Team: Geofm Glade PARAS, MD as PCP - General (Internal Medicine) Burton, Lacie K, NP as Nurse Practitioner (Nurse Practitioner) Lanny Callander, MD as Consulting Physician (Oncology) Sheldon Standing, MD as Consulting Physician (General Surgery)  Date of Service:  04/08/2024  CHIEF COMPLAINT: f/u of rectal cancer  CURRENT THERAPY:  Surveillance  Oncology History   Rectal adenocarcinoma (HCC) cT3N0/N1, stage II or III, ypT1N0 -presented with intermittent loose stool and rectal bleeding. Colonoscopy on 11/11/20 showed upper/mid rectal adenocarcinoma. -Staging work-up negative distant metastatic disease -Local staging pelvic MRI shows T3N1 disease, however the node is a single 5 mm mesorectal lymph node and could be reactive, could be N0. -s/p neoadjuvant concurrent chemo/RT with Xeloda  12/05/20 - 01/11/21.  -s/p resection and loop ileostomy on 03/10/21 under Dr. Debby. Pathology showed 2 mm residual tumor, all negative lymph nodes (0/13).  -He completed 3 months adjuvant Xeloda  on 07/09/21. He tolerated well with mild fatigue. -s/p ileostomy reversal on 08/03/21, benign pathology.  -surveillance colonoscopy on 03/20/22 with Dr. Teressa showed only diverticulosis. Recall in 2026. -Surveillance CT scan from 04/01/2024 was negative for recurrence. -Continue cancer surveillance  Assessment & Plan Rectal cancer in remission, under surveillance Rectal cancer diagnosed in March 2022, currently in remission for over three years. Recent CT scan shows no evidence of cancer recurrence in the liver, lymph nodes, or other areas. Risk of recurrence is significantly reduced after three years. 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 Symptoms are intermittent and ongoing. - Advise use of Imodium  for diarrhea management - Use psyllium husk if constipation occurs  Fatty liver (hepatic steatosis), newly identified Newly identified fatty liver on recent CT scan. Potentially related to previous chemotherapy, high cholesterol, or dietary factors. No previous history of fatty liver noted. - Discuss findings with primary care physician during upcoming September appointment - Advise dietary modifications including reducing red meat and increasing lean meats and vegetables - Advise minimizing or abstaining from alcohol  consumption  Mild right lung inflammation, incidental finding Incidental finding of mild inflammation in the right lung on recent CT scan. Not related to cancer and not clinically significant at this time.  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 No current clinical issues related to lymphopenia. - Advise receiving flu shot and other appropriate vaccinations  Plan - He is doing very well clinically, surveillance CT scan reviewed, no evidence of recurrence - Continue surveillance, lab and follow-up in 6 months.  I do not plan to repeat the CT scans.   SUMMARY OF ONCOLOGIC HISTORY: Oncology History Overview Note   Cancer Staging  Rectal adenocarcinoma [facility]) Staging form: Colon and Rectum, AJCC 8th Edition - Clinical stage from 11/23/2020: Stage IIIB (cT3, cN1, cM0) - Signed by Burton, Lacie K, NP on 11/23/2020 Stage prefix: Initial diagnosis - Pathologic stage from 03/10/2021: Stage I (ypT1, pN0, cM0) - Signed by Lanny Callander, MD on 05/31/2021 Stage prefix: Post-therapy Response to neoadjuvant therapy: Partial response Total positive nodes: 0 Histologic grading system: 4 grade system Histologic grade (G): G2 Residual tumor (R): R0 - None    Rectal adenocarcinoma (HCC)   11/11/2020 Procedure   Colonoscopy by Dr. Teressa Impression - Three 2  to 5 mm polyps in the descending colon, in the transverse colon and in the cecum, removed with a cold snare. Complete resection. 2/3 polyps retrieved, sent to pathology. - Rectal tumor that appears malignant, distal edge 7cm from the anal verge. This was biopsied and then labeled with Spot submucosal tattoo. - The examination was otherwise normal on direct and retroflexion views.   11/11/2020 Initial Biopsy   Diagnosis 1. Descending Colon Polyp - TUBULAR ADENOMA (3 OF 3 FRAGMENTS) - NO HIGH-GRADE DYSPLASIA OR MALIGNANCY IDENTIFIED 2. Rectum, biopsy - ADENOCARCINOMA, AT LEAST INTRAMUCOSAL   11/11/2020 Tumor Marker   CEA: 1.4 CA 19-9: 10   11/15/2020 Imaging   Pelvic MRI for local staging IMPRESSION: Signs of potential T3 B disease, tortuosity of the rectum at the level of the tumor makes assessment difficult, distorted by the polypoid eccentric tumor in the high rectum as described.   N1 disease.   No extramural venous invasion. Tumor extends just to the level of the APR in this center below this level.   11/17/2020 Imaging   CT CAP IMPRESSION: 1. Known rectal mass with a 5 mm in short axis left mesorectal lymph node better shown on the prior exam and only faintly apparent on the CT. 2. 1.4 by 1.2 by 0.9 cm hypodense lesion in the right hepatic lobe on image 50 of series 2, poorly seen on the delayed images. This lesion is indeterminate by CT and could be benign or malignant. Imaging possibilities for with further workup might include hepatic protocol MRI with and without contrast or PET-CT. 3. Lucent lesion of the left posterior L2 vertebral body potentially extending slightly into the pedicle, measuring 1.9 by 1.4 cm on image 116 of series 5. This previously measured 1.6 by 1.3 cm on 03/28/2010, accordingly making it unlikely to be a metastatic lesion. Given the fairly modest increase in size of the last  11 years this is most likely a hemangioma. 4. Small right middle lobe and right lower lobe pulmonary nodules are likely benign but merit surveillance in this context.   11/18/2020 Initial Diagnosis   Rectal adenocarcinoma (HCC)   11/23/2020 Cancer Staging   Staging form: Colon and Rectum, AJCC 8th Edition - Clinical stage from 11/23/2020: Stage IIIB (cT3, cN1, cM0) - Signed by Burton, Lacie K, NP on 11/23/2020 Stage prefix: Initial diagnosis   12/02/2020 Imaging   MRI Liver  IMPRESSION: Tiny benign hemangioma in the right hepatic lobe, which corresponds to the lesion seen on recent CT. No evidence of metastatic disease within the liver or abdomen.   12/05/2020 - 01/11/2021 Radiation Therapy   Concurrent chemoradiation with Dr Dewey    12/05/2020 - 01/11/2021 Chemotherapy   Concurrent chemoradiation with Xeloda  2000mg  in the AM and 1500mg  in the PM M-F on days of Radiation   12/23/2020 Genetic Testing   Negative genetic testing:  No pathogenic variants detected on the [employer] CancerNext-Expanded + RNAinsight panel. 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 The report date is 12/23/2020.  The CancerNext-Expanded + RNAinsight gene panel offered by [employer] and includes sequencing and rearrangement analysis for the following 77 genes: AIP, ALK, APC, ATM, AXIN2, BAP1, BARD1, BLM, BMPR1A, BRCA1, BRCA2, BRIP1, CDC73, CDH1, CDK4, CDKN1B, CDKN2A, CHEK2, CTNNA1, DICER1, FANCC, FH, FLCN, GALNT12, KIF1B, LZTR1, MAX, MEN1, MET, MLH1, MSH2, MSH3, MSH6, MUTYH, NBN, NF1, NF2, NTHL1, PALB2, PHOX2B, PMS2, POT1, PRKAR1A, PTCH1, PTEN, RAD51C, RAD51D, RB1, RECQL, RET, SDHA, SDHAF2, SDHB, SDHC, SDHD, SMAD4, SMARCA4,  SMARCB1, SMARCE1, STK11, SUFU, TMEM127, TP53, TSC1, TSC2, VHL and XRCC2 (sequencing and deletion/duplication); EGFR, EGLN1, HOXB13, KIT, MITF, PDGFRA, POLD1 and POLE (sequencing only); EPCAM and GREM1 (deletion/duplication only). RNA data is  routinely analyzed for use in variant interpretation for all genes.    03/10/2021 Surgery   XI ROBOTIC ASSISTED LOWER ANTERIOR RESECTION WITH LOOP ILEOSTOMY, Dr. Debby  FINAL MICROSCOPIC DIAGNOSIS:   A. COLON, RECTOSIGMOID, RESECTION:  - Invasive adenocarcinoma, moderately differentiated, spanning 2 mm.  - Tumor invades the submucosa.  - Resection margins are negative.  - No carcinoma in thirteen of thirteen lymph nodes (0/13).  - See oncology table.   B. FINAL DISTAL MARGIN:  - Benign colonic mucosa.  - No dysplasia or malignancy.    03/10/2021 Cancer Staging   Staging form: Colon and Rectum, AJCC 8th Edition - Pathologic stage from 03/10/2021: Stage I (ypT1, pN0, cM0) - Signed by Lanny Callander, MD on 05/31/2021 Stage prefix: Post-therapy Response to neoadjuvant therapy: Partial response Total positive nodes: 0 Histologic grading system: 4 grade system Histologic grade (G): G2 Residual tumor (R): R0 - None   07/18/2021 Imaging   EXAM: CT CHEST, ABDOMEN, AND PELVIS WITH CONTRAST  IMPRESSION: 1. Postsurgical changes of low anterior resection and loop ileostomy formation with edema/soft tissue thickening along the presacral space measuring up to 12 mm in thickness, favored to reflect postsurgical/post treatment change. Attention on follow-up exams is recommended. 2. No evidence of metastatic disease within the chest, abdomen, or pelvis. 3. Stable small right middle lobe and right lower lobe pulmonary nodules, likely benign. 4. Stable 1.4 cm benign hepatic hemangioma.   10/16/2021 Imaging   EXAM: CT ABDOMEN AND PELVIS WITH CONTRAST  IMPRESSION: 1. Postsurgical change of low anterior resection with interval loop ileostomy takedown and similar, likely post treatment/surgical related, presacral soft tissue thickening. No evidence of new or progressive disease in the chest abdomen or pelvis to suggest recurrence or metastatic disease. 2. Stable tiny right middle lobe pulmonary nodules  measuring up to 4 mm, favored benign. Continued attention on follow-up imaging suggested. 3. Stable benign 1.3 cm hepatic hemangioma. 4. 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      Discussed the use of AI scribe software for clinical note transcription with the patient, who gave verbal consent to proceed.  History of Present Illness Cesar Herrera is a 53 year old male with rectal cancer who presents for follow-up.  He experiences intermittent diarrhea, managed with Imodium , and uses psyllium husk for constipation. Recent lab tests, including CBC, kidney, and liver function, are normal. Iron studies show improvement. A recent CT scan indicates a new finding of fatty liver. Diagnosed with rectal cancer in March 2022, he has undergone chemotherapy and reports no rectal pain or issues post-surgery. His last colonoscopy was in July 2023, with the next scheduled in three years. He does not smoke and consumes beer.     All other systems were reviewed with the patient and are negative.  MEDICAL HISTORY:  Past Medical History:  Diagnosis Date   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   Family history of bladder cancer    Family history of lymphoma    H/O epididymitis 2011   PONV (postoperative nausea and vomiting)    Seasonal allergies     SURGICAL HISTORY: Past Surgical History:  Procedure Laterality Date   COLONOSCOPY  11/11/2020   HERNIA REPAIR N/A 02/22/2022   repaoired five hernias   ILEOSTOMY CLOSURE N/A 08/03/2021   Procedure: ILEOSTOMY CLOSURE;  Surgeon:  Debby Hila, MD;  Location: [facility];  Service: General;  Laterality: N/A;   INGUINAL HERNIA REPAIR Bilateral 02/22/2022   Procedure: LAPAROSCOPIC BILATERAL INGUINAL HERNIA REPAIR WITH MESH, BILATERAL FEMORAL HERNIA REPAIR, LYSIS OF ADHESIONS, AND BILATERAL TAP BLOCK;  Surgeon: Sheldon Standing, MD;  Location: [facility];  Service: General;  Laterality: Bilateral;   LAPAROSCOPY N/A 08/03/2021   Procedure: LAPAROSCOPY  DIAGNOSTIC;  Surgeon: Debby Hila, MD;  Location: [facility];  Service: General;  Laterality: N/A;   VASECTOMY     VENTRAL HERNIA REPAIR N/A 02/22/2022   Procedure: LAPAROSCOPIC VENTRAL WALL HERNIA REPAIR WITH MESH;  Surgeon: Sheldon Standing, MD;  Location: [facility];  Service: General;  Laterality: N/A;   WISDOM TOOTH EXTRACTION     XI ROBOTIC ASSISTED LOWER ANTERIOR RESECTION N/A 03/10/2021   Procedure: XI ROBOTIC ASSISTED LOWER ANTERIOR RESECTION WITH LOOP ILEOSTOMY;  Surgeon: Debby Hila, MD;  Location: [facility];  Service: General;  Laterality: N/A;    I have reviewed the social history and family history with the patient and they are unchanged from previous note.  ALLERGIES:  is allergic to erythromycin and penicillins.  MEDICATIONS:  Current Outpatient Medications  Medication Sig Dispense Refill   acetaminophen  (TYLENOL ) 500 MG tablet Take 500-1,000 mg by mouth every 6 (six) hours as needed (for pain.).     loperamide  (IMODIUM ) 2 MG capsule Take 1 capsule (2 mg total) by mouth as needed for diarrhea or loose stools. Once abd bloating has improved 120 capsule 3   Multiple Vitamin (MULTIVITAMIN WITH MINERALS) TABS tablet Take 1 tablet by mouth 3 (three) times a week.     Polyethyl Glycol-Propyl Glycol (LUBRICANT EYE DROPS) 0.4-0.3 % SOLN Place 1-2 drops into both eyes 3 (three) times daily as needed (burning/irritated/dry eyes.).     tadalafil  (CIALIS ) 5 MG tablet TAKE 1-2 TABLETS (5-10 MG TOTAL) BY MOUTH DAILY AS NEEDED FOR ERECTILE DYSFUNCTION. 20 tablet 11   No current facility-administered medications for this visit.    PHYSICAL EXAMINATION: ECOG PERFORMANCE STATUS: 0 - Asymptomatic  Vitals:   04/08/24 1141  BP: 130/72  Pulse: 82  Resp: 16  Temp: 98.4 F (36.9 C)  SpO2: 98%   Wt Readings from Last 3 Encounters:  04/08/24 178 lb 6.4 oz (80.9 kg)  12/24/23 176 lb (79.8 kg)  11/05/23 177 lb 11.2 oz (80.6 kg)     GENERAL:alert, no distress and comfortable SKIN: skin color,  texture, turgor are normal, no rashes or significant lesions EYES: normal, Conjunctiva are pink and non-injected, sclera clear Musculoskeletal:no cyanosis of digits and no clubbing  NEURO: alert & oriented x 3 with fluent speech, no focal motor/sensory deficits  Physical Exam    LABORATORY DATA:  I have reviewed the data as listed    Latest Ref Rng & Units 04/01/2024   11:10 AM 11/05/2023    2:10 PM 04/25/2023    9:58 AM  CBC  WBC 4.0 - 10.5 K/uL 4.2  4.8  3.8   Hemoglobin 13.0 - 17.0 g/dL 84.9  85.3  85.0   Hematocrit 39.0 - 52.0 % 43.2  43.0  42.0   Platelets 150 - 400 K/uL 202  226  185         Latest Ref Rng & Units 04/01/2024   11:10 AM 11/05/2023    2:10 PM 04/25/2023    9:58 AM  CMP  Glucose 70 - 99 mg/dL 897  899  899   BUN 6 - 20 mg/dL 11  14  14  Creatinine 0.61 - 1.24 mg/dL 8.96  8.71  9.05   Sodium 135 - 145 mmol/L 139  141  138   Potassium 3.5 - 5.1 mmol/L 4.6  4.1  4.7   Chloride 98 - 111 mmol/L 104  105  104   CO2 22 - 32 mmol/L 30  31  26    Calcium  8.9 - 10.3 mg/dL 9.3  9.6  9.4   Total Protein 6.5 - 8.1 g/dL 7.1  6.8  7.4   Total Bilirubin 0.0 - 1.2 mg/dL 0.4  0.3  0.6   Alkaline Phos 38 - 126 U/L 69  69  62   AST 15 - 41 U/L 16  15  18    ALT 0 - 44 U/L 15  15  18        RADIOGRAPHIC STUDIES: I have personally reviewed the radiological images as listed and agreed with the findings in the report. No results found.    No orders of the defined types were placed in this encounter.  All questions were answered. The patient knows to call the clinic with any problems, questions or concerns. No barriers to learning was detected. The total time spent in the appointment was 25 minutes, including review of chart and various tests results, discussions about plan of care and coordination of care plan     Onita Mattock, MD 04/08/2024

## 2024-05-04 ENCOUNTER — Encounter: Payer: Self-pay | Admitting: Internal Medicine

## 2024-05-04 NOTE — Progress Notes (Unsigned)
 Subjective:    Patient ID: Cesar Herrera, male    DOB: 04-26-1971, 53 y.o.   MRN: [id number]     HPI Cesar Herrera is here for a physical exam and his chronic medical problems.   ? Labs - not done psa -- others no concern  Rectal adenocarcinoma - in remission, under surveillance w/o evidence of recurrence   Medications and allergies reviewed with patient and updated if appropriate.  Current Outpatient Medications on File Prior to Visit  Medication Sig Dispense Refill  . acetaminophen  (TYLENOL ) 500 MG tablet Take 500-1,000 mg by mouth every 6 (six) hours as needed (for pain.).    . loperamide  (IMODIUM ) 2 MG capsule Take 1 capsule (2 mg total) by mouth as needed for diarrhea or loose stools. Once abd bloating has improved 120 capsule 3  . Multiple Vitamin (MULTIVITAMIN WITH MINERALS) TABS tablet Take 1 tablet by mouth 3 (three) times a week.    . Polyethyl Glycol-Propyl Glycol (LUBRICANT EYE DROPS) 0.4-0.3 % SOLN Place 1-2 drops into both eyes 3 (three) times daily as needed (burning/irritated/dry eyes.).    SABRA tadalafil  (CIALIS ) 5 MG tablet TAKE 1-2 TABLETS (5-10 MG TOTAL) BY MOUTH DAILY AS NEEDED FOR ERECTILE DYSFUNCTION. 20 tablet 11   No current facility-administered medications on file prior to visit.    Review of Systems     Objective:  There were no vitals filed for this visit. There were no vitals filed for this visit. There is no height or weight on file to calculate BMI.  BP Readings from Last 3 Encounters:  05/05/24 128/80  04/08/24 130/72  12/24/23 120/80    Wt Readings from Last 3 Encounters:  05/05/24 174 lb (78.9 kg)  04/08/24 178 lb 6.4 oz (80.9 kg)  12/24/23 176 lb (79.8 kg)      Physical Exam Constitutional: He appears well-developed and well-nourished. No distress.  HENT:  Head: Normocephalic and atraumatic.  Right Ear: External ear normal.  Left Ear: External ear normal.  Normal ear canals and TM b/l  Mouth/Throat: Oropharynx is clear and  moist. Eyes: Conjunctivae and EOM are normal.  Neck: Neck supple. No tracheal deviation present. No thyromegaly present.  No carotid bruit  Cardiovascular: Normal rate, regular rhythm, normal heart sounds and intact distal pulses.   No murmur heard.  No lower extremity edema. Pulmonary/Chest: Effort normal and breath sounds normal. No respiratory distress. He has no wheezes. He has no rales.  Abdominal: Soft. He exhibits no distension. There is no tenderness.  Genitourinary: deferred  Lymphadenopathy:   He has no cervical adenopathy.  Skin: Skin is warm and dry. He is not diaphoretic.  Psychiatric: He has a normal mood and affect. His behavior is normal.         Assessment & Plan:   Physical exam: Screening blood work  ordered Exercise    Weight   Substance abuse   none   Reviewed recommended immunizations.   Health Maintenance  Topic Date Due  . Hepatitis B Vaccines 19-59 Average Risk (1 of 3 - 19+ 3-dose series) Never done  . Zoster Vaccines- Shingrix (1 of 2) Never done  . COVID-19 Vaccine (4 - 2025-26 season) 05/21/2024 (Originally 05/04/2024)  . Pneumococcal Vaccine: 50+ Years (1 of 2 - PCV) 05/05/2025 (Originally 01/20/1991)  . Colonoscopy  03/30/2025  . DTaP/Tdap/Td (3 - Td or Tdap) 09/08/2029  . Influenza Vaccine  Completed  . HIV Screening  Completed  . HPV VACCINES  Aged Out  . Meningococcal  B Vaccine  Aged Out  . Hepatitis C Screening  Discontinued     See Problem List for Assessment and Plan of chronic medical problems.   This encounter was created in error - please disregard.

## 2024-05-04 NOTE — Patient Instructions (Addendum)
 Blood work was ordered.       Medications changes include :   None    A referral was ordered and someone will call you to schedule an appointment.     Return in about 1 year (around 05/05/2025) for Physical Exam.     Health Maintenance, Male Adopting a healthy lifestyle and getting preventive care are important in promoting health and wellness. Ask your health care provider about: The right schedule for you to have regular tests and exams. Things you can do on your own to prevent diseases and keep yourself healthy. What should I know about diet, weight, and exercise? Eat a healthy diet  Eat a diet that includes plenty of vegetables, fruits, low-fat dairy products, and lean protein. Do not eat a lot of foods that are high in solid fats, added sugars, or sodium. Maintain a healthy weight Body mass index (BMI) is a measurement that can be used to identify possible weight problems. It estimates body fat based on height and weight. Your health care provider can help determine your BMI and help you achieve or maintain a healthy weight. Get regular exercise Get regular exercise. This is one of the most important things you can do for your health. Most adults should: Exercise for at least 150 minutes each week. The exercise should increase your heart rate and make you sweat (moderate-intensity exercise). Do strengthening exercises at least twice a week. This is in addition to the moderate-intensity exercise. Spend less time sitting. Even light physical activity can be beneficial. Watch cholesterol and blood lipids Have your blood tested for lipids and cholesterol at 52 years of age, then have this test every 5 years. You may need to have your cholesterol levels checked more often if: Your lipid or cholesterol levels are high. You are older than 52 years of age. You are at high risk for heart disease. What should I know about cancer screening? Many types of cancers can be  detected early and may often be prevented. Depending on your health history and family history, you may need to have cancer screening at various ages. This may include screening for: Colorectal cancer. Prostate cancer. Skin cancer. Lung cancer. What should I know about heart disease, diabetes, and high blood pressure? Blood pressure and heart disease High blood pressure causes heart disease and increases the risk of stroke. This is more likely to develop in people who have high blood pressure readings or are overweight. Talk with your health care provider about your target blood pressure readings. Have your blood pressure checked: Every 3-5 years if you are 47-57 years of age. Every year if you are 86 years old or older. If you are between the ages of 70 and 60 and are a current or former smoker, ask your health care provider if you should have a one-time screening for abdominal aortic aneurysm (AAA). Diabetes Have regular diabetes screenings. This checks your fasting blood sugar level. Have the screening done: Once every three years after age 56 if you are at a normal weight and have a low risk for diabetes. More often and at a younger age if you are overweight or have a high risk for diabetes. What should I know about preventing infection? Hepatitis B If you have a higher risk for hepatitis B, you should be screened for this virus. Talk with your health care provider to find out if you are at risk for hepatitis B infection. Hepatitis C Blood testing is  recommended for: Everyone born from [birth year] through 1964. Anyone with known risk factors for hepatitis C. Sexually transmitted infections (STIs) You should be screened each year for STIs, including gonorrhea and chlamydia, if: You are sexually active and are younger than 52 years of age. You are older than 52 years of age and your health care provider tells you that you are at risk for this type of infection. Your sexual activity has changed  since you were last screened, and you are at increased risk for chlamydia or gonorrhea. Ask your health care provider if you are at risk. Ask your health care provider about whether you are at high risk for HIV. Your health care provider may recommend a prescription medicine to help prevent HIV infection. If you choose to take medicine to prevent HIV, you should first get tested for HIV. You should then be tested every 3 months for as long as you are taking the medicine. Follow these instructions at home: Alcohol  use Do not drink alcohol  if your health care provider tells you not to drink. If you drink alcohol : Limit how much you have to 0-2 drinks a day. Know how much alcohol  is in your drink. In the [place] one drink equals one 12 oz bottle of beer (355 mL), one 5 oz glass of wine (148 mL), or one 1 oz glass of hard liquor (44 mL). Lifestyle Do not use any products that contain nicotine or tobacco. These products include cigarettes, chewing tobacco, and vaping devices, such as e-cigarettes. If you need help quitting, ask your health care provider. Do not use street drugs. Do not share needles. Ask your health care provider for help if you need support or information about quitting drugs. General instructions Schedule regular health, dental, and eye exams. Stay current with your vaccines. Tell your health care provider if: You often feel depressed. You have ever been abused or do not feel safe at home. Summary Adopting a healthy lifestyle and getting preventive care are important in promoting health and wellness. Follow your health care provider's instructions about healthy diet, exercising, and getting tested or screened for diseases. Follow your health care provider's instructions on monitoring your cholesterol and blood pressure. This information is not intended to replace advice given to you by your health care provider. Make sure you discuss any questions you have with your health care  provider. Document Revised: 01/09/2021 Document Reviewed: 01/09/2021 Elsevier Patient Education  2024 [employer].

## 2024-05-05 ENCOUNTER — Ambulatory Visit: Admitting: Internal Medicine

## 2024-05-05 ENCOUNTER — Encounter: Admitting: Internal Medicine

## 2024-05-05 VITALS — BP 128/80 | HR 66 | Temp 98.6°F | Ht 68.0 in | Wt 174.0 lb

## 2024-05-05 DIAGNOSIS — N529 Male erectile dysfunction, unspecified: Secondary | ICD-10-CM

## 2024-05-05 DIAGNOSIS — Z0000 Encounter for general adult medical examination without abnormal findings: Secondary | ICD-10-CM

## 2024-05-05 DIAGNOSIS — Z23 Encounter for immunization: Secondary | ICD-10-CM

## 2024-05-05 DIAGNOSIS — R739 Hyperglycemia, unspecified: Secondary | ICD-10-CM

## 2024-05-05 DIAGNOSIS — I2510 Atherosclerotic heart disease of native coronary artery without angina pectoris: Secondary | ICD-10-CM | POA: Diagnosis not present

## 2024-05-05 DIAGNOSIS — Z125 Encounter for screening for malignant neoplasm of prostate: Secondary | ICD-10-CM

## 2024-05-05 NOTE — Assessment & Plan Note (Signed)
 Chronic Lab Results  Component Value Date   HGBA1C 5.0 05/02/2023   Check a1c Low sugar / carb diet Stressed regular exercise

## 2024-05-05 NOTE — Assessment & Plan Note (Signed)
Chronic Continue Cialis 5-10 mg daily as needed

## 2024-05-05 NOTE — Patient Instructions (Addendum)
 Flu immunization administered today.     Blood work was ordered.       Medications changes include :   None     Return in about 1 year (around 05/05/2025) for Physical Exam.    Health Maintenance, Male Adopting a healthy lifestyle and getting preventive care are important in promoting health and wellness. Ask your health care provider about: The right schedule for you to have regular tests and exams. Things you can do on your own to prevent diseases and keep yourself healthy. What should I know about diet, weight, and exercise? Eat a healthy diet  Eat a diet that includes plenty of vegetables, fruits, low-fat dairy products, and lean protein. Do not eat a lot of foods that are high in solid fats, added sugars, or sodium. Maintain a healthy weight Body mass index (BMI) is a measurement that can be used to identify possible weight problems. It estimates body fat based on height and weight. Your health care provider can help determine your BMI and help you achieve or maintain a healthy weight. Get regular exercise Get regular exercise. This is one of the most important things you can do for your health. Most adults should: Exercise for at least 150 minutes each week. The exercise should increase your heart rate and make you sweat (moderate-intensity exercise). Do strengthening exercises at least twice a week. This is in addition to the moderate-intensity exercise. Spend less time sitting. Even light physical activity can be beneficial. Watch cholesterol and blood lipids Have your blood tested for lipids and cholesterol at 52 years of age, then have this test every 5 years. You may need to have your cholesterol levels checked more often if: Your lipid or cholesterol levels are high. You are older than 52 years of age. You are at high risk for heart disease. What should I know about cancer screening? Many types of cancers can be detected early and may often be prevented.  Depending on your health history and family history, you may need to have cancer screening at various ages. This may include screening for: Colorectal cancer. Prostate cancer. Skin cancer. Lung cancer. What should I know about heart disease, diabetes, and high blood pressure? Blood pressure and heart disease High blood pressure causes heart disease and increases the risk of stroke. This is more likely to develop in people who have high blood pressure readings or are overweight. Talk with your health care provider about your target blood pressure readings. Have your blood pressure checked: Every 3-5 years if you are 31-14 years of age. Every year if you are 92 years old or older. If you are between the ages of 6 and 35 and are a current or former smoker, ask your health care provider if you should have a one-time screening for abdominal aortic aneurysm (AAA). Diabetes Have regular diabetes screenings. This checks your fasting blood sugar level. Have the screening done: Once every three years after age 67 if you are at a normal weight and have a low risk for diabetes. More often and at a younger age if you are overweight or have a high risk for diabetes. What should I know about preventing infection? Hepatitis B If you have a higher risk for hepatitis B, you should be screened for this virus. Talk with your health care provider to find out if you are at risk for hepatitis B infection. Hepatitis C Blood testing is recommended for: Everyone born from [birth year] through 1965. Anyone with known risk  factors for hepatitis C. Sexually transmitted infections (STIs) You should be screened each year for STIs, including gonorrhea and chlamydia, if: You are sexually active and are younger than 52 years of age. You are older than 52 years of age and your health care provider tells you that you are at risk for this type of infection. Your sexual activity has changed since you were last screened, and you are  at increased risk for chlamydia or gonorrhea. Ask your health care provider if you are at risk. Ask your health care provider about whether you are at high risk for HIV. Your health care provider may recommend a prescription medicine to help prevent HIV infection. If you choose to take medicine to prevent HIV, you should first get tested for HIV. You should then be tested every 3 months for as long as you are taking the medicine. Follow these instructions at home: Alcohol  use Do not drink alcohol  if your health care provider tells you not to drink. If you drink alcohol : Limit how much you have to 0-2 drinks a day. Know how much alcohol  is in your drink. In the [place] one drink equals one 12 oz bottle of beer (355 mL), one 5 oz glass of wine (148 mL), or one 1 oz glass of hard liquor (44 mL). Lifestyle Do not use any products that contain nicotine or tobacco. These products include cigarettes, chewing tobacco, and vaping devices, such as e-cigarettes. If you need help quitting, ask your health care provider. Do not use street drugs. Do not share needles. Ask your health care provider for help if you need support or information about quitting drugs. General instructions Schedule regular health, dental, and eye exams. Stay current with your vaccines. Tell your health care provider if: You often feel depressed. You have ever been abused or do not feel safe at home. Summary Adopting a healthy lifestyle and getting preventive care are important in promoting health and wellness. Follow your health care provider's instructions about healthy diet, exercising, and getting tested or screened for diseases. Follow your health care provider's instructions on monitoring your cholesterol and blood pressure. This information is not intended to replace advice given to you by your health care provider. Make sure you discuss any questions you have with your health care provider. Document Revised: 01/09/2021  Document Reviewed: 01/09/2021 Elsevier Patient Education  2024 [employer].

## 2024-05-05 NOTE — Progress Notes (Signed)
 Subjective:    Patient ID: Cesar Herrera, male    DOB: September 19, 1970, 53 y.o.   MRN: [id number]     HPI Cesar Herrera is here for a physical exam and his chronic medical problems.  Overall doing well  Rectal adenocarcinoma - in remission, under surveillance w/o evidence of recurrence   Medications and allergies reviewed with patient and updated if appropriate.  Current Outpatient Medications on File Prior to Visit  Medication Sig Dispense Refill   acetaminophen  (TYLENOL ) 500 MG tablet Take 500-1,000 mg by mouth every 6 (six) hours as needed (for pain.).     loperamide  (IMODIUM ) 2 MG capsule Take 1 capsule (2 mg total) by mouth as needed for diarrhea or loose stools. Once abd bloating has improved 120 capsule 3   Multiple Vitamin (MULTIVITAMIN WITH MINERALS) TABS tablet Take 1 tablet by mouth 3 (three) times a week.     Polyethyl Glycol-Propyl Glycol (LUBRICANT EYE DROPS) 0.4-0.3 % SOLN Place 1-2 drops into both eyes 3 (three) times daily as needed (burning/irritated/dry eyes.).     tadalafil  (CIALIS ) 5 MG tablet TAKE 1-2 TABLETS (5-10 MG TOTAL) BY MOUTH DAILY AS NEEDED FOR ERECTILE DYSFUNCTION. 20 tablet 11   No current facility-administered medications on file prior to visit.    Review of Systems  Constitutional:  Negative for fever.  HENT:  Positive for tinnitus (intermittent).   Eyes:  Negative for visual disturbance.  Respiratory:  Negative for cough, shortness of breath and wheezing.   Cardiovascular:  Negative for chest pain, palpitations and leg swelling.  Gastrointestinal:  Positive for diarrhea (chronic). Negative for abdominal pain, blood in stool and constipation.       No gerd  Genitourinary:  Negative for difficulty urinating, dysuria and hematuria.  Musculoskeletal:  Negative for arthralgias and back pain.  Skin:  Negative for rash.  Neurological:  Positive for light-headedness (a little occ with tinnitus). Negative for headaches (occ).  Psychiatric/Behavioral:   Negative for dysphoric mood and sleep disturbance. The patient is not nervous/anxious.        Objective:   Vitals:   05/05/24 1543  BP: 128/80  Pulse: 66  Temp: 98.6 F (37 C)  SpO2: 96%   Filed Weights   05/05/24 1543  Weight: 174 lb (78.9 kg)   Body mass index is 26.46 kg/m.  BP Readings from Last 3 Encounters:  05/05/24 128/80  04/08/24 130/72  12/24/23 120/80    Wt Readings from Last 3 Encounters:  05/05/24 174 lb (78.9 kg)  04/08/24 178 lb 6.4 oz (80.9 kg)  12/24/23 176 lb (79.8 kg)      Physical Exam Constitutional: He appears well-developed and well-nourished. No distress.  HENT:  Head: Normocephalic and atraumatic.  Right Ear: External ear normal.  Left Ear: External ear normal.  Normal ear canals and TM b/l  Mouth/Throat: Oropharynx is clear and moist. Eyes: Conjunctivae and EOM are normal.  Neck: Neck supple. No tracheal deviation present. No thyromegaly present.  No carotid bruit  Cardiovascular: Normal rate, regular rhythm, normal heart sounds and intact distal pulses.   No murmur heard.  No lower extremity edema. Pulmonary/Chest: Effort normal and breath sounds normal. No respiratory distress. He has no wheezes. He has no rales.  Abdominal: Soft. He exhibits no distension. There is no tenderness.  Genitourinary: deferred  Lymphadenopathy:   He has no cervical adenopathy.  Skin: Skin is warm and dry. He is not diaphoretic.  Psychiatric: He has a normal mood and affect. His behavior is  normal.         Assessment & Plan:   Physical exam: Screening blood work  ordered Exercise   walking daily - 30-45 minutes Weight  overweight Substance abuse   none   Reviewed recommended immunizations.   Health Maintenance  Topic Date Due   Pneumococcal Vaccine: 50+ Years (1 of 2 - PCV) Never done   Hepatitis B Vaccines 19-59 Average Risk (1 of 3 - 19+ 3-dose series) Never done   Zoster Vaccines- Shingrix (1 of 2) Never done   COVID-19 Vaccine (4 -  2025-26 season) 05/04/2024   Colonoscopy  03/30/2025   DTaP/Tdap/Td (3 - Td or Tdap) 09/08/2029   INFLUENZA VACCINE  Completed   HIV Screening  Completed   HPV VACCINES  Aged Out   Meningococcal B Vaccine  Aged Out   Hepatitis C Screening  Discontinued     See Problem List for Assessment and Plan of chronic medical problems.

## 2024-05-05 NOTE — Assessment & Plan Note (Addendum)
 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

## 2024-05-22 ENCOUNTER — Ambulatory Visit: Payer: Self-pay | Admitting: Internal Medicine

## 2024-05-22 ENCOUNTER — Other Ambulatory Visit (INDEPENDENT_AMBULATORY_CARE_PROVIDER_SITE_OTHER)

## 2024-05-22 DIAGNOSIS — Z125 Encounter for screening for malignant neoplasm of prostate: Secondary | ICD-10-CM

## 2024-05-22 DIAGNOSIS — I2510 Atherosclerotic heart disease of native coronary artery without angina pectoris: Secondary | ICD-10-CM

## 2024-05-22 DIAGNOSIS — R739 Hyperglycemia, unspecified: Secondary | ICD-10-CM

## 2024-05-22 LAB — COMPREHENSIVE METABOLIC PANEL WITH GFR
ALT: 18 U/L (ref 0–53)
AST: 16 U/L (ref 0–37)
Albumin: 4.4 g/dL (ref 3.5–5.2)
Alkaline Phosphatase: 67 U/L (ref 39–117)
BUN: 12 mg/dL (ref 6–23)
CO2: 28 meq/L (ref 19–32)
Calcium: 9.4 mg/dL (ref 8.4–10.5)
Chloride: 103 meq/L (ref 96–112)
Creatinine, Ser: 1.04 mg/dL (ref 0.40–1.50)
GFR: 82.66 mL/min (ref >60.00)
Glucose, Bld: 105 mg/dL — ABNORMAL HIGH (ref 70–99)
Potassium: 4.4 meq/L (ref 3.5–5.1)
Sodium: 138 meq/L (ref 135–145)
Total Bilirubin: 0.6 mg/dL (ref 0.2–1.2)
Total Protein: 6.7 g/dL (ref 6.0–8.3)

## 2024-05-22 LAB — CBC WITH DIFFERENTIAL/PLATELET
Basophils Absolute: 0 K/uL (ref 0.0–0.1)
Basophils Relative: 0.6 % (ref 0.0–3.0)
Eosinophils Absolute: 0.2 K/uL (ref 0.0–0.7)
Eosinophils Relative: 4.3 % (ref 0.0–5.0)
HCT: 43.3 % (ref 39.0–52.0)
Hemoglobin: 14.7 g/dL (ref 13.0–17.0)
Lymphocytes Relative: 29.3 % (ref 12.0–46.0)
Lymphs Abs: 1.1 K/uL (ref 0.7–4.0)
MCHC: 33.9 g/dL (ref 30.0–36.0)
MCV: 87.8 fl (ref 78.0–100.0)
Monocytes Absolute: 0.3 K/uL (ref 0.1–1.0)
Monocytes Relative: 7.6 % (ref 3.0–12.0)
Neutro Abs: 2.2 K/uL (ref 1.4–7.7)
Neutrophils Relative %: 58.2 % (ref 43.0–77.0)
Platelets: 207 K/uL (ref 150.0–400.0)
RBC: 4.94 Mil/uL (ref 4.22–5.81)
RDW: 12.5 % (ref 11.5–15.5)
WBC: 3.8 K/uL — ABNORMAL LOW (ref 4.0–10.5)

## 2024-05-22 LAB — LIPID PANEL
Cholesterol: 171 mg/dL (ref 0–200)
HDL: 48.7 mg/dL (ref >39.00)
LDL Cholesterol: 102 mg/dL — ABNORMAL HIGH (ref 0–99)
NonHDL: 122.5
Total CHOL/HDL Ratio: 4
Triglycerides: 101 mg/dL (ref 0.0–149.0)
VLDL: 20.2 mg/dL (ref 0.0–40.0)

## 2024-05-22 LAB — PSA, MEDICARE: PSA: 1.51 ng/mL (ref 0.10–4.00)

## 2024-05-22 LAB — HEMOGLOBIN A1C: Hgb A1c MFr Bld: 5.4 % (ref 4.6–6.5)

## 2024-05-22 LAB — TSH: TSH: 1.94 u[IU]/mL (ref 0.35–5.50)

## 2024-06-05 ENCOUNTER — Ambulatory Visit

## 2024-06-19 ENCOUNTER — Ambulatory Visit

## 2024-07-03 ENCOUNTER — Ambulatory Visit

## 2024-07-23 ENCOUNTER — Ambulatory Visit

## 2024-07-23 DIAGNOSIS — Z23 Encounter for immunization: Secondary | ICD-10-CM

## 2024-07-23 NOTE — Progress Notes (Signed)
 Patient received Shingles vaccine today with out complications.

## 2024-10-13 ENCOUNTER — Inpatient Hospital Stay

## 2024-10-13 ENCOUNTER — Inpatient Hospital Stay: Admitting: Hematology

## 2024-10-23 ENCOUNTER — Ambulatory Visit

## 2025-05-11 ENCOUNTER — Encounter: Admitting: Internal Medicine
